# Patient Record
Sex: Female | Born: 1960
Health system: Southern US, Community
[De-identification: ages and names within clinical notes are randomized; demographics above are authoritative.]

## PROBLEM LIST (undated history)

## (undated) DIAGNOSIS — K219 Gastro-esophageal reflux disease without esophagitis: Secondary | ICD-10-CM

## (undated) DIAGNOSIS — D649 Anemia, unspecified: Secondary | ICD-10-CM

## (undated) DIAGNOSIS — Z8659 Personal history of other mental and behavioral disorders: Secondary | ICD-10-CM

## (undated) DIAGNOSIS — K5792 Diverticulitis of intestine, part unspecified, without perforation or abscess without bleeding: Secondary | ICD-10-CM

## (undated) DIAGNOSIS — M1990 Unspecified osteoarthritis, unspecified site: Secondary | ICD-10-CM

## (undated) DIAGNOSIS — M545 Low back pain, unspecified: Secondary | ICD-10-CM

## (undated) DIAGNOSIS — R002 Palpitations: Secondary | ICD-10-CM

## (undated) DIAGNOSIS — C801 Malignant (primary) neoplasm, unspecified: Secondary | ICD-10-CM

## (undated) DIAGNOSIS — I10 Essential (primary) hypertension: Secondary | ICD-10-CM

## (undated) DIAGNOSIS — D759 Disease of blood and blood-forming organs, unspecified: Secondary | ICD-10-CM

## (undated) DIAGNOSIS — I499 Cardiac arrhythmia, unspecified: Secondary | ICD-10-CM

## (undated) HISTORY — PX: ABDOMINAL HYSTERECTOMY: SHX81

## (undated) HISTORY — DX: Low back pain: M54.5

## (undated) HISTORY — PX: COLONOSCOPY: SHX174

## (undated) HISTORY — DX: Diverticulitis of intestine, part unspecified, without perforation or abscess without bleeding: K57.92

## (undated) HISTORY — DX: Personal history of other mental and behavioral disorders: Z86.59

## (undated) HISTORY — DX: Palpitations: R00.2

## (undated) HISTORY — PX: OTHER SURGICAL HISTORY: SHX169

## (undated) HISTORY — DX: Anemia, unspecified: D64.9

## (undated) HISTORY — DX: Low back pain, unspecified: M54.50

---

## 1998-04-06 ENCOUNTER — Ambulatory Visit (HOSPITAL_COMMUNITY): Admission: RE | Admit: 1998-04-06 | Discharge: 1998-04-06 | Payer: Self-pay | Admitting: Internal Medicine

## 1998-10-10 ENCOUNTER — Other Ambulatory Visit: Admission: RE | Admit: 1998-10-10 | Discharge: 1998-10-10 | Payer: Self-pay | Admitting: *Deleted

## 1999-10-12 ENCOUNTER — Observation Stay (HOSPITAL_COMMUNITY): Admission: EM | Admit: 1999-10-12 | Discharge: 1999-10-12 | Payer: Self-pay | Admitting: Emergency Medicine

## 1999-10-12 ENCOUNTER — Encounter: Payer: Self-pay | Admitting: Emergency Medicine

## 1999-10-16 ENCOUNTER — Encounter: Payer: Self-pay | Admitting: *Deleted

## 1999-10-16 ENCOUNTER — Encounter: Admission: RE | Admit: 1999-10-16 | Discharge: 1999-10-16 | Payer: Self-pay | Admitting: *Deleted

## 1999-10-20 ENCOUNTER — Ambulatory Visit (HOSPITAL_COMMUNITY): Admission: RE | Admit: 1999-10-20 | Discharge: 1999-10-20 | Payer: Self-pay | Admitting: Obstetrics and Gynecology

## 1999-10-20 ENCOUNTER — Encounter: Payer: Self-pay | Admitting: Obstetrics and Gynecology

## 1999-12-07 ENCOUNTER — Encounter (INDEPENDENT_AMBULATORY_CARE_PROVIDER_SITE_OTHER): Payer: Self-pay | Admitting: Specialist

## 1999-12-07 ENCOUNTER — Inpatient Hospital Stay (HOSPITAL_COMMUNITY): Admission: RE | Admit: 1999-12-07 | Discharge: 1999-12-09 | Payer: Self-pay | Admitting: Obstetrics and Gynecology

## 2001-08-15 ENCOUNTER — Encounter: Payer: Self-pay | Admitting: Obstetrics and Gynecology

## 2001-08-15 ENCOUNTER — Encounter: Admission: RE | Admit: 2001-08-15 | Discharge: 2001-08-15 | Payer: Self-pay | Admitting: Obstetrics and Gynecology

## 2001-09-18 ENCOUNTER — Encounter: Payer: Self-pay | Admitting: Obstetrics and Gynecology

## 2001-09-18 ENCOUNTER — Ambulatory Visit (HOSPITAL_COMMUNITY): Admission: RE | Admit: 2001-09-18 | Discharge: 2001-09-18 | Payer: Self-pay | Admitting: Obstetrics and Gynecology

## 2002-02-02 ENCOUNTER — Ambulatory Visit (HOSPITAL_COMMUNITY): Admission: RE | Admit: 2002-02-02 | Discharge: 2002-02-02 | Payer: Self-pay | Admitting: Obstetrics and Gynecology

## 2002-02-02 ENCOUNTER — Encounter: Payer: Self-pay | Admitting: Obstetrics and Gynecology

## 2004-05-03 ENCOUNTER — Encounter: Admission: RE | Admit: 2004-05-03 | Discharge: 2004-05-03 | Payer: Self-pay | Admitting: Obstetrics and Gynecology

## 2004-09-09 ENCOUNTER — Emergency Department (HOSPITAL_COMMUNITY): Admission: EM | Admit: 2004-09-09 | Discharge: 2004-09-09 | Payer: Self-pay | Admitting: Emergency Medicine

## 2005-06-11 ENCOUNTER — Encounter: Admission: RE | Admit: 2005-06-11 | Discharge: 2005-06-11 | Payer: Self-pay | Admitting: Obstetrics and Gynecology

## 2005-09-20 ENCOUNTER — Ambulatory Visit (HOSPITAL_COMMUNITY): Admission: RE | Admit: 2005-09-20 | Discharge: 2005-09-20 | Payer: Self-pay | Admitting: Obstetrics and Gynecology

## 2005-11-02 ENCOUNTER — Ambulatory Visit: Payer: Self-pay | Admitting: Internal Medicine

## 2006-02-12 ENCOUNTER — Ambulatory Visit: Payer: Self-pay | Admitting: Internal Medicine

## 2006-06-13 ENCOUNTER — Encounter: Admission: RE | Admit: 2006-06-13 | Discharge: 2006-06-13 | Payer: Self-pay | Admitting: Obstetrics and Gynecology

## 2006-12-02 ENCOUNTER — Ambulatory Visit: Payer: Self-pay | Admitting: Internal Medicine

## 2006-12-02 LAB — CONVERTED CEMR LAB
Albumin: 3.8 g/dL (ref 3.5–5.2)
Alkaline Phosphatase: 61 units/L (ref 39–117)
BUN: 12 mg/dL (ref 6–23)
Basophils Relative: 1.9 % — ABNORMAL HIGH (ref 0.0–1.0)
Cholesterol: 203 mg/dL (ref 0–200)
Crystals: NEGATIVE
GFR calc Af Amer: 138 mL/min
HDL: 55.5 mg/dL (ref >39.0)
Ketones, ur: NEGATIVE mg/dL
Lymphocytes Relative: 32.8 % (ref 12.0–46.0)
Monocytes Relative: 5.3 % (ref 3.0–11.0)
Neutro Abs: 4.2 10*3/uL (ref 1.4–7.7)
Platelets: 461 10*3/uL — ABNORMAL HIGH (ref 150–400)
Potassium: 4.2 meq/L (ref 3.5–5.1)
RBC: 4.28 M/uL (ref 3.87–5.11)
RDW: 12.3 % (ref 11.5–14.6)
Specific Gravity, Urine: 1.025 (ref 1.000–1.03)
TSH: 0.35 microintl units/mL (ref 0.35–5.50)
Total CHOL/HDL Ratio: 3.7
Triglycerides: 56 mg/dL (ref 0–149)
Urine Glucose: NEGATIVE mg/dL
Urobilinogen, UA: 0.2 (ref 0.0–1.0)
VLDL: 11 mg/dL (ref 0–40)
WBC: 7.2 10*3/uL (ref 4.5–10.5)

## 2006-12-05 ENCOUNTER — Ambulatory Visit: Payer: Self-pay | Admitting: Internal Medicine

## 2006-12-09 ENCOUNTER — Ambulatory Visit: Payer: Self-pay | Admitting: Internal Medicine

## 2006-12-17 ENCOUNTER — Ambulatory Visit: Payer: Self-pay | Admitting: Internal Medicine

## 2007-06-23 ENCOUNTER — Encounter: Admission: RE | Admit: 2007-06-23 | Discharge: 2007-06-23 | Payer: Self-pay | Admitting: Obstetrics and Gynecology

## 2007-07-03 ENCOUNTER — Encounter: Admission: RE | Admit: 2007-07-03 | Discharge: 2007-07-03 | Payer: Self-pay | Admitting: Obstetrics and Gynecology

## 2007-09-02 ENCOUNTER — Ambulatory Visit: Payer: Self-pay | Admitting: Internal Medicine

## 2007-09-05 LAB — CONVERTED CEMR LAB
Bilirubin Urine: NEGATIVE
Specific Gravity, Urine: >=1.03 (ref 1.000–1.03)
Squamous Epithelial / LPF: NEGATIVE /lpf
pH: 6 (ref 5.0–8.0)

## 2007-09-17 ENCOUNTER — Ambulatory Visit: Payer: Self-pay | Admitting: Internal Medicine

## 2008-06-24 ENCOUNTER — Encounter: Admission: RE | Admit: 2008-06-24 | Discharge: 2008-06-24 | Payer: Self-pay | Admitting: Obstetrics and Gynecology

## 2008-08-30 ENCOUNTER — Encounter: Admission: RE | Admit: 2008-08-30 | Discharge: 2008-08-30 | Payer: Self-pay | Admitting: *Deleted

## 2009-06-25 DIAGNOSIS — R002 Palpitations: Secondary | ICD-10-CM

## 2009-06-25 HISTORY — DX: Palpitations: R00.2

## 2009-06-27 ENCOUNTER — Encounter: Admission: RE | Admit: 2009-06-27 | Discharge: 2009-06-27 | Payer: Self-pay | Admitting: Obstetrics and Gynecology

## 2010-03-15 ENCOUNTER — Ambulatory Visit: Payer: Self-pay | Admitting: Internal Medicine

## 2010-03-15 LAB — CONVERTED CEMR LAB
ALT: 11 units/L (ref 0–35)
Basophils Relative: 1.2 % (ref 0.0–3.0)
Bilirubin, Direct: 0.1 mg/dL (ref 0.0–0.3)
Chloride: 102 meq/L (ref 96–112)
Cholesterol: 213 mg/dL — ABNORMAL HIGH (ref 0–200)
Creatinine, Ser: 0.5 mg/dL (ref 0.4–1.2)
Eosinophils Absolute: 0.2 10*3/uL (ref 0.0–0.7)
HCT: 36.2 % (ref 36.0–46.0)
Leukocytes, UA: NEGATIVE
Lymphs Abs: 2.5 10*3/uL (ref 0.7–4.0)
MCHC: 34.5 g/dL (ref 30.0–36.0)
MCV: 89.4 fL (ref 78.0–100.0)
Monocytes Absolute: 1.1 10*3/uL — ABNORMAL HIGH (ref 0.1–1.0)
Neutro Abs: 11.6 10*3/uL — ABNORMAL HIGH (ref 1.4–7.7)
Neutrophils Relative %: 74.8 % (ref 43.0–77.0)
Nitrite: NEGATIVE
Potassium: 4.7 meq/L (ref 3.5–5.1)
RBC: 4.05 M/uL (ref 3.87–5.11)
Specific Gravity, Urine: <=1.005 (ref 1.000–1.030)
Total Bilirubin: 0.7 mg/dL (ref 0.3–1.2)
Total CHOL/HDL Ratio: 4
Urobilinogen, UA: 0.2 (ref 0.0–1.0)
pH: 6 (ref 5.0–8.0)

## 2010-03-17 ENCOUNTER — Ambulatory Visit: Payer: Self-pay | Admitting: Internal Medicine

## 2010-03-17 DIAGNOSIS — D72829 Elevated white blood cell count, unspecified: Secondary | ICD-10-CM | POA: Insufficient documentation

## 2010-04-24 ENCOUNTER — Ambulatory Visit: Payer: Self-pay | Admitting: Internal Medicine

## 2010-04-24 LAB — CONVERTED CEMR LAB
Basophils Relative: 0.9 % (ref 0.0–3.0)
Eosinophils Absolute: 0.1 10*3/uL (ref 0.0–0.7)
Eosinophils Relative: 1.2 % (ref 0.0–5.0)
Lymphocytes Relative: 28.7 % (ref 12.0–46.0)
Neutrophils Relative %: 64.2 % (ref 43.0–77.0)
Platelets: 400 10*3/uL (ref 150.0–400.0)
RBC: 4.11 M/uL (ref 3.87–5.11)
WBC: 9.1 10*3/uL (ref 4.5–10.5)

## 2010-04-28 ENCOUNTER — Ambulatory Visit: Payer: Self-pay | Admitting: Internal Medicine

## 2010-05-25 DIAGNOSIS — C4490 Unspecified malignant neoplasm of skin, unspecified: Secondary | ICD-10-CM | POA: Insufficient documentation

## 2010-06-29 ENCOUNTER — Encounter
Admission: RE | Admit: 2010-06-29 | Discharge: 2010-06-29 | Payer: Self-pay | Source: Home / Self Care | Attending: Internal Medicine | Admitting: Internal Medicine

## 2010-07-13 ENCOUNTER — Ambulatory Visit
Admission: RE | Admit: 2010-07-13 | Discharge: 2010-07-13 | Payer: Self-pay | Source: Home / Self Care | Attending: Internal Medicine | Admitting: Internal Medicine

## 2010-07-13 DIAGNOSIS — M25519 Pain in unspecified shoulder: Secondary | ICD-10-CM | POA: Insufficient documentation

## 2010-07-13 DIAGNOSIS — S20219A Contusion of unspecified front wall of thorax, initial encounter: Secondary | ICD-10-CM | POA: Insufficient documentation

## 2010-07-16 ENCOUNTER — Encounter: Payer: Self-pay | Admitting: Obstetrics and Gynecology

## 2010-07-16 ENCOUNTER — Encounter: Payer: Self-pay | Admitting: Podiatry
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
## 2010-07-27 NOTE — Assessment & Plan Note (Signed)
Summary: 6 wk f/u /cd   Vital Signs:  Patient profile:   50 year old female Height:      68 inches Weight:      177 pounds BMI:     27.01 Temp:     98.7 degrees F oral Pulse rate:   72 / minute Pulse rhythm:   regular Resp:     16 per minute BP sitting:   120 / 70  (left arm) Cuff size:   regular  Vitals Entered By: Lanier Prude, CMA(AAMA) (April 28, 2010 3:21 PM) CC: 6 wk f/u Is Patient Diabetic? No   CC:  6 wk f/u.  History of Present Illness: F/u elev WBC  Allergies: 1)  ! Pcn 2)  * Fish Oil  Past History:  Past Medical History: Last updated: 03/17/2010 Low back pain h/o bulimia Palpitations 2011 [facility] Dr Mysinger  Social History: Last updated: 03/17/2010 Occupation: [employer] - works from home Married 2 children Regular exercise-: no Never Smoked  Review of Systems  The patient denies fever, chest pain, dyspnea on exertion, and abdominal pain.    Physical Exam  General:  Well-developed,well-nourished,in no acute distress; alert,appropriate and cooperative throughout examination Mouth:  Oral mucosa and oropharynx without lesions or exudates.  Teeth in good repair. Abdomen:  Bowel sounds positive,abdomen soft and non-tender without masses, organomegaly or hernias noted. Msk:  No deformity or scoliosis noted of thoracic or lumbar spine.   Neurologic:  No cranial nerve deficits noted. Station and gait are normal. Plantar reflexes are down-going bilaterally. DTRs are symmetrical throughout. Sensory, motor and coordinative functions appear intact.   Impression & Recommendations:  Problem # 1:  LEUKOCYTOSIS (ICD-288.60) resolved Assessment Improved The labs were reviewed with the patient.   Complete Medication List: 1)  Vitamin D3 1000 Unit Tabs (Cholecalciferol) .[name].. 1 by mouth daily 2)  Atenolol 50 Mg Tabs (Atenolol) .[name].. 1 once daily  Patient Instructions: 1)  Please schedule a follow-up appointment in 1 year well  w/labs.   Orders Added: 1)  Est. Patient Level III [11914]

## 2010-07-27 NOTE — Assessment & Plan Note (Signed)
Summary: PHYSICAL--STC   Vital Signs:  Patient profile:   50 year old female Height:      68 inches Weight:      178 pounds BMI:     27.16 Pulse rate:   76 / minute Pulse rhythm:   regular Resp:     16 per minute BP sitting:   110 / 80  (left arm) Cuff size:   regular  Vitals Entered By: Lanier Prude, Beverly Gust) (March 17, 2010 2:22 PM) CC: CPX Is Patient Diabetic? No Comments pt had EKG recently.     CC:  CPX.  History of Present Illness: The patient presents for a preventive health examination She went to UC once 6 months ago for palpitations and was put on Atenolol. Then she went to see an [facility] cardiologist and had an ECHO - it was nl per pt.   Current Medications (verified): 1)  Naprosyn 500 Mg Tabs (Naproxen) .[name].. 1 Two Times A Day Pc Prn 2)  Vitamin D3 1000 Unit  Tabs (Cholecalciferol) .[name].. 1 By Mouth Daily 3)  Vitamin E 1000 Unit Caps (Vitamin E) .[name].. 1 Once Daily 4)  Calcium 500+d 500-200 Mg-Unit Tabs (Calcium Carbonate-Vitamin D) .[name].. 1 Once Daily 5)  Fish Oil 1000 Mg Caps (Omega-3 Fatty Acids) .[name].. 1 Once Daily 6)  Atenolol 50 Mg Tabs (Atenolol) .[name].. 1 Once Daily  Allergies (verified): 1)  ! Pcn 2)  * Fish Oil  Past History:  Family History: Last updated: 09/02/2007 Family History Hypertension M - thyroid CA  Past Medical History: Low back pain h/o bulimia Palpitations 2011 [facility] Dr Mysinger  Past Surgical History: Hysterectomy partial Colonoscopy Dr Madilyn Fireman 2011 nl  Social History: Occupation: [employer] - works from home Married 2 children Regular exercise-: no Never Smoked  Review of Systems  The patient denies anorexia, fever, weight loss, weight gain, vision loss, decreased hearing, hoarseness, chest pain, syncope, dyspnea on exertion, peripheral edema, prolonged cough, headaches, hemoptysis, abdominal pain, melena, hematochezia, severe indigestion/heartburn, hematuria, incontinence, genital sores, muscle weakness,  suspicious skin lesions, transient blindness, difficulty walking, depression, unusual weight change, abnormal bleeding, enlarged lymph nodes, angioedema, and breast masses.    Physical Exam  General:  Well-developed,well-nourished,in no acute distress; alert,appropriate and cooperative throughout examination Head:  Normocephalic and atraumatic without obvious abnormalities. No apparent alopecia or balding. Eyes:  No corneal or conjunctival inflammation noted. EOMI. Perrla. Ears:  External ear exam shows no significant lesions or deformities.  Otoscopic examination reveals clear canals, tympanic membranes are intact bilaterally without bulging, retraction, inflammation or discharge. Hearing is grossly normal bilaterally. Nose:  External nasal examination shows no deformity or inflammation. Nasal mucosa are pink and moist without lesions or exudates. Mouth:  Oral mucosa and oropharynx without lesions or exudates.  Teeth in good repair. Neck:  No deformities, masses, or tenderness noted. Lungs:  Normal respiratory effort, chest expands symmetrically. Lungs are clear to auscultation, no crackles or wheezes. Heart:  Normal rate and regular rhythm. S1 and S2 normal without gallop, murmur, click, rub or other extra sounds. Abdomen:  Bowel sounds positive,abdomen soft and non-tender without masses, organomegaly or hernias noted. Msk:  No deformity or scoliosis noted of thoracic or lumbar spine.   Extremities:  No clubbing, cyanosis, edema, or deformity noted with normal full range of motion of all joints.   Neurologic:  No cranial nerve deficits noted. Station and gait are normal. Plantar reflexes are down-going bilaterally. DTRs are symmetrical throughout. Sensory, motor and coordinative functions appear  intact. Skin:  Intact without suspicious lesions or rashes Cervical Nodes:  No lymphadenopathy noted Inguinal Nodes:  No significant adenopathy Psych:  Cognition and judgment appear intact. Alert and  cooperative with normal attention span and concentration. No apparent delusions, illusions, hallucinations   Impression & Recommendations:  Problem # 1:  HEALTH MAINTENANCE EXAM (ICD-V70.0) Assessment New Health and age related issues were discussed. Available screening tests and vaccinations were discussed as well. Healthy life style including good diet and exercise was discussed. The labs were reviewed with the patient. Colonosc. 2011 and mammo  q1-2 years discussed  Problem # 2:  LEUKOCYTOSIS (ICD-288.60) unclear etiol Assessment: New Recheck soon Orders: T-2 View CXR, Same Day (71020.5TC)  Complete Medication List: 1)  Vitamin D3 1000 Unit Tabs (Cholecalciferol) .[name].. 1 by mouth daily 2)  Atenolol 50 Mg Tabs (Atenolol) .[name].. 1 once daily 3)  Zithromax Z-pak 250 Mg Tabs (Azithromycin) .... As dirrected  Other Orders: Admin 1st Vaccine (04540) Flu Vaccine 51yrs + (98119)  Patient Instructions: 1)  Please schedule a follow-up appointment in 6 weeks. 2)  CBC w/ Diff prior to visit, ICD-9:995.20 Prescriptions: ATENOLOL 50 MG TABS (ATENOLOL) 1 once daily  #30 x 12   Entered and Authorized by:   Tresa Garter MD   Signed by:   Tresa Garter MD on 03/17/2010   Method used:   Print then Give to Patient   RxID:   [id number] QIONGEXBM Z-PAK 250 MG TABS (AZITHROMYCIN) as dirrected  #1 x 0   Entered and Authorized by:   Tresa Garter MD   Signed by:   Tresa Garter MD on 03/17/2010   Method used:   Print then Give to Patient   RxID:   [id number]    Preventive Care Screening  Colonoscopy:    Date:  12/23/2009    Results:  normal   .lbflu   Flu Vaccine Consent Questions     Do you have a history of severe allergic reactions to this vaccine? no    Any prior history of allergic reactions to egg and/or gelatin? no    Do you have a sensitivity to the preservative Thimersol? no    Do you have a past history of Guillan-Barre Syndrome? no    Do you  currently have an acute febrile illness? no    Have you ever had a severe reaction to latex? no    Vaccine information given and explained to patient? yes    Are you currently pregnant? no    Lot Number:[id number]   Exp Date:12/23/2010   Site Given  Left Deltoid IM Lanier Prude, [facility])  March 17, 2010 2:33 PM

## 2010-11-07 NOTE — Assessment & Plan Note (Signed)
[facility]                           PRIMARY CARE OFFICE NOTE   NAME:Sheila Lin, Sheila Lin                   MRN:          [id number]  DATE:12/05/2006                            DOB:          [birth year]    PROCEDURE NOTE:   PROCEDURE:  Laceration repair.   INDICATIONS:  Blunt injury to the right forefoot, dorsal side, against  third toe, resulting in skin laceration.   The risks, including infection, scar formation, bleeding and others, as  well as benefits, were explained to the patient in detail.  She agreed  to proceed.  The skin was treated with Betadine and alcohol and the  wound was injected with 3 mL of 2% lidocaine with epinephrine.  After  adequate anesthesia was obtained, the wound was copiously irrigated with  anesthetic solution through the syringe and debrided manually with  sterile gauze.  After that, the wound was prepped with Betadine and  draped with a fenestrated sterile dressing.  Two 3-0 Ethilon sutures  were applied to hold the flap to the rest of the skin.  The base of the  wound did not show any tendons or other structures, except for  subcutaneous tissue visible.  The wound was closed with simple sutures.  Applied a large Band-Aid, gauze and Ace wrap.   Wound instructions were provided.  She will apply Silvadene cream when  she gets to the drug store.  Tetanus shot given.   Followup in twelve days to remove sutures.  Call if problems.     [name]. [name], MD  Electronically Signed    AVP/MedQ  DD: 12/06/2006  DT: 12/06/2006  Job #: [id number]

## 2010-11-07 NOTE — Assessment & Plan Note (Signed)
[facility]                           PRIMARY CARE OFFICE NOTE   NAME:Sheila Lin, Sheila Lin                   MRN:          [id number]  DATE:12/09/2006                            DOB:          [birth year]    The patient is a 50 year old female who presents for a wellness  examination.   PAST MEDICAL HISTORY:  As per February 03, 2002 note.   FAMILY HISTORY:  As per February 03, 2002 note.   SOCIAL HISTORY:  As per February 03, 2002 note.  She has been playing in a  baseball league for a long time, weekly practices and games.  She is an  empty nester now, going through some midlife crisis problems.   ALLERGIES:  PENICILLIN.   REVIEW OF SYSTEMS:  Right foot is doing better after injuring it.  Denies being depressed.  States that she is somewhat bored.  No chest  pain or shortness of breath.  No neurologic complaints.  Denies  insomnia.  No hot flashes.  The rest of the 18-point review of systems  is negative.   PHYSICAL EXAMINATION:  Blood pressure 123/73.  Pulse 74.  Temperature  99.7.  Weight is 186 pounds.  She is looks well.  She is no acute distress.  HEENT:  With moist mucosa.  NECK:  Supple.  No thyromegaly or bruit.  LUNGS:  Clear to auscultation and percussion.  No wheezing or rales.  HEART:  No murmur.  No gallop.  ABDOMEN:  Soft and non-tender.  No organomegaly or mass felt.  LOWER EXTREMITIES:  Without edema.  She is alert and appropriate.  Denies being depressed.  RIGHT FOOT:  Examination reveals a repaired laceration healing well.  Swelling is down.  Pulse is normal.  NEUROLOGIC EXAM:  Nonfocal.  Normal.  Labs on December 02, 2006:  CBC normal.  CMET normal.  Cholesterol 203, HDL  55.5, LDL 128.  Urinalysis normal.  X-ray of the foot without fracture.  TSH normal.  EKG today was normal sinus rhythm.   ASSESSMENT AND PLAN:  1. Normal wellness examination.  Age/health issues discussed.  Healthy      lifestyle discussed.  Continue with  baseball when the foot feels      better.  She has been seeing a dermatologist twice a year to check      on her moles.  GYN checkups yearly.  Mammogram yearly.  All      vaccinations are up to date.  She can take vitamin D 1000 mg daily      during the winter.      Counselling re: midlife issues adviced.  I will see her back in      12 months.  2. Contusion/laceration right foot.  She will be here next week for      suture removal.     Aleksei V. [name], MD  Electronically Signed    AVP/MedQ  DD: 12/10/2006  DT: 12/10/2006  Job #: [id number]

## 2010-11-10 NOTE — H&P (Signed)
[facility]  Patient:    Sheila Lin, Sheila Lin                   MRN: [id number] Adm. Date:  [id number] Disc. Date: [id number] Attending:  Heber [place]                         History and Physical  CHIEF COMPLAINT:  Symptomatic leiomyomata uterus.  HISTORY OF PRESENT ILLNESS:  This is a 50 year old white female gravida 3, para 2-0-1-2 with a last menstrual period of late May whom I saw in April as a new patient. At that time she complained of since August of 1999 having heavy crampy menses. She was told in October 1999 that she had 3 fibroids and her bleeding and cramping have continued since then. She had a D&C performed April of 2000 which corrected intermenstrual bleeding but she still had heavy crampy menses. She has been on oral contraceptives for approximately 12 years without improvement. She now has heavy unpredictable bleeding with significant cramping and has had no relief from over the counter Midol, ibuprofen or Aleve. The patient works at home and says that she would miss work if she did not work at home and she has to carry pads at all times due to the unpredictable bleeding. The bleeding and pain interferes with her daily activities including playing softball, camping and other daily activities. She also complains of a vaginal discharge with odor and dyspareunia for 6-7 months.  Preop evaluation included an ultrasound which revealed multiple uterine fibroids. The largest is posterior near the fundus on the right and measures 4 x 5 x 5 cm. She has another prominent fibroid on the left measuring 4 x 5 x 5 cm. A smaller fibroid in the anterolateral aspect of the uterus measures 2.7 cm and a fourth fibroid measuring 1.7 cm. The left ovary was normal. The right ovary was not identified. The patient is admitted for definitive surgical therapy.  PAST OB HISTORY:  In 1981, vaginal delivery 8 pounds 8 ounces without complications. In  1998, she had an ectopic pregnancy treated with a salpingectomy through a laparotomy. She has 1 other vaginal delivery without complications.  PAST MEDICAL HISTORY:  Negative.  PAST SURGICAL HISTORY:  Significant for salpingectomy in 1998 for her ectopic pregnancy. She is unsure which side. She had the [facility] in May of 2000.  CURRENT MEDICATIONS:  Loestrin 1.530 and iron.  ALLERGIES:  PENICILLIN  which causes hives and a rash.  GYN HISTORY:  No significant history. No abnormal Pap smears.  REVIEW OF SYSTEMS:  Otherwise negative.  FAMILY HISTORY:  Noncontributory.  PHYSICAL EXAMINATION:  VITAL SIGNS:  Weight is 178 pounds, blood pressure was 110/70.  GENERAL:  She is a well-developed, well-nourished, white female who is in no acute distress.  HEENT:  Pupils equal round and reactive to light and accommodation and her extraocular muscles are intact. Oropharynx is clear without erythema or exudates.  NECK:  Supple without lymphadenopathy or thyromegaly.  LUNGS:  Clear to auscultation.  HEART:  Regular rate and rhythm without murmurs.  ABDOMEN:  Soft, nontender and she has a well healed transverse scar. She does have myomas palpable just above the pubic symphysis.  EXTREMITIES:  No edema, are nontender and DTRs are 2/4 and symmetric.  PELVIC:  External genitalia revealed no lesions. On bimanual exam, she has an enlarged irregular uterus with palpable fibroids at the right fundus.  ASSESSMENT:  Symptomatic  leiomyomata uterus. Medical and surgical options have been discussed with the patient. She has not responded to oral contraceptives or nonsteroidal anti-inflammatories. The patient desires definitive surgical therapy. Risks of surgery including bleeding, infection, and damage to surrounding organs have been discussed. We have also elected to preserve her ovaries unless they appear abnormal.  PLAN:  Admit the patient on the day of surgery for a total abdominal hysterectomy  with possible bilateral salpingo-oophorectomy. DD:  12/06/99 TD:  12/06/99 Job: [id number]

## 2010-11-10 NOTE — Discharge Summary (Signed)
[facility]  Patient:    Sheila Lin, Sheila Lin                   MRN: [id number] Adm. Date:  [id number] Disc. Date: [id number] Attending:  Michaele Offer                           Discharge Summary  ADMISSION DIAGNOSIS:  Symptomatic leiomyomatous uterus.  DISCHARGE DIAGNOSIS:  Symptomatic leiomyomatous uterus.  PROCEDURE PERFORMED:  Total abdominal hysterectomy.  COMPLICATIONS:  None.  CONSULTATIONS:  None.  HISTORY OF PRESENT ILLNESS:  Briefly, this is a 50 year old white female gravida 3, para 2-0-1-2 with irregular heavy periods and significant menstrual cramping which has not responded to medical therapy.  She has an enlarged lieomyomatous uterus and is admitted for definitive surgical therapy.  PAST MEDICAL HISTORY:  Past history is significant for two vaginal deliveries and one ectopic pregnancy.  The ectopic pregnancy was treated with a left salpingectomy via a laparotomy.  She has also had a D&C for her abnormal bleeding.  PHYSICAL EXAMINATION:  Physical examination was significant for benign abdomen with a well-healed transverse scar.  She had a 10-weeks size enlarged, irregular uterus with palpable fibroids.  HOSPITAL COURSE:  The patient was admitted on the day of surgery and underwent a total abdominal hysterectomy under general anesthesia.  Estimated blood loss was 200 cc.  She did have an enlarged, irregular, myomatous uterus with normal ovaries and evidence of a previous left salpingectomy.  Postoperatively she did very well, remained afebrile and was rapidly ambulated.  She tolerated liquids, followed by a regular diet.  She did have some problems with tolerating Percocet, but was able to tolerate this on the morning of discharge.  On postoperative day #2, her incision was healing well and at this time she was felt to be stable enough for discharge home.  Preoperative hemoglobin was 11.0, postoperative hemoglobin was  9.8.  CONDITION ON DISCHARGE:  Stable.  DISPOSITION:  Discharged to home.  DISCHARGE INSTRUCTIONS:  Her diet is a regular diet.  Her activity is pelvic rest, no strenuous activity, no driving.  Followup is in approximately 4 days for staple removal.  DISCHARGE MEDICATIONS:  Percocet p.r.n. pain. DD:  12/09/99 TD:  12/12/99 Job: [id number]

## 2010-11-10 NOTE — H&P (Signed)
[place]  Patient:    Sheila Lin, Sheila Lin                   MRN: [id number] Adm. Date:  [id number] Attending:  Heber Cobden                         History and Physical  CHIEF COMPLAINT:  Chest pain.  HISTORY OF PRESENT ILLNESS:  This 50 year old white female states she was awakened from sleep with left arm numbness and pain as well as chest pain associated with shortness of breath.  She states the symptoms lasted approximately 10 to 15 minutes before she was brought to the hospital for evaluation.  Once in the hospital, she was seen and evaluated by the emergency room physician, who felt admission was warranted to rule out cardiac event.  Patient denies any past history of any cardiac disease, hypertension, diabetes or GI problems.  PAST FAMILY MEDICAL HISTORY:  The patient was born and raised in [place]. Mother and father are alive and well and have no history of cardiac disease. She has one sister who is healthy.  Patient is married and has two children.  SOCIAL HISTORY:  The patient does not drink or smoke.  PAST SURGICAL HISTORY:  D&C and ectopic pregnancy.  MEDICATIONS AT PRESENT:  Birth control pills.  ALLERGIES:  PENICILLIN.  LOCAL PHYSICIAN:  Dr. Rosalyn Gess. Norins.  REVIEW OF SYSTEMS:  HEENT, GI, GU, cardiac and pulmonary reviews all negative.  GYN: It is known that the patient has fibroids and is followed by Dr. Roberto Scales.  PHYSICAL EXAMINATION:  VITAL SIGNS:  Blood pressure is 136/70.  Pulse is 80.  Respirations 16.  GENERAL APPEARANCE:  Middle-aged white female in no acute distress, sitting on he emergency room stretcher.  HEENT:  Head was normocephalic and atraumatic.  Eyes:  Conjunctivae were clear.  Pupils equal, round and reactive to light.  Nose:  Moist patent passageways. Pharynx is clear.  Ears:  Canals clear.  TMs gray.  NECK:  Supple.  No adenopathy or thyromegaly.  No nodules.  CHEST:   Totally clear to auscultation.  HEART:  Regular rhythm.  No murmurs.  Normal S1 and S2.  No pain to palpation.   ABDOMEN:  Negative.  EXTREMITIES:  No edema.  NEUROLOGIC:  Patients reflexes are flat.  She moves all extremities well.  BREASTS:  Deferred.  PELVIC:  Deferred.  RECTAL:  Deferred.  LABORATORY FINDINGS:  Labs are pending.  EKG is totally within normal limits.  ASSESSMENT:  Chest pain, rule out cardiac versus gastrointestinal or musculoskeletal.  PLAN:  Patient will be admitted.  Cardiac isoenzymes x 3 at eight-hour intervals. DD:  10/12/99 TD:  10/12/99 Job: [id number]

## 2010-11-10 NOTE — Discharge Summary (Signed)
[place]  Patient:    Sheila Lin, Sheila Lin                   MRN: [id number] Adm. Date:  [id number] Disc. Date: [id number] Attending:  Heber Chiloquin Dictator:   Cornell Barman, P.A. CC:         Rosalyn Gess. Norins, M.D. [facility]                           Discharge Summary  BRIEF ADMISSION HISTORY:  The patient is a 50 year old white female that presented after awakening from sleep with pain in her left arm, shortness of breath and chest pressure.  The symptoms lasted 10-15 minutes.  She was brought to the emergency room for evaluation.  She denied any prior cardiac problems.  She denies any history of hypertension or GI pathology.  Her only medication was birth control pills.  The patient was admitted to rule out myocardial infarction.  HOSPITAL COURSE:  Myocardial infarction was ruled out with negative cardiac enzymes.  The patient was discharged to home.  She was instructed to follow up Monday, May 23 at 1:00 p.m. for a stress Cardiolite.  She was instructed to resume her prior home medications. DD:  10/23/99 TD:  10/24/99 Job: [id number]

## 2010-11-10 NOTE — Op Note (Signed)
[facility]  Patient:    Sheila Lin, Sheila Lin                   MRN: [id number] Proc. Date: 12/07/99 Adm. Date:  [id number] Attending:  Michaele Offer                           Operative Report  PREOPERATIVE DIAGNOSIS:  Symptomatic leiomyomata uterus.  POSTOPERATIVE DIAGNOSIS:  Symptomatic leiomyomata uterus.  PROCEDURE:  Total abdominal hysterectomy.  SURGEON:  Zenaida Niece, M.D.  ASSISTANT:  Huel Cote, M.D. and Michael Litter, P.A. (student).  ANESTHESIA:  General endotracheal tube.  ESTIMATED BLOOD LOSS:  200 cc.  CHEMOPROPHYLAXIS:  Ancef 1 gm preop.  FINDINGS:  Enlarged irregular myomatous uterus with several myomas. She had normal appearing ovaries and evidence of a previous left salpingectomy from an ectopic pregnancy.  COUNTS:  Correct.  CONDITION:  Stable.  DESCRIPTION OF PROCEDURE:  After appropriate informed consent was obtained, the patient was taken in the operating room and placed in the dorsal supine position. General anesthesia was induced and she was prepped and draped in the usual sterile fashion and a Foley catheter inserted. Her abdomen was then entered via her previous Pfannenstiel scar. A self retaining retractor was placed and the bowels were packed out of the pelvis. The uterus again was noted to be enlarged and irregular with several palpable fibroids. The uterine cornu were grasped with Kelly clamps. The round ligaments were divided with electrocautery and the utero-ovarian pedicles were clamped with Zeppelin clamps. These were then transected and doubly ligated with #1 chromic to free up the ovaries. The broad ligaments were transected with electrocautery and taken across the anterior portion of the uterus and the bladder was pushed inferiorly. The uterine arteries were skeletonized, clamped, transected and ligated with #1 chromic. Likewise the cardinal ligaments and uterosacral ligaments were  clamped, transected and ligated with #1 chromic. The vaginal angles were then clamped with Zeppelin clamps and the vagina was entered sharply. The vagina was cut with scissors to remove the uterus and cervix. The vaginal angles were sutured with #1 chromic and the remainder of the vagina was closed in a vertical fashion from anterior to posterior with running locking #1 chromic with adequate hemostasis. The pelvis was then irrigated and found to be hemostatic. The uterosacral ligaments were plicated in the midline with a suture of 2-0 silk. All sites were inspected and found to be hemostatic. The lap pad was removed from packing the bowel out of the abdomen and all instruments removed. The subfascial space was then irrigated and made hemostatic with electrocautery. The fascia was closed in a running fashion starting at both ends and meeting in the middle with #0 Vicryl. The subcutaneous tissue was irrigated and made hemostatic with electrocautery and the skin was closed with staples followed by a sterile dressing. The patient was extubated in the operating room, tolerated the procedure well and taken to the recovery room in stable condition. DD:  12/07/99 TD:  12/09/99 Job: [id number]

## 2011-02-20 ENCOUNTER — Other Ambulatory Visit: Payer: Self-pay | Admitting: *Deleted

## 2011-02-20 DIAGNOSIS — Z0000 Encounter for general adult medical examination without abnormal findings: Secondary | ICD-10-CM

## 2011-03-23 ENCOUNTER — Other Ambulatory Visit (INDEPENDENT_AMBULATORY_CARE_PROVIDER_SITE_OTHER): Payer: Self-pay

## 2011-03-23 DIAGNOSIS — Z0000 Encounter for general adult medical examination without abnormal findings: Secondary | ICD-10-CM

## 2011-03-23 LAB — BASIC METABOLIC PANEL
BUN: 9 mg/dL (ref 6–23)
Calcium: 9.2 mg/dL (ref 8.4–10.5)
Creatinine, Ser: 0.5 mg/dL (ref 0.4–1.2)
GFR: 131.76 mL/min (ref >60.00)
Glucose, Bld: 94 mg/dL (ref 70–99)
Potassium: 4.3 mEq/L (ref 3.5–5.1)

## 2011-03-23 LAB — CBC WITH DIFFERENTIAL/PLATELET
Basophils Absolute: 0 10*3/uL (ref 0.0–0.1)
Eosinophils Relative: 1.3 % (ref 0.0–5.0)
HCT: 34 % — ABNORMAL LOW (ref 36.0–46.0)
Lymphocytes Relative: 30.9 % (ref 12.0–46.0)
Lymphs Abs: 2.9 10*3/uL (ref 0.7–4.0)
Monocytes Relative: 5.4 % (ref 3.0–12.0)
Platelets: 314 10*3/uL (ref 150.0–400.0)
RDW: 13.1 % (ref 11.5–14.6)
WBC: 9.3 10*3/uL (ref 4.5–10.5)

## 2011-03-23 LAB — TSH: TSH: 0.43 u[IU]/mL (ref 0.35–5.50)

## 2011-03-23 LAB — LIPID PANEL
Cholesterol: 182 mg/dL (ref 0–200)
HDL: 64.1 mg/dL (ref >39.00)
LDL Cholesterol: 110 mg/dL — ABNORMAL HIGH (ref 0–99)
Triglycerides: 42 mg/dL (ref 0.0–149.0)
VLDL: 8.4 mg/dL (ref 0.0–40.0)

## 2011-03-23 LAB — URINALYSIS, ROUTINE W REFLEX MICROSCOPIC
Nitrite: NEGATIVE
Specific Gravity, Urine: <=1.005 (ref 1.000–1.030)
Total Protein, Urine: NEGATIVE
Urine Glucose: NEGATIVE
Urobilinogen, UA: 0.2 (ref 0.0–1.0)

## 2011-03-23 LAB — HEPATIC FUNCTION PANEL
AST: 15 U/L (ref 0–37)
Albumin: 4.1 g/dL (ref 3.5–5.2)
Alkaline Phosphatase: 48 U/L (ref 39–117)

## 2011-03-28 ENCOUNTER — Encounter: Payer: Self-pay | Admitting: Internal Medicine

## 2011-03-30 ENCOUNTER — Encounter: Payer: Self-pay | Admitting: Internal Medicine

## 2011-03-30 ENCOUNTER — Ambulatory Visit (INDEPENDENT_AMBULATORY_CARE_PROVIDER_SITE_OTHER): Payer: Commercial Managed Care - PPO | Admitting: Internal Medicine

## 2011-03-30 VITALS — BP 120/70 | HR 80 | Temp 98.6°F | Resp 16 | Ht 69.0 in | Wt 160.0 lb

## 2011-03-30 DIAGNOSIS — D649 Anemia, unspecified: Secondary | ICD-10-CM | POA: Insufficient documentation

## 2011-03-30 DIAGNOSIS — Z0000 Encounter for general adult medical examination without abnormal findings: Secondary | ICD-10-CM | POA: Insufficient documentation

## 2011-03-30 MED ORDER — ATENOLOL 50 MG PO TABS: 50.0000 mg | ORAL_TABLET | Freq: Every day | ORAL | Status: DC

## 2011-03-30 NOTE — Assessment & Plan Note (Signed)
We discussed age appropriate health related issues, including available/recomended screening tests and vaccinations. We discussed a need for adhering to healthy diet and exercise. Labs/EKG were reviewed/ordered. All questions were answered.   

## 2011-03-30 NOTE — Progress Notes (Signed)
  Subjective:    Patient ID: Sheila Lin, female    DOB: Dec 28, 1960, 50 y.o.   MRN: [id number]  HPI  The patient is here for a wellness exam. The patient has been doing well overall without major physical or psychological issues going on lately.Review of Systems  Constitutional: Negative for fever, chills, diaphoresis, activity change, appetite change, fatigue and unexpected weight change.  HENT: Negative for hearing loss, ear pain, congestion, sore throat, sneezing, mouth sores, neck pain, dental problem, voice change, postnasal drip and sinus pressure.   Eyes: Negative for pain and visual disturbance.  Respiratory: Negative for cough, chest tightness, wheezing and stridor.   Cardiovascular: Negative for chest pain, palpitations and leg swelling.  Gastrointestinal: Negative for nausea, vomiting, abdominal pain, blood in stool, abdominal distention and rectal pain.  Genitourinary: Negative for dysuria, hematuria, decreased urine volume, vaginal bleeding, vaginal discharge, difficulty urinating, vaginal pain and menstrual problem.  Musculoskeletal: Negative for back pain, joint swelling and gait problem.  Skin: Negative for color change, rash and wound.  Neurological: Negative for dizziness, tremors, syncope, speech difficulty and light-headedness.  Hematological: Negative for adenopathy.  Psychiatric/Behavioral: Negative for suicidal ideas, hallucinations, behavioral problems, confusion, sleep disturbance, dysphoric mood and decreased concentration. The patient is not hyperactive.        Objective:   Physical Exam  Constitutional: She appears well-developed and well-nourished. No distress.  HENT:  Head: Normocephalic.  Right Ear: External ear normal.  Left Ear: External ear normal.  Nose: Nose normal.  Mouth/Throat: Oropharynx is clear and moist.  Eyes: Conjunctivae are normal. Pupils are equal, round, and reactive to light. Right eye exhibits no discharge. Left eye exhibits no  discharge.  Neck: Normal range of motion. Neck supple. No JVD present. No tracheal deviation present. No thyromegaly present.  Cardiovascular: Normal rate, regular rhythm and normal heart sounds.   Pulmonary/Chest: No stridor. No respiratory distress. She has no wheezes.  Abdominal: Soft. Bowel sounds are normal. She exhibits no distension and no mass. There is no tenderness. There is no rebound and no guarding.  Musculoskeletal: She exhibits no edema and no tenderness.  Lymphadenopathy:    She has no cervical adenopathy.  Neurological: She displays normal reflexes. No cranial nerve deficit. She exhibits normal muscle tone. Coordination normal.  Skin: No rash noted. No erythema.  Psychiatric: She has a normal mood and affect. Her behavior is normal. Judgment and thought content normal.     Lab Results  Component Value Date   WBC 9.3 03/23/2011   HGB 11.2* 03/23/2011   HCT 34.0* 03/23/2011   PLT 314.0 03/23/2011   GLUCOSE 94 03/23/2011   CHOL 182 03/23/2011   TRIG 42.0 03/23/2011   HDL 64.10 03/23/2011   LDLDIRECT 156.2 03/15/2010   LDLCALC 110* 03/23/2011   ALT 14 03/23/2011   AST 15 03/23/2011   NA 140 03/23/2011   K 4.3 03/23/2011   CL 105 03/23/2011   CREATININE 0.5 03/23/2011   BUN 9 03/23/2011   CO2 29 03/23/2011   TSH 0.43 03/23/2011        Assessment & Plan:

## 2011-03-30 NOTE — Assessment & Plan Note (Addendum)
Continue with current prescription therapy as reflected on the Med list. Labs sooner if she starts to tired etc Hem cards given - now

## 2011-07-18 ENCOUNTER — Other Ambulatory Visit: Payer: Self-pay | Admitting: Obstetrics and Gynecology

## 2011-07-18 DIAGNOSIS — Z1231 Encounter for screening mammogram for malignant neoplasm of breast: Secondary | ICD-10-CM

## 2011-07-27 ENCOUNTER — Ambulatory Visit: Payer: Commercial Managed Care - PPO

## 2011-08-22 ENCOUNTER — Ambulatory Visit
Admission: RE | Admit: 2011-08-22 | Discharge: 2011-08-22 | Disposition: A | Discharge status: Home / Self Care | Payer: Commercial Managed Care - PPO | Source: Ambulatory Visit | Attending: Obstetrics and Gynecology | Admitting: Obstetrics and Gynecology

## 2011-08-22 DIAGNOSIS — Z1231 Encounter for screening mammogram for malignant neoplasm of breast: Secondary | ICD-10-CM

## 2012-04-04 ENCOUNTER — Other Ambulatory Visit: Payer: Self-pay | Admitting: Internal Medicine

## 2012-04-04 DIAGNOSIS — Z0000 Encounter for general adult medical examination without abnormal findings: Secondary | ICD-10-CM

## 2012-04-10 ENCOUNTER — Other Ambulatory Visit: Payer: Self-pay | Admitting: Internal Medicine

## 2012-05-27 ENCOUNTER — Other Ambulatory Visit (INDEPENDENT_AMBULATORY_CARE_PROVIDER_SITE_OTHER): Payer: Commercial Managed Care - PPO

## 2012-05-27 DIAGNOSIS — Z0000 Encounter for general adult medical examination without abnormal findings: Secondary | ICD-10-CM

## 2012-05-27 LAB — BASIC METABOLIC PANEL
BUN: 18 mg/dL (ref 6–23)
GFR: 147.39 mL/min (ref >60.00)
Glucose, Bld: 105 mg/dL — ABNORMAL HIGH (ref 70–99)
Potassium: 3.5 mEq/L (ref 3.5–5.1)

## 2012-05-27 LAB — URINALYSIS, ROUTINE W REFLEX MICROSCOPIC
Leukocytes, UA: NEGATIVE
Nitrite: NEGATIVE
Specific Gravity, Urine: 1.015 (ref 1.000–1.030)
Urobilinogen, UA: 0.2 (ref 0.0–1.0)
pH: 6 (ref 5.0–8.0)

## 2012-05-27 LAB — LIPID PANEL
HDL: 58 mg/dL (ref >39.00)
LDL Cholesterol: 121 mg/dL — ABNORMAL HIGH (ref 0–99)
Total CHOL/HDL Ratio: 3
VLDL: 12.8 mg/dL (ref 0.0–40.0)

## 2012-05-27 LAB — CBC WITH DIFFERENTIAL/PLATELET
Eosinophils Relative: 0.7 % (ref 0.0–5.0)
Lymphocytes Relative: 25.8 % (ref 12.0–46.0)
MCV: 92.1 fl (ref 78.0–100.0)
Monocytes Absolute: 0.5 10*3/uL (ref 0.1–1.0)
Monocytes Relative: 5.9 % (ref 3.0–12.0)
Neutrophils Relative %: 66.8 % (ref 43.0–77.0)
Platelets: 344 10*3/uL (ref 150.0–400.0)
WBC: 8.7 10*3/uL (ref 4.5–10.5)

## 2012-05-27 LAB — HEPATIC FUNCTION PANEL
ALT: 13 U/L (ref 0–35)
Alkaline Phosphatase: 47 U/L (ref 39–117)
Bilirubin, Direct: 0.1 mg/dL (ref 0.0–0.3)
Total Bilirubin: 0.6 mg/dL (ref 0.3–1.2)

## 2012-06-02 ENCOUNTER — Encounter: Payer: Self-pay | Admitting: Internal Medicine

## 2012-06-02 ENCOUNTER — Ambulatory Visit (INDEPENDENT_AMBULATORY_CARE_PROVIDER_SITE_OTHER): Payer: Commercial Managed Care - PPO | Admitting: Internal Medicine

## 2012-06-02 VITALS — BP 120/82 | HR 80 | Temp 98.5°F | Resp 16 | Ht 68.0 in | Wt 169.0 lb

## 2012-06-02 DIAGNOSIS — D649 Anemia, unspecified: Secondary | ICD-10-CM

## 2012-06-02 DIAGNOSIS — Z0000 Encounter for general adult medical examination without abnormal findings: Secondary | ICD-10-CM

## 2012-06-02 DIAGNOSIS — L570 Actinic keratosis: Secondary | ICD-10-CM

## 2012-06-02 DIAGNOSIS — J069 Acute upper respiratory infection, unspecified: Secondary | ICD-10-CM | POA: Insufficient documentation

## 2012-06-02 MED ORDER — AZITHROMYCIN 250 MG PO TABS: ORAL_TABLET | ORAL | Status: DC

## 2012-06-02 MED ORDER — ATENOLOL 50 MG PO TABS: 50.0000 mg | ORAL_TABLET | Freq: Every day | ORAL | Status: DC

## 2012-06-02 MED ORDER — PROMETHAZINE-CODEINE 6.25-10 MG/5ML PO SYRP: 5.0000 mL | ORAL_SOLUTION | ORAL | Status: DC | PRN

## 2012-06-02 NOTE — Progress Notes (Signed)
Subjective:    HPI  The patient is here for a wellness exam. The patient has been doing well overall without major physical or psychological issues going on lately  Wt Readings from Last 3 Encounters:  06/02/12 169 lb (76.658 kg)  03/30/11 160 lb (72.576 kg)  07/13/10 177 lb (80.287 kg)   BP Readings from Last 3 Encounters:  06/02/12 120/82  03/30/11 120/70  07/13/10 126/76       .Review of Systems  Constitutional: Negative for fever, chills, diaphoresis, activity change, appetite change, fatigue and unexpected weight change.  HENT: Negative for hearing loss, ear pain, congestion, sore throat, sneezing, mouth sores, neck pain, dental problem, voice change, postnasal drip and sinus pressure.   Eyes: Negative for pain and visual disturbance.  Respiratory: Negative for cough, chest tightness, wheezing and stridor.   Cardiovascular: Negative for chest pain, palpitations and leg swelling.  Gastrointestinal: Negative for nausea, vomiting, abdominal pain, blood in stool, abdominal distention and rectal pain.  Genitourinary: Negative for dysuria, hematuria, decreased urine volume, vaginal bleeding, vaginal discharge, difficulty urinating, vaginal pain and menstrual problem.  Musculoskeletal: Negative for back pain, joint swelling and gait problem.  Skin: Negative for color change, rash and wound.  Neurological: Negative for dizziness, tremors, syncope, speech difficulty and light-headedness.  Hematological: Negative for adenopathy.  Psychiatric/Behavioral: Negative for suicidal ideas, hallucinations, behavioral problems, confusion, sleep disturbance, dysphoric mood and decreased concentration. The patient is not hyperactive.        Objective:   Physical Exam  Constitutional: She appears well-developed and well-nourished. No distress.  HENT:  Head: Normocephalic.  Right Ear: External ear normal.  Left Ear: External ear normal.  Nose: Nose normal.  Mouth/Throat: Oropharynx is  clear and moist.  Eyes: Conjunctivae normal are normal. Pupils are equal, round, and reactive to light. Right eye exhibits no discharge. Left eye exhibits no discharge.  Neck: Normal range of motion. Neck supple. No JVD present. No tracheal deviation present. No thyromegaly present.  Cardiovascular: Normal rate, regular rhythm and normal heart sounds.   Pulmonary/Chest: No stridor. No respiratory distress. She has no wheezes.  Abdominal: Soft. Bowel sounds are normal. She exhibits no distension and no mass. There is no tenderness. There is no rebound and no guarding.  Musculoskeletal: She exhibits no edema and no tenderness.  Lymphadenopathy:    She has no cervical adenopathy.  Neurological: She displays normal reflexes. No cranial nerve deficit. She exhibits normal muscle tone. Coordination normal.  Skin: No rash noted. No erythema.  Psychiatric: She has a normal mood and affect. Her behavior is normal. Judgment and thought content normal.  AK R hand   Lab Results  Component Value Date   WBC 8.7 05/27/2012   HGB 12.1 05/27/2012   HCT 36.5 05/27/2012   PLT 344.0 05/27/2012   GLUCOSE 105* 05/27/2012   CHOL 192 05/27/2012   TRIG 64.0 05/27/2012   HDL 58.00 05/27/2012   LDLDIRECT 156.2 03/15/2010   LDLCALC 121* 05/27/2012   ALT 13 05/27/2012   AST 12 05/27/2012   NA 139 05/27/2012   K 3.5 05/27/2012   CL 102 05/27/2012   CREATININE 0.5 05/27/2012   BUN 18 05/27/2012   CO2 29 05/27/2012   TSH 0.39 05/27/2012     Procedure Note :     Procedure : Cryosurgery   Indication:  Actinic keratosis(es)   Risks including unsuccessful procedure , bleeding, infection, bruising, scar, a need for a repeat  procedure and others were explained to the patient in  detail as well as the benefits. Informed consent was obtained verbally.   1  lesion(s)  on R hand   was/were treated with liquid nitrogen on a Q-tip in a usual fasion . Band-Aid was applied and antibiotic ointment was given for a later use.    Tolerated well. Complications none.   Postprocedure instructions :     Keep the wounds clean. You can wash them with liquid soap and water. Pat dry with gauze or a Kleenex tissue  Before applying antibiotic ointment and a Band-Aid.   You need to report immediately  if  any signs of infection develop.       Assessment & Plan:

## 2012-06-02 NOTE — Patient Instructions (Signed)
   Postprocedure instructions :     Keep the wounds clean. You can wash them with liquid soap and water. Pat dry with gauze or a Kleenex tissue  Before applying antibiotic ointment and a Band-Aid.   You need to report immediately  if  any signs of infection develop.    

## 2012-06-02 NOTE — Assessment & Plan Note (Signed)
Resolved

## 2012-06-02 NOTE — Assessment & Plan Note (Signed)
Prom cod Z pac 

## 2012-06-02 NOTE — Assessment & Plan Note (Addendum)
We discussed age appropriate health related issues, including available/recomended screening tests and vaccinations. We discussed a need for adhering to healthy diet and exercise. Labs/EKG were reviewed/ordered. All questions were answered. GYN/Opth to sch Zostavax info

## 2012-06-02 NOTE — Assessment & Plan Note (Signed)
See procedure 

## 2012-06-03 ENCOUNTER — Encounter: Payer: Self-pay | Admitting: Internal Medicine

## 2012-06-13 ENCOUNTER — Other Ambulatory Visit: Payer: Self-pay | Admitting: Internal Medicine

## 2012-07-07 ENCOUNTER — Other Ambulatory Visit: Payer: Self-pay | Admitting: Obstetrics and Gynecology

## 2012-07-07 DIAGNOSIS — Z1231 Encounter for screening mammogram for malignant neoplasm of breast: Secondary | ICD-10-CM

## 2012-08-22 ENCOUNTER — Ambulatory Visit
Admission: RE | Admit: 2012-08-22 | Discharge: 2012-08-22 | Disposition: A | Discharge status: Home / Self Care | Payer: Commercial Managed Care - PPO | Source: Ambulatory Visit | Attending: Obstetrics and Gynecology | Admitting: Obstetrics and Gynecology

## 2012-08-22 DIAGNOSIS — Z1231 Encounter for screening mammogram for malignant neoplasm of breast: Secondary | ICD-10-CM

## 2013-06-04 ENCOUNTER — Other Ambulatory Visit: Payer: Self-pay | Admitting: Internal Medicine

## 2013-06-05 ENCOUNTER — Encounter: Payer: Self-pay | Admitting: Internal Medicine

## 2013-06-05 ENCOUNTER — Ambulatory Visit (INDEPENDENT_AMBULATORY_CARE_PROVIDER_SITE_OTHER): Payer: BC Managed Care – PPO | Admitting: Internal Medicine

## 2013-06-05 ENCOUNTER — Other Ambulatory Visit (INDEPENDENT_AMBULATORY_CARE_PROVIDER_SITE_OTHER): Payer: BC Managed Care – PPO

## 2013-06-05 VITALS — BP 130/72 | HR 68 | Temp 98.5°F | Resp 16 | Ht 68.0 in | Wt 168.0 lb

## 2013-06-05 DIAGNOSIS — Z23 Encounter for immunization: Secondary | ICD-10-CM

## 2013-06-05 DIAGNOSIS — Z0000 Encounter for general adult medical examination without abnormal findings: Secondary | ICD-10-CM

## 2013-06-05 LAB — LIPID PANEL
Cholesterol: 193 mg/dL (ref 0–200)
Total CHOL/HDL Ratio: 3
Triglycerides: 48 mg/dL (ref 0.0–149.0)
VLDL: 9.6 mg/dL (ref 0.0–40.0)

## 2013-06-05 LAB — CBC WITH DIFFERENTIAL/PLATELET
Basophils Absolute: 0.1 10*3/uL (ref 0.0–0.1)
HCT: 35 % — ABNORMAL LOW (ref 36.0–46.0)
Hemoglobin: 11.8 g/dL — ABNORMAL LOW (ref 12.0–15.0)
Lymphocytes Relative: 28.3 % (ref 12.0–46.0)
Lymphs Abs: 2.2 10*3/uL (ref 0.7–4.0)
MCHC: 33.6 g/dL (ref 30.0–36.0)
MCV: 89.8 fl (ref 78.0–100.0)
Monocytes Absolute: 0.5 10*3/uL (ref 0.1–1.0)
Neutro Abs: 4.9 10*3/uL (ref 1.4–7.7)
Platelets: 324 10*3/uL (ref 150.0–400.0)
RDW: 13.5 % (ref 11.5–14.6)

## 2013-06-05 LAB — HEPATIC FUNCTION PANEL
ALT: 13 U/L (ref 0–35)
Albumin: 4.3 g/dL (ref 3.5–5.2)
Alkaline Phosphatase: 48 U/L (ref 39–117)
Bilirubin, Direct: 0 mg/dL (ref 0.0–0.3)
Total Bilirubin: 0.8 mg/dL (ref 0.3–1.2)
Total Protein: 7 g/dL (ref 6.0–8.3)

## 2013-06-05 LAB — URINALYSIS
Ketones, ur: NEGATIVE
Leukocytes, UA: NEGATIVE
Nitrite: NEGATIVE
Specific Gravity, Urine: >=1.03 (ref 1.000–1.030)
Total Protein, Urine: NEGATIVE
pH: 6 (ref 5.0–8.0)

## 2013-06-05 LAB — BASIC METABOLIC PANEL
BUN: 18 mg/dL (ref 6–23)
CO2: 30 mEq/L (ref 19–32)
Chloride: 106 mEq/L (ref 96–112)
Glucose, Bld: 79 mg/dL (ref 70–99)
Potassium: 3.5 mEq/L (ref 3.5–5.1)

## 2013-06-05 LAB — TSH: TSH: 0.39 u[IU]/mL (ref 0.35–5.50)

## 2013-06-05 MED ORDER — ATENOLOL 50 MG PO TABS: 50.0000 mg | ORAL_TABLET | Freq: Every day | ORAL | Status: DC

## 2013-06-05 NOTE — Progress Notes (Signed)
   Subjective:    HPI  The patient is here for a wellness exam. The patient has been doing well overall without major physical or psychological issues going on lately  Wt Readings from Last 3 Encounters:  06/05/13 168 lb (76.204 kg)  06/02/12 169 lb (76.658 kg)  03/30/11 160 lb (72.576 kg)   BP Readings from Last 3 Encounters:  06/05/13 130/72  06/02/12 120/82  03/30/11 120/70       .Review of Systems  Constitutional: Negative for fever, chills, diaphoresis, activity change, appetite change, fatigue and unexpected weight change.  HENT: Negative for congestion, dental problem, ear pain, hearing loss, mouth sores, postnasal drip, sinus pressure, sneezing, sore throat and voice change.   Eyes: Negative for pain and visual disturbance.  Respiratory: Negative for cough, chest tightness, wheezing and stridor.   Cardiovascular: Negative for chest pain, palpitations and leg swelling.  Gastrointestinal: Negative for nausea, vomiting, abdominal pain, blood in stool, abdominal distention and rectal pain.  Genitourinary: Negative for dysuria, hematuria, decreased urine volume, vaginal bleeding, vaginal discharge, difficulty urinating, vaginal pain and menstrual problem.  Musculoskeletal: Negative for back pain, gait problem, joint swelling and neck pain.  Skin: Negative for color change, rash and wound.  Neurological: Negative for dizziness, tremors, syncope, speech difficulty and light-headedness.  Hematological: Negative for adenopathy.  Psychiatric/Behavioral: Negative for suicidal ideas, hallucinations, behavioral problems, confusion, sleep disturbance, dysphoric mood and decreased concentration. The patient is not hyperactive.        Objective:   Physical Exam  Constitutional: She appears well-developed and well-nourished. No distress.  HENT:  Head: Normocephalic.  Right Ear: External ear normal.  Left Ear: External ear normal.  Nose: Nose normal.  Mouth/Throat: Oropharynx is  clear and moist.  Eyes: Conjunctivae are normal. Pupils are equal, round, and reactive to light. Right eye exhibits no discharge. Left eye exhibits no discharge.  Neck: Normal range of motion. Neck supple. No JVD present. No tracheal deviation present. No thyromegaly present.  Cardiovascular: Normal rate, regular rhythm and normal heart sounds.   Pulmonary/Chest: No stridor. No respiratory distress. She has no wheezes.  Abdominal: Soft. Bowel sounds are normal. She exhibits no distension and no mass. There is no tenderness. There is no rebound and no guarding.  Musculoskeletal: She exhibits no edema and no tenderness.  Lymphadenopathy:    She has no cervical adenopathy.  Neurological: She displays normal reflexes. No cranial nerve deficit. She exhibits normal muscle tone. Coordination normal.  Skin: No rash noted. No erythema.  Psychiatric: She has a normal mood and affect. Her behavior is normal. Judgment and thought content normal.     Lab Results  Component Value Date   WBC 8.7 05/27/2012   HGB 12.1 05/27/2012   HCT 36.5 05/27/2012   PLT 344.0 05/27/2012   GLUCOSE 105* 05/27/2012   CHOL 192 05/27/2012   TRIG 64.0 05/27/2012   HDL 58.00 05/27/2012   LDLDIRECT 156.2 03/15/2010   LDLCALC 121* 05/27/2012   ALT 13 05/27/2012   AST 12 05/27/2012   NA 139 05/27/2012   K 3.5 05/27/2012   CL 102 05/27/2012   CREATININE 0.5 05/27/2012   BUN 18 05/27/2012   CO2 29 05/27/2012   TSH 0.39 05/27/2012      Assessment & Plan:

## 2013-06-05 NOTE — Assessment & Plan Note (Signed)
We discussed age appropriate health related issues, including available/recomended screening tests and vaccinations. We discussed a need for adhering to healthy diet and exercise. Labs/EKG were reviewed/ordered. All questions were answered.   

## 2013-06-05 NOTE — Progress Notes (Signed)
Pre visit review using our clinic review tool, if applicable. No additional management support is needed unless otherwise documented below in the visit note. 

## 2014-03-10 ENCOUNTER — Other Ambulatory Visit: Payer: Self-pay

## 2014-03-10 MED ORDER — ATENOLOL 50 MG PO TABS: 50.0000 mg | ORAL_TABLET | Freq: Every day | ORAL | Status: DC

## 2014-03-22 ENCOUNTER — Other Ambulatory Visit: Payer: Self-pay

## 2014-03-22 DIAGNOSIS — Z1231 Encounter for screening mammogram for malignant neoplasm of breast: Secondary | ICD-10-CM

## 2014-04-09 ENCOUNTER — Encounter (INDEPENDENT_AMBULATORY_CARE_PROVIDER_SITE_OTHER): Payer: Self-pay

## 2014-04-09 ENCOUNTER — Ambulatory Visit
Admission: RE | Admit: 2014-04-09 | Discharge: 2014-04-09 | Disposition: A | Discharge status: Home / Self Care | Payer: Managed Care, Other (non HMO) | Source: Ambulatory Visit

## 2014-04-09 DIAGNOSIS — Z1231 Encounter for screening mammogram for malignant neoplasm of breast: Secondary | ICD-10-CM

## 2014-05-12 ENCOUNTER — Ambulatory Visit (INDEPENDENT_AMBULATORY_CARE_PROVIDER_SITE_OTHER): Payer: Managed Care, Other (non HMO) | Admitting: Internal Medicine

## 2014-05-12 ENCOUNTER — Encounter: Payer: Self-pay | Admitting: Internal Medicine

## 2014-05-12 VITALS — BP 110/76 | HR 63 | Temp 98.5°F | Ht 68.0 in | Wt 161.0 lb

## 2014-05-12 DIAGNOSIS — Z0000 Encounter for general adult medical examination without abnormal findings: Secondary | ICD-10-CM

## 2014-05-12 DIAGNOSIS — E559 Vitamin D deficiency, unspecified: Secondary | ICD-10-CM

## 2014-05-12 DIAGNOSIS — R739 Hyperglycemia, unspecified: Secondary | ICD-10-CM

## 2014-05-12 MED ORDER — ATENOLOL 50 MG PO TABS: 50.0000 mg | ORAL_TABLET | Freq: Every day | ORAL | Status: DC

## 2014-05-12 MED ORDER — ERGOCALCIFEROL 1.25 MG (50000 UT) PO CAPS: 50000.0000 [IU] | ORAL_CAPSULE | ORAL | Status: DC

## 2014-05-12 NOTE — Assessment & Plan Note (Signed)
We discussed age appropriate health related issues, including available/recomended screening tests and vaccinations. We discussed a need for adhering to healthy diet and exercise. Labs/EKG were reviewed/ordered. All questions were answered.   

## 2014-05-12 NOTE — Assessment & Plan Note (Signed)
Labs in 3 mo

## 2014-05-12 NOTE — Assessment & Plan Note (Signed)
Start Vit D

## 2014-05-12 NOTE — Progress Notes (Signed)
Pre visit review using our clinic review tool, if applicable. No additional management support is needed unless otherwise documented below in the visit note. 

## 2014-05-12 NOTE — Progress Notes (Signed)
   Subjective:    HPI  The patient is here for a wellness exam. The patient has been doing well overall without major physical or psychological issues going on lately. She was Vit D deficient per recent labs  Wt Readings from Last 3 Encounters:  05/12/14 161 lb (73.029 kg)  06/05/13 168 lb (76.204 kg)  06/02/12 169 lb (76.658 kg)   BP Readings from Last 3 Encounters:  05/12/14 110/76  06/05/13 130/72  06/02/12 120/82       .Review of Systems  Constitutional: Negative for fever, chills, diaphoresis, activity change, appetite change, fatigue and unexpected weight change.  HENT: Negative for congestion, dental problem, ear pain, hearing loss, mouth sores, postnasal drip, sinus pressure, sneezing, sore throat and voice change.   Eyes: Negative for pain and visual disturbance.  Respiratory: Negative for cough, chest tightness, wheezing and stridor.   Cardiovascular: Negative for chest pain, palpitations and leg swelling.  Gastrointestinal: Negative for nausea, vomiting, abdominal pain, blood in stool, abdominal distention and rectal pain.  Genitourinary: Negative for dysuria, hematuria, decreased urine volume, vaginal bleeding, vaginal discharge, difficulty urinating, vaginal pain and menstrual problem.  Musculoskeletal: Negative for back pain, joint swelling, gait problem and neck pain.  Skin: Negative for color change, rash and wound.  Neurological: Negative for dizziness, tremors, syncope, speech difficulty and light-headedness.  Hematological: Negative for adenopathy.  Psychiatric/Behavioral: Negative for suicidal ideas, hallucinations, behavioral problems, confusion, sleep disturbance, dysphoric mood and decreased concentration. The patient is not hyperactive.        Objective:   Physical Exam  Constitutional: She appears well-developed. No distress.  HENT:  Head: Normocephalic.  Right Ear: External ear normal.  Left Ear: External ear normal.  Nose: Nose normal.   Mouth/Throat: Oropharynx is clear and moist.  Eyes: Conjunctivae are normal. Pupils are equal, round, and reactive to light. Right eye exhibits no discharge. Left eye exhibits no discharge.  Neck: Normal range of motion. Neck supple. No JVD present. No tracheal deviation present. No thyromegaly present.  Cardiovascular: Normal rate, regular rhythm and normal heart sounds.   Pulmonary/Chest: No stridor. No respiratory distress. She has no wheezes.  Abdominal: Soft. Bowel sounds are normal. She exhibits no distension and no mass. There is no tenderness. There is no rebound and no guarding.  Musculoskeletal: She exhibits no edema or tenderness.  Lymphadenopathy:    She has no cervical adenopathy.  Neurological: She displays normal reflexes. No cranial nerve deficit. She exhibits normal muscle tone. Coordination normal.  Skin: No rash noted. No erythema.  Psychiatric: She has a normal mood and affect. Her behavior is normal. Judgment and thought content normal.     Lab Results  Component Value Date   WBC 7.8 06/05/2013   HGB 11.8* 06/05/2013   HCT 35.0* 06/05/2013   PLT 324.0 06/05/2013   GLUCOSE 79 06/05/2013   CHOL 193 06/05/2013   TRIG 48.0 06/05/2013   HDL 65.30 06/05/2013   LDLDIRECT 156.2 03/15/2010   LDLCALC 118* 06/05/2013   ALT 13 06/05/2013   AST 13 06/05/2013   NA 140 06/05/2013   K 3.5 06/05/2013   CL 106 06/05/2013   CREATININE 0.5 06/05/2013   BUN 18 06/05/2013   CO2 30 06/05/2013   TSH 0.39 06/05/2013   Vit D 19 A1c 5.9 Other labs 04/29/14   Assessment & Plan:

## 2015-06-16 ENCOUNTER — Encounter: Payer: Self-pay | Admitting: Cardiovascular Disease

## 2015-06-22 ENCOUNTER — Other Ambulatory Visit (INDEPENDENT_AMBULATORY_CARE_PROVIDER_SITE_OTHER): Payer: Managed Care, Other (non HMO)

## 2015-06-22 ENCOUNTER — Ambulatory Visit (INDEPENDENT_AMBULATORY_CARE_PROVIDER_SITE_OTHER): Payer: Managed Care, Other (non HMO) | Admitting: Internal Medicine

## 2015-06-22 ENCOUNTER — Encounter: Payer: Self-pay | Admitting: Internal Medicine

## 2015-06-22 VITALS — BP 120/82 | HR 94 | Ht 68.0 in | Wt 166.0 lb

## 2015-06-22 DIAGNOSIS — M546 Pain in thoracic spine: Secondary | ICD-10-CM | POA: Insufficient documentation

## 2015-06-22 DIAGNOSIS — Z0000 Encounter for general adult medical examination without abnormal findings: Secondary | ICD-10-CM

## 2015-06-22 DIAGNOSIS — M25511 Pain in right shoulder: Secondary | ICD-10-CM | POA: Diagnosis not present

## 2015-06-22 DIAGNOSIS — M25512 Pain in left shoulder: Secondary | ICD-10-CM

## 2015-06-22 DIAGNOSIS — M545 Low back pain, unspecified: Secondary | ICD-10-CM

## 2015-06-22 LAB — URINALYSIS, ROUTINE W REFLEX MICROSCOPIC
BILIRUBIN URINE: NEGATIVE
Ketones, ur: NEGATIVE
NITRITE: NEGATIVE
PH: 6 (ref 5.0–8.0)
SPECIFIC GRAVITY, URINE: 1.025 (ref 1.000–1.030)
TOTAL PROTEIN, URINE-UPE24: NEGATIVE
UROBILINOGEN UA: 0.2 (ref 0.0–1.0)
Urine Glucose: NEGATIVE

## 2015-06-22 LAB — BASIC METABOLIC PANEL
BUN: 19 mg/dL (ref 6–23)
CALCIUM: 9.8 mg/dL (ref 8.4–10.5)
CHLORIDE: 104 meq/L (ref 96–112)
CO2: 28 meq/L (ref 19–32)
CREATININE: 0.52 mg/dL (ref 0.40–1.20)
GFR: 129.67 mL/min (ref >60.00)
GLUCOSE: 109 mg/dL — AB (ref 70–99)
Potassium: 3.8 mEq/L (ref 3.5–5.1)
Sodium: 139 mEq/L (ref 135–145)

## 2015-06-22 LAB — CBC WITH DIFFERENTIAL/PLATELET
BASOS ABS: 0.1 10*3/uL (ref 0.0–0.1)
Basophils Relative: 0.8 % (ref 0.0–3.0)
EOS PCT: 0.5 % (ref 0.0–5.0)
Eosinophils Absolute: 0 10*3/uL (ref 0.0–0.7)
HEMATOCRIT: 40 % (ref 36.0–46.0)
HEMOGLOBIN: 13.1 g/dL (ref 12.0–15.0)
LYMPHS ABS: 2 10*3/uL (ref 0.7–4.0)
LYMPHS PCT: 24 % (ref 12.0–46.0)
MCHC: 32.7 g/dL (ref 30.0–36.0)
MCV: 91.1 fl (ref 78.0–100.0)
MONOS PCT: 5.8 % (ref 3.0–12.0)
Monocytes Absolute: 0.5 10*3/uL (ref 0.1–1.0)
Neutro Abs: 5.9 10*3/uL (ref 1.4–7.7)
Neutrophils Relative %: 68.9 % (ref 43.0–77.0)
Platelets: 409 10*3/uL — ABNORMAL HIGH (ref 150.0–400.0)
RBC: 4.4 Mil/uL (ref 3.87–5.11)
RDW: 13.7 % (ref 11.5–15.5)
WBC: 8.4 10*3/uL (ref 4.0–10.5)

## 2015-06-22 LAB — HEPATITIS C ANTIBODY: HCV Ab: NEGATIVE

## 2015-06-22 LAB — TSH: TSH: 0.26 u[IU]/mL — ABNORMAL LOW (ref 0.35–4.50)

## 2015-06-22 LAB — LIPID PANEL
CHOL/HDL RATIO: 3
CHOLESTEROL: 203 mg/dL — AB (ref 0–200)
HDL: 64.8 mg/dL (ref >39.00)
LDL CALC: 116 mg/dL — AB (ref 0–99)
NonHDL: 137.81
TRIGLYCERIDES: 111 mg/dL (ref 0.0–149.0)
VLDL: 22.2 mg/dL (ref 0.0–40.0)

## 2015-06-22 LAB — HEPATIC FUNCTION PANEL
ALT: 12 U/L (ref 0–35)
AST: 13 U/L (ref 0–37)
Albumin: 4.5 g/dL (ref 3.5–5.2)
Alkaline Phosphatase: 61 U/L (ref 39–117)
BILIRUBIN DIRECT: 0.1 mg/dL (ref 0.0–0.3)
BILIRUBIN TOTAL: 0.5 mg/dL (ref 0.2–1.2)
TOTAL PROTEIN: 7.5 g/dL (ref 6.0–8.3)

## 2015-06-22 LAB — IBC PANEL
Iron: 100 ug/dL (ref 42–145)
Saturation Ratios: 28.7 % (ref 20.0–50.0)
TRANSFERRIN: 249 mg/dL (ref 212.0–360.0)

## 2015-06-22 MED ORDER — MELOXICAM 15 MG PO TABS: 15.0000 mg | ORAL_TABLET | Freq: Every day | ORAL | Status: DC | PRN

## 2015-06-22 MED ORDER — ATENOLOL 50 MG PO TABS: 50.0000 mg | ORAL_TABLET | Freq: Every day | ORAL | Status: DC

## 2015-06-22 MED ORDER — CYCLOBENZAPRINE HCL 5 MG PO TABS: 5.0000 mg | ORAL_TABLET | Freq: Two times a day (BID) | ORAL | Status: DC | PRN

## 2015-06-22 MED ORDER — HYDROCODONE-ACETAMINOPHEN 5-325 MG PO TABS: 1.0000 | ORAL_TABLET | Freq: Four times a day (QID) | ORAL | Status: DC | PRN

## 2015-06-22 NOTE — Progress Notes (Signed)
Pre visit review using our clinic review tool, if applicable. No additional management support is needed unless otherwise documented below in the visit note. 

## 2015-06-22 NOTE — Progress Notes (Signed)
Subjective:  Patient ID: Sheila Lin, female    DOB: 18-Jan-1961  Age: 54 y.o. MRN: [id number]  CC: Annual Exam   HPI Sheila Lin presents for a well exam  C/o LBP, thor pain, shoulders - taking care of her dtr w/Fridricksen's muscle dystrophy  Outpatient Prescriptions Prior to Visit  Medication Sig Dispense Refill  . atenolol (TENORMIN) 50 MG tablet Take 1 tablet (50 mg total) by mouth daily. 90 tablet 3  . Cholecalciferol 1000 UNITS tablet Take 1,000 Units by mouth daily.      . ergocalciferol (VITAMIN D2) 50000 UNITS capsule Take 1 capsule (50,000 Units total) by mouth once a week. 8 capsule 0  . vitamin C (ASCORBIC ACID) 500 MG tablet Take 500 mg by mouth daily.       No facility-administered medications prior to visit.    ROS Review of Systems  Constitutional: Negative for chills, activity change, appetite change, fatigue and unexpected weight change.  HENT: Negative for congestion, mouth sores and sinus pressure.   Eyes: Negative for visual disturbance.  Respiratory: Negative for cough and chest tightness.   Gastrointestinal: Negative for nausea and abdominal pain.  Genitourinary: Negative for frequency, difficulty urinating and vaginal pain.  Musculoskeletal: Positive for back pain, arthralgias, neck pain and neck stiffness. Negative for gait problem.  Skin: Negative for pallor and rash.  Neurological: Negative for dizziness, tremors, weakness, numbness and headaches.  Psychiatric/Behavioral: Negative for confusion and sleep disturbance.    Objective:  BP 120/82 mmHg  Pulse 94  Ht 5\' 8"  (1.727 m)  Wt 166 lb (75.297 kg)  BMI 25.25 kg/m2  SpO2 97%  BP Readings from Last 3 Encounters:  06/22/15 120/82  05/12/14 110/76  06/05/13 130/72    Wt Readings from Last 3 Encounters:  06/22/15 166 lb (75.297 kg)  05/12/14 161 lb (73.029 kg)  06/05/13 168 lb (76.204 kg)    Physical Exam  Constitutional: She appears well-developed. No distress.  HENT:    Head: Normocephalic.  Right Ear: External ear normal.  Left Ear: External ear normal.  Nose: Nose normal.  Mouth/Throat: Oropharynx is clear and moist.  Eyes: Conjunctivae are normal. Pupils are equal, round, and reactive to light. Right eye exhibits no discharge. Left eye exhibits no discharge.  Neck: Normal range of motion. Neck supple. No JVD present. No tracheal deviation present. No thyromegaly present.  Cardiovascular: Normal rate, regular rhythm and normal heart sounds.   Pulmonary/Chest: No stridor. No respiratory distress. She has no wheezes.  Abdominal: Soft. Bowel sounds are normal. She exhibits no distension and no mass. There is no tenderness. There is no rebound and no guarding.  Musculoskeletal: She exhibits tenderness. She exhibits no edema.  Lymphadenopathy:    She has no cervical adenopathy.  Neurological: She displays normal reflexes. No cranial nerve deficit. She exhibits normal muscle tone. Coordination normal.  Skin: No rash noted. No erythema.  Psychiatric: She has a normal mood and affect. Her behavior is normal. Judgment and thought content normal.  LS, thor back is tender  Lab Results  Component Value Date   WBC 7.8 06/05/2013   HGB 11.8* 06/05/2013   HCT 35.0* 06/05/2013   PLT 324.0 06/05/2013   GLUCOSE 79 06/05/2013   CHOL 193 06/05/2013   TRIG 48.0 06/05/2013   HDL 65.30 06/05/2013   LDLDIRECT 156.2 03/15/2010   LDLCALC 118* 06/05/2013   ALT 13 06/05/2013   AST 13 06/05/2013   NA 140 06/05/2013   K 3.5 06/05/2013  CL 106 06/05/2013   CREATININE 0.5 06/05/2013   BUN 18 06/05/2013   CO2 30 06/05/2013   TSH 0.39 06/05/2013    Mm Digital Screening Bilateral  04/09/2014  CLINICAL DATA:  Screening. EXAM: DIGITAL SCREENING BILATERAL MAMMOGRAM WITH CAD COMPARISON:  Previous exam(s). ACR Breast Density Category b: There are scattered areas of fibroglandular density. FINDINGS: There are no findings suspicious for malignancy. Images were processed with  CAD. IMPRESSION: No mammographic evidence of malignancy. A result letter of this screening mammogram will be mailed directly to the patient. RECOMMENDATION: Screening mammogram in one year. (Code:SM-B-01Y) BI-RADS CATEGORY  1: Negative. Electronically Signed   By: Lillia Mountain M.D.   On: 04/09/2014 13:12    Assessment & Plan:   There are no diagnoses linked to this encounter. I am having Ms. Jun maintain her vitamin C, Cholecalciferol, atenolol, and ergocalciferol.  No orders of the defined types were placed in this encounter.     Follow-up: No Follow-up on file.  Walker Kehr, MD

## 2015-06-22 NOTE — Assessment & Plan Note (Addendum)
MSK strain Stretching exercises Meloxicam prn, Flexeril prn Norco prn  Potential benefits of a short term opioids use as well as potential risks (i.e. addiction risk, apnea etc) and complications (i.e. Somnolence, constipation and others) were explained to the patient and were aknowledged.

## 2015-06-22 NOTE — Assessment & Plan Note (Signed)
We discussed age appropriate health related issues, including available/recomended screening tests and vaccinations. We discussed a need for adhering to healthy diet and exercise. Labs/EKG were reviewed/ordered. All questions were answered. Colon 2011 GYN q 2 years Derm - q 12 mo Sch Ophth OV

## 2015-06-28 ENCOUNTER — Telehealth: Payer: Self-pay

## 2015-06-28 NOTE — Telephone Encounter (Signed)
Called patient to advise them of the results from lab work. If patient calls back please advise them that all lab work came back normal.

## 2015-07-29 ENCOUNTER — Other Ambulatory Visit: Payer: Self-pay

## 2015-07-29 DIAGNOSIS — Z1231 Encounter for screening mammogram for malignant neoplasm of breast: Secondary | ICD-10-CM

## 2015-08-22 ENCOUNTER — Ambulatory Visit
Admission: RE | Admit: 2015-08-22 | Discharge: 2015-08-22 | Disposition: A | Discharge status: Home / Self Care | Payer: 59 | Source: Ambulatory Visit

## 2015-08-22 DIAGNOSIS — Z1231 Encounter for screening mammogram for malignant neoplasm of breast: Secondary | ICD-10-CM

## 2016-02-20 ENCOUNTER — Ambulatory Visit: Payer: 59 | Admitting: Internal Medicine

## 2016-07-09 ENCOUNTER — Encounter: Payer: Self-pay | Admitting: Internal Medicine

## 2016-07-09 ENCOUNTER — Other Ambulatory Visit (INDEPENDENT_AMBULATORY_CARE_PROVIDER_SITE_OTHER): Payer: 59

## 2016-07-09 ENCOUNTER — Ambulatory Visit (INDEPENDENT_AMBULATORY_CARE_PROVIDER_SITE_OTHER): Payer: 59 | Admitting: Internal Medicine

## 2016-07-09 VITALS — BP 110/84 | HR 78 | Ht 68.0 in | Wt 169.0 lb

## 2016-07-09 DIAGNOSIS — Z0000 Encounter for general adult medical examination without abnormal findings: Secondary | ICD-10-CM

## 2016-07-09 DIAGNOSIS — B001 Herpesviral vesicular dermatitis: Secondary | ICD-10-CM

## 2016-07-09 LAB — LIPID PANEL
Cholesterol: 212 mg/dL — ABNORMAL HIGH (ref 0–200)
HDL: 67 mg/dL (ref >39.00)
LDL Cholesterol: 134 mg/dL — ABNORMAL HIGH (ref 0–99)
NONHDL: 144.88
Total CHOL/HDL Ratio: 3
Triglycerides: 53 mg/dL (ref 0.0–149.0)
VLDL: 10.6 mg/dL (ref 0.0–40.0)

## 2016-07-09 LAB — URINALYSIS, ROUTINE W REFLEX MICROSCOPIC
KETONES UR: NEGATIVE
LEUKOCYTES UA: NEGATIVE
NITRITE: NEGATIVE
RBC / HPF: NONE SEEN (ref 0–?)
Specific Gravity, Urine: >=1.03 — AB (ref 1.000–1.030)
Total Protein, Urine: NEGATIVE
Urine Glucose: NEGATIVE
Urobilinogen, UA: 0.2 (ref 0.0–1.0)
WBC, UA: NONE SEEN (ref 0–?)
pH: 5.5 (ref 5.0–8.0)

## 2016-07-09 LAB — HEPATIC FUNCTION PANEL
ALK PHOS: 65 U/L (ref 39–117)
ALT: 18 U/L (ref 0–35)
AST: 17 U/L (ref 0–37)
Albumin: 4.5 g/dL (ref 3.5–5.2)
Bilirubin, Direct: 0.1 mg/dL (ref 0.0–0.3)
TOTAL PROTEIN: 7.3 g/dL (ref 6.0–8.3)
Total Bilirubin: 0.5 mg/dL (ref 0.2–1.2)

## 2016-07-09 LAB — CBC WITH DIFFERENTIAL/PLATELET
BASOS ABS: 0.1 10*3/uL (ref 0.0–0.1)
Basophils Relative: 0.8 % (ref 0.0–3.0)
Eosinophils Absolute: 0 10*3/uL (ref 0.0–0.7)
Eosinophils Relative: 0.6 % (ref 0.0–5.0)
HCT: 36.5 % (ref 36.0–46.0)
Hemoglobin: 12.5 g/dL (ref 12.0–15.0)
LYMPHS ABS: 1.8 10*3/uL (ref 0.7–4.0)
Lymphocytes Relative: 24.3 % (ref 12.0–46.0)
MCHC: 34.4 g/dL (ref 30.0–36.0)
MCV: 87.5 fl (ref 78.0–100.0)
MONO ABS: 0.4 10*3/uL (ref 0.1–1.0)
MONOS PCT: 4.9 % (ref 3.0–12.0)
NEUTROS ABS: 5.3 10*3/uL (ref 1.4–7.7)
Neutrophils Relative %: 69.4 % (ref 43.0–77.0)
PLATELETS: 390 10*3/uL (ref 150.0–400.0)
RBC: 4.17 Mil/uL (ref 3.87–5.11)
RDW: 13.2 % (ref 11.5–15.5)
WBC: 7.6 10*3/uL (ref 4.0–10.5)

## 2016-07-09 LAB — BASIC METABOLIC PANEL
BUN: 16 mg/dL (ref 6–23)
CALCIUM: 9.8 mg/dL (ref 8.4–10.5)
CO2: 26 meq/L (ref 19–32)
CREATININE: 0.58 mg/dL (ref 0.40–1.20)
Chloride: 107 mEq/L (ref 96–112)
GFR: 113.88 mL/min (ref >60.00)
GLUCOSE: 107 mg/dL — AB (ref 70–99)
Potassium: 3.7 mEq/L (ref 3.5–5.1)
SODIUM: 140 meq/L (ref 135–145)

## 2016-07-09 LAB — TSH: TSH: 0.3 u[IU]/mL — AB (ref 0.35–4.50)

## 2016-07-09 MED ORDER — ATENOLOL 50 MG PO TABS: 50.0000 mg | ORAL_TABLET | Freq: Every day | ORAL | 3 refills | Status: DC

## 2016-07-09 MED ORDER — VALACYCLOVIR HCL 500 MG PO TABS: 500.0000 mg | ORAL_TABLET | Freq: Two times a day (BID) | ORAL | 1 refills | Status: DC

## 2016-07-09 NOTE — Progress Notes (Signed)
Subjective:  Patient ID: [name], female    DOB: 08/06/1960  Age: 56 y.o. MRN: [id number]  CC: Annual Exam   HPI Mackenze B Bilicki presents for a well exam C/o fever blister  Outpatient Medications Prior to Visit  Medication Sig Dispense Refill  . atenolol (TENORMIN) 50 MG tablet Take 1 tablet (50 mg total) by mouth daily. 90 tablet 3  . Cholecalciferol 1000 UNITS tablet Take 1,000 Units by mouth daily.      . vitamin C (ASCORBIC ACID) 500 MG tablet Take 500 mg by mouth daily.      . cyclobenzaprine (FLEXERIL) 5 MG tablet Take 1 tablet (5 mg total) by mouth 2 (two) times daily as needed for muscle spasms. (Patient not taking: Reported on 07/09/2016) 60 tablet 0  . HYDROcodone-acetaminophen (NORCO/VICODIN) 5-325 MG tablet Take 1 tablet by mouth every 6 (six) hours as needed for severe pain. (Patient not taking: Reported on 07/09/2016) 60 tablet 0  . meloxicam (MOBIC) 15 MG tablet Take 1 tablet (15 mg total) by mouth daily as needed for pain. (Patient not taking: Reported on 07/09/2016) 30 tablet 3   No facility-administered medications prior to visit.     ROS Review of Systems  Constitutional: Negative for activity change, appetite change, chills, fatigue and unexpected weight change.  HENT: Negative for congestion, mouth sores and sinus pressure.   Eyes: Negative for visual disturbance.  Respiratory: Negative for cough and chest tightness.   Gastrointestinal: Negative for abdominal pain and nausea.  Genitourinary: Negative for difficulty urinating, frequency and vaginal pain.  Musculoskeletal: Negative for back pain and gait problem.  Skin: Negative for pallor and rash.  Neurological: Negative for dizziness, tremors, weakness, numbness and headaches.  Psychiatric/Behavioral: Negative for confusion, sleep disturbance and suicidal ideas.    Objective:  BP 110/84   Pulse 78   Ht 5\' 8"  (1.727 m)   Wt 169 lb (76.7 kg)   SpO2 97%   BMI 25.70 kg/m   BP Readings from  Last 3 Encounters:  07/09/16 110/84  06/22/15 120/82  05/12/14 110/76    Wt Readings from Last 3 Encounters:  07/09/16 169 lb (76.7 kg)  06/22/15 166 lb (75.3 kg)  05/12/14 161 lb (73 kg)    Physical Exam  Constitutional: She appears well-developed. No distress.  HENT:  Head: Normocephalic.  Right Ear: External ear normal.  Left Ear: External ear normal.  Nose: Nose normal.  Mouth/Throat: Oropharynx is clear and moist.  Eyes: Conjunctivae are normal. Pupils are equal, round, and reactive to light. Right eye exhibits no discharge. Left eye exhibits no discharge.  Neck: Normal range of motion. Neck supple. No JVD present. No tracheal deviation present. No thyromegaly present.  Cardiovascular: Normal rate, regular rhythm and normal heart sounds.   Pulmonary/Chest: No stridor. No respiratory distress. She has no wheezes.  Abdominal: Soft. Bowel sounds are normal. She exhibits no distension and no mass. There is no tenderness. There is no rebound and no guarding.  Musculoskeletal: She exhibits no edema or tenderness.  Lymphadenopathy:    She has no cervical adenopathy.  Neurological: She displays normal reflexes. No cranial nerve deficit. She exhibits normal muscle tone. Coordination normal.  Skin: No rash noted. No erythema.  Psychiatric: She has a normal mood and affect. Her behavior is normal. Judgment and thought content normal.  a cold sore - dry on the lowe lip  Lab Results  Component Value Date   WBC 8.4 06/22/2015   HGB 13.1 06/22/2015  HCT 40.0 06/22/2015   PLT 409.0 (H) 06/22/2015   GLUCOSE 109 (H) 06/22/2015   CHOL 203 (H) 06/22/2015   TRIG 111.0 06/22/2015   HDL 64.80 06/22/2015   LDLDIRECT 156.2 03/15/2010   LDLCALC 116 (H) 06/22/2015   ALT 12 06/22/2015   AST 13 06/22/2015   NA 139 06/22/2015   K 3.8 06/22/2015   CL 104 06/22/2015   CREATININE 0.52 06/22/2015   BUN 19 06/22/2015   CO2 28 06/22/2015   TSH 0.26 (L) 06/22/2015    Mm Digital Screening  Bilateral  Result Date: 08/22/2015 CLINICAL DATA:  Screening. EXAM: DIGITAL SCREENING BILATERAL MAMMOGRAM WITH CAD COMPARISON:  Previous exam(s). ACR Breast Density Category c: The breast tissue is heterogeneously dense, which may obscure small masses. FINDINGS: There are no findings suspicious for malignancy. Images were processed with CAD. IMPRESSION: No mammographic evidence of malignancy. A result letter of this screening mammogram will be mailed directly to the patient. RECOMMENDATION: Screening mammogram in one year. (Code:SM-B-01Y) BI-RADS CATEGORY  1: Negative. Electronically Signed   By: Franki Cabot M.D.   On: 08/22/2015 16:36    Assessment & Plan:   There are no diagnoses linked to this encounter. I am having Ms. Burgus maintain her vitamin C, Cholecalciferol, atenolol, meloxicam, cyclobenzaprine, and HYDROcodone-acetaminophen.  No orders of the defined types were placed in this encounter.    Follow-up: No Follow-up on file.  Walker Kehr, MD

## 2016-07-09 NOTE — Patient Instructions (Signed)
Cold Sore A cold sore (fever blister) is a skin infection caused by the herpes simplex virus (HSV-1). HSV-1 is closely related to the virus that causes genital herpes (HSV-2), but they are not the same even though both viruses can cause oral and genital infections. Cold sores are small, fluid-filled sores inside of the mouth or on the lips, gums, nose, chin, cheeks, or fingers.  The herpes simplex virus can be easily passed (contagious) to other people through close personal contact, such as kissing or sharing personal items. The virus can also spread to other parts of the body, such as the eyes or genitals. Cold sores are contagious until the sores crust over completely. They often heal within 2 weeks.  Once a person is infected, the herpes simplex virus remains permanently in the body. Therefore, there is no cure for cold sores, and they often recur when a person is tired, stressed, sick, or gets too much sun. Additional factors that can cause a recurrence include hormone changes in menstruation or pregnancy, certain drugs, and cold weather.  CAUSES  Cold sores are caused by the herpes simplex virus. The virus is spread from person to person through close contact, such as through kissing, touching the affected area, or sharing personal items such as lip balm, razors, or eating utensils.  SYMPTOMS  The first infection may not cause symptoms. If symptoms develop, the symptoms often go through different stages. Here is how a cold sore develops:   Tingling, itching, or burning is felt 1-2 days before the outbreak.   Fluid-filled blisters appear on the lips, inside the mouth, nose, or on the cheeks.   The blisters start to ooze clear fluid.   The blisters dry up and a yellow crust appears in its place.   The crust falls off.  Symptoms depend on whether it is the initial outbreak or a recurrence. Some other symptoms with the first outbreak may include:   Fever.   Sore throat.   Headache.    Muscle aches.   Swollen neck glands.  DIAGNOSIS  A diagnosis is often made based on your symptoms and looking at the sores. Sometimes, a sore may be swabbed and then examined in the lab to make a final diagnosis. If the sores are not present, blood tests can find the herpes simplex virus.  TREATMENT  There is no cure for cold sores and no vaccine for the herpes simplex virus. Within 2 weeks, most cold sores go away on their own without treatment. Medicines cannot make the infection go away, but medicine can help relieve some of the pain associated with the sores, can work to stop the virus from multiplying, and can also shorten healing time. Medicine may be in the form of creams, gels, pills, or a shot.  HOME CARE INSTRUCTIONS   Only take over-the-counter or prescription medicines for pain, discomfort, or fever as directed by your caregiver. Do not use aspirin.   Use a cotton-tip swab to apply creams or gels to your sores.   Do not touch the sores or pick the scabs. Wash your hands often. Do not touch your eyes without washing your hands first.   Avoid kissing, oral sex, and sharing personal items until sores heal.   Apply an ice pack on your sores for 10-15 minutes to ease any discomfort.   Avoid hot, cold, or salty foods because they may hurt your mouth. Eat a soft, bland diet to avoid irritating the sores. Use a straw to drink   Avoid hot, cold, or salty foods because they may hurt your mouth. Eat a soft, bland diet to avoid irritating the sores. Use a straw to drink if you have pain when drinking out of a glass.    Keep sores clean and dry to prevent an infection of other tissues.    Avoid the sun and limit stress if these things trigger outbreaks. If sun causes cold sores, apply sunscreen on the lips before being out in the sun.   SEEK MEDICAL CARE IF:    You have a fever or persistent symptoms for more than 2-3 days.    You have a fever and your symptoms suddenly get worse.    You have pus, not clear fluid, coming from the sores.    You have redness that is spreading.    You have pain or irritation in your  eye.    You get sores on your genitals.    Your sores do not heal within 2 weeks.    You have a weakened immune system.    You have frequent recurrences of cold sores.   MAKE SURE YOU:    Understand these instructions.   Will watch your condition.   Will get help right away if you are not doing well or get worse.     This information is not intended to replace advice given to you by your health care provider. Make sure you discuss any questions you have with your health care provider.     Document Released: 06/08/2000 Document Revised: 07/02/2014 Document Reviewed: 04/01/2015  [employer]vier Interactive Patient Education 2017 [employer]

## 2016-07-09 NOTE — Progress Notes (Signed)
Pre visit review using our clinic review tool, if applicable. No additional management support is needed unless otherwise documented below in the visit note. 

## 2016-07-09 NOTE — Assessment & Plan Note (Signed)
Valtrex prn  

## 2016-07-09 NOTE — Assessment & Plan Note (Signed)
We discussed age appropriate health related issues, including available/recomended screening tests and vaccinations. We discussed a need for adhering to healthy diet and exercise. Labs/EKG were reviewed/ordered. All questions were answered.   

## 2016-07-13 ENCOUNTER — Telehealth: Payer: Self-pay | Admitting: Internal Medicine

## 2016-07-13 NOTE — Telephone Encounter (Signed)
Labs will be orders after the appt has taken place.

## 2016-07-13 NOTE — Telephone Encounter (Signed)
Notified patient.

## 2016-07-13 NOTE — Telephone Encounter (Signed)
Patient called back in.  Gave MD response on labs.  Scheduled patient 6 month fu.  Does patient need to come in sooner for labs or do labs that day?  Did not see labs entered.

## 2016-10-16 DIAGNOSIS — K5730 Diverticulosis of large intestine without perforation or abscess without bleeding: Secondary | ICD-10-CM | POA: Insufficient documentation

## 2016-10-16 DIAGNOSIS — K5790 Diverticulosis of intestine, part unspecified, without perforation or abscess without bleeding: Secondary | ICD-10-CM | POA: Insufficient documentation

## 2016-10-16 DIAGNOSIS — M1990 Unspecified osteoarthritis, unspecified site: Secondary | ICD-10-CM | POA: Insufficient documentation

## 2016-10-16 DIAGNOSIS — F32A Depression, unspecified: Secondary | ICD-10-CM | POA: Insufficient documentation

## 2016-10-16 DIAGNOSIS — O009 Unspecified ectopic pregnancy without intrauterine pregnancy: Secondary | ICD-10-CM | POA: Insufficient documentation

## 2016-10-22 ENCOUNTER — Other Ambulatory Visit: Payer: Self-pay | Admitting: Obstetrics and Gynecology

## 2016-10-22 DIAGNOSIS — R928 Other abnormal and inconclusive findings on diagnostic imaging of breast: Secondary | ICD-10-CM

## 2016-10-30 ENCOUNTER — Ambulatory Visit
Admission: RE | Admit: 2016-10-30 | Discharge: 2016-10-30 | Disposition: A | Discharge status: Home / Self Care | Payer: 59 | Source: Ambulatory Visit | Attending: Obstetrics and Gynecology | Admitting: Obstetrics and Gynecology

## 2016-10-30 DIAGNOSIS — R928 Other abnormal and inconclusive findings on diagnostic imaging of breast: Secondary | ICD-10-CM

## 2017-01-10 ENCOUNTER — Ambulatory Visit: Payer: 59 | Admitting: Internal Medicine

## 2017-07-12 ENCOUNTER — Ambulatory Visit (INDEPENDENT_AMBULATORY_CARE_PROVIDER_SITE_OTHER): Payer: 59 | Admitting: Internal Medicine

## 2017-07-12 ENCOUNTER — Encounter: Payer: Self-pay | Admitting: Internal Medicine

## 2017-07-12 VITALS — BP 124/82 | HR 71 | Temp 98.5°F | Ht 68.0 in | Wt 178.0 lb

## 2017-07-12 DIAGNOSIS — R21 Rash and other nonspecific skin eruption: Secondary | ICD-10-CM | POA: Insufficient documentation

## 2017-07-12 DIAGNOSIS — R0789 Other chest pain: Secondary | ICD-10-CM | POA: Diagnosis not present

## 2017-07-12 DIAGNOSIS — B028 Zoster with other complications: Secondary | ICD-10-CM

## 2017-07-12 DIAGNOSIS — B029 Zoster without complications: Secondary | ICD-10-CM | POA: Insufficient documentation

## 2017-07-12 MED ORDER — CLOTRIMAZOLE-BETAMETHASONE 1-0.05 % EX CREA: 1.0000 "application " | TOPICAL_CREAM | Freq: Two times a day (BID) | CUTANEOUS | 3 refills | Status: DC

## 2017-07-12 MED ORDER — GABAPENTIN 100 MG PO CAPS: 100.0000 mg | ORAL_CAPSULE | Freq: Three times a day (TID) | ORAL | 3 refills | Status: DC

## 2017-07-12 MED ORDER — VALACYCLOVIR HCL 1 G PO TABS: 1000.0000 mg | ORAL_TABLET | Freq: Three times a day (TID) | ORAL | 0 refills | Status: DC

## 2017-07-12 MED ORDER — OXYCODONE-ACETAMINOPHEN 7.5-325 MG PO TABS: 1.0000 | ORAL_TABLET | Freq: Four times a day (QID) | ORAL | 0 refills | Status: DC | PRN

## 2017-07-12 MED ORDER — KETOROLAC TROMETHAMINE 60 MG/2ML IM SOLN
60.0000 mg | Freq: Once | INTRAMUSCULAR | Status: AC
  Administered 2017-07-12: 60 mg via INTRAMUSCULAR

## 2017-07-12 NOTE — Assessment & Plan Note (Signed)
Valtrex po

## 2017-07-12 NOTE — Progress Notes (Signed)
Subjective:  Patient ID: Sheila Lin, female    DOB: January 27, 1961  Age: 57 y.o. MRN: [id number]  CC: No chief complaint on file.   HPI Sheila Lin presents for sever pain in the L chest pain and rash since last night. Pt had L back pain prior. Pain is severe 10/10.  Outpatient Medications Prior to Visit  Medication Sig Dispense Refill  . atenolol (TENORMIN) 50 MG tablet Take 1 tablet (50 mg total) by mouth daily. 90 tablet 3  . CALCIUM PO Take by mouth.    . Cholecalciferol 1000 UNITS tablet Take 1,000 Units by mouth daily.      . valACYclovir (VALTREX) 500 MG tablet Take 1 tablet (500 mg total) by mouth 2 (two) times daily. (Patient not taking: Reported on 07/12/2017) 14 tablet 1  . vitamin C (ASCORBIC ACID) 500 MG tablet Take 500 mg by mouth daily.       No facility-administered medications prior to visit.     ROS Review of Systems  Constitutional: Positive for chills and fatigue. Negative for activity change, appetite change and unexpected weight change.  HENT: Negative for congestion, mouth sores and sinus pressure.   Eyes: Negative for visual disturbance.  Respiratory: Negative for cough and chest tightness.   Cardiovascular: Positive for chest pain.  Gastrointestinal: Negative for abdominal pain and nausea.  Genitourinary: Negative for difficulty urinating, frequency and vaginal pain.  Musculoskeletal: Positive for back pain. Negative for gait problem.  Skin: Positive for rash. Negative for pallor.  Neurological: Negative for dizziness, tremors, weakness, numbness and headaches.  Psychiatric/Behavioral: Negative for confusion and sleep disturbance.    Objective:  BP 124/82 (BP Location: Left Arm, Patient Position: Sitting, Cuff Size: Large)   Pulse 71   Temp 98.5 F (36.9 C) (Oral)   Ht 5\' 8"  (1.727 m)   Wt 178 lb (80.7 kg)   SpO2 98%   BMI 27.06 kg/m   BP Readings from Last 3 Encounters:  07/12/17 124/82  07/09/16 110/84  06/22/15 120/82    Wt  Readings from Last 3 Encounters:  07/12/17 178 lb (80.7 kg)  07/09/16 169 lb (76.7 kg)  06/22/15 166 lb (75.3 kg)    Physical Exam  Constitutional: She appears well-developed. No distress.  HENT:  Head: Normocephalic.  Right Ear: External ear normal.  Left Ear: External ear normal.  Nose: Nose normal.  Mouth/Throat: Oropharynx is clear and moist.  Eyes: Conjunctivae are normal. Pupils are equal, round, and reactive to light. Right eye exhibits no discharge. Left eye exhibits no discharge.  Neck: Normal range of motion. Neck supple. No JVD present. No tracheal deviation present. No thyromegaly present.  Cardiovascular: Normal rate, regular rhythm and normal heart sounds.  Pulmonary/Chest: No stridor. No respiratory distress. She has no wheezes.  Abdominal: Soft. Bowel sounds are normal. She exhibits no distension and no mass. There is no tenderness. There is no rebound and no guarding.  Musculoskeletal: She exhibits tenderness. She exhibits no edema.  Lymphadenopathy:    She has no cervical adenopathy.  Neurological: She displays normal reflexes. No cranial nerve deficit. She exhibits normal muscle tone. Coordination normal.  Skin: Rash noted. There is erythema.  Psychiatric: She has a normal mood and affect. Her behavior is normal. Judgment and thought content normal.  a large eryth patch under L breast w/several 2-3 mm erosions  Lab Results  Component Value Date   WBC 7.6 07/09/2016   HGB 12.5 07/09/2016   HCT 36.5 07/09/2016   PLT  390.0 07/09/2016   GLUCOSE 107 (H) 07/09/2016   CHOL 212 (H) 07/09/2016   TRIG 53.0 07/09/2016   HDL 67.00 07/09/2016   LDLDIRECT 156.2 03/15/2010   LDLCALC 134 (H) 07/09/2016   ALT 18 07/09/2016   AST 17 07/09/2016   NA 140 07/09/2016   K 3.7 07/09/2016   CL 107 07/09/2016   CREATININE 0.58 07/09/2016   BUN 16 07/09/2016   CO2 26 07/09/2016   TSH 0.30 (L) 07/09/2016    US Breast Ltd Uni Right Inc Axilla  Result Date: 10/30/2016 CLINICAL  DATA:  Patient was called back from screening mammogram for a mass in the right breast. EXAM: 2D DIGITAL DIAGNOSTIC RIGHT MAMMOGRAM WITH CAD AND ADJUNCT TOMO ULTRASOUND RIGHT BREAST COMPARISON:  Previous exam(s). ACR Breast Density Category b: There are scattered areas of fibroglandular density. FINDINGS: Additional images of the right breast were obtained. There is an 6 mm mass in the upper-inner quadrant of the anterior third of the breast. There are no malignant type microcalcifications. Mammographic images were processed with CAD. On physical exam, no mass is palpated in the upper-inner quadrant of the right breast. Targeted ultrasound is performed, showing a cluster of near anechoic cysts in the right breast at 2-3 o'clock 4 cm from the nipple measuring 9 x 6 x 7 mm. IMPRESSION: Probable benign cluster of cysts in the right breast. RECOMMENDATION: Short-term interval follow-up right breast ultrasound in 6 months is recommended. I have discussed the findings and recommendations with the patient. Results were also provided in writing at the conclusion of the visit. If applicable, a reminder letter will be sent to the patient regarding the next appointment. BI-RADS CATEGORY  3: Probably benign. Electronically Signed   By: Lillia Mountain M.D.   On: 10/30/2016 08:22   Mm Diag Breast Tomo Uni Right  Result Date: 10/30/2016 CLINICAL DATA:  Patient was called back from screening mammogram for a mass in the right breast. EXAM: 2D DIGITAL DIAGNOSTIC RIGHT MAMMOGRAM WITH CAD AND ADJUNCT TOMO ULTRASOUND RIGHT BREAST COMPARISON:  Previous exam(s). ACR Breast Density Category b: There are scattered areas of fibroglandular density. FINDINGS: Additional images of the right breast were obtained. There is an 6 mm mass in the upper-inner quadrant of the anterior third of the breast. There are no malignant type microcalcifications. Mammographic images were processed with CAD. On physical exam, no mass is palpated in the  upper-inner quadrant of the right breast. Targeted ultrasound is performed, showing a cluster of near anechoic cysts in the right breast at 2-3 o'clock 4 cm from the nipple measuring 9 x 6 x 7 mm. IMPRESSION: Probable benign cluster of cysts in the right breast. RECOMMENDATION: Short-term interval follow-up right breast ultrasound in 6 months is recommended. I have discussed the findings and recommendations with the patient. Results were also provided in writing at the conclusion of the visit. If applicable, a reminder letter will be sent to the patient regarding the next appointment. BI-RADS CATEGORY  3: Probably benign. Electronically Signed   By: Lillia Mountain M.D.   On: 10/30/2016 08:22    Assessment & Plan:   There are no diagnoses linked to this encounter. I have discontinued Sheila Lin's vitamin C, Cholecalciferol, and valACYclovir. I am also having her maintain her atenolol and CALCIUM PO.  No orders of the defined types were placed in this encounter.    Follow-up: No Follow-up on file.  Walker Kehr, MD

## 2017-07-12 NOTE — Assessment & Plan Note (Signed)
Lotrisone Rx

## 2017-07-12 NOTE — Patient Instructions (Signed)
Shingles Shingles, which is also known as herpes zoster, is an infection that causes a painful skin rash and fluid-filled blisters. Shingles is not related to genital herpes, which is a sexually transmitted infection. Shingles only develops in people who:  Have had chickenpox.  Have received the chickenpox vaccine. (This is rare.)  What are the causes? Shingles is caused by varicella-zoster virus (VZV). This is the same virus that causes chickenpox. After exposure to VZV, the virus stays in the body in an inactive (dormant) state. Shingles develops if the virus reactivates. This can happen many years after the initial exposure to VZV. It is not known what causes this virus to reactivate. What increases the risk? People who have had chickenpox or received the chickenpox vaccine are at risk for shingles. Infection is more common in people who:  Are older than age 50.  Have a weakened defense (immune) system, such as those with HIV, AIDS, or cancer.  Are taking medicines that weaken the immune system, such as transplant medicines.  Are under great stress.  What are the signs or symptoms? Early symptoms of this condition include itching, tingling, and pain in an area on your skin. Pain may be described as burning, stabbing, or throbbing. A few days or weeks after symptoms start, a painful red rash appears, usually on one side of the body in a bandlike or beltlike pattern. The rash eventually turns into fluid-filled blisters that break open, scab over, and dry up in about 2-3 weeks. At any time during the infection, you may also develop:  A fever.  Chills.  A headache.  An upset stomach.  How is this diagnosed? This condition is diagnosed with a skin exam. Sometimes, skin or fluid samples are taken from the blisters before a diagnosis is made. These samples are examined under a microscope or sent to a lab for testing. How is this treated? There is no specific cure for this condition.  Your health care provider will probably prescribe medicines to help you manage pain, recover more quickly, and avoid long-term problems. Medicines may include:  Antiviral drugs.  Anti-inflammatory drugs.  Pain medicines.  If the area involved is on your face, you may be referred to a specialist, such as an eye doctor (ophthalmologist) or an ear, nose, and throat (ENT) doctor to help you avoid eye problems, chronic pain, or disability. Follow these instructions at home: Medicines  Take medicines only as directed by your health care provider.  Apply an anti-itch or numbing cream to the affected area as directed by your health care provider. Blister and Rash Care  Take a cool bath or apply cool compresses to the area of the rash or blisters as directed by your health care provider. This may help with pain and itching.  Keep your rash covered with a loose bandage (dressing). Wear loose-fitting clothing to help ease the pain of material rubbing against the rash.  Keep your rash and blisters clean with mild soap and cool water or as directed by your health care provider.  Check your rash every day for signs of infection. These include redness, swelling, and pain that lasts or increases.  Do not pick your blisters.  Do not scratch your rash. General instructions  Rest as directed by your health care provider.  Keep all follow-up visits as directed by your health care provider. This is important.  Until your blisters scab over, your infection can cause chickenpox in people who have never had it or been vaccinated   against it. To prevent this from happening, avoid contact with other people, especially: ? Babies. ? Pregnant women. ? Children who have eczema. ? Elderly people who have transplants. ? People who have chronic illnesses, such as leukemia or AIDS. Contact a health care provider if:  Your pain is not relieved with prescribed medicines.  Your pain does not get better after  the rash heals.  Your rash looks infected. Signs of infection include redness, swelling, and pain that lasts or increases. Get help right away if:  The rash is on your face or nose.  You have facial pain, pain around your eye area, or loss of feeling on one side of your face.  You have ear pain or you have ringing in your ear.  You have loss of taste.  Your condition gets worse. This information is not intended to replace advice given to you by your health care provider. Make sure you discuss any questions you have with your health care provider. Document Released: 06/11/2005 Document Revised: 02/05/2016 Document Reviewed: 04/22/2014 Elsevier Interactive Patient Education  2018 [employer]  

## 2017-07-12 NOTE — Assessment & Plan Note (Signed)
Due to Zoster Percocet prn  Potential benefits of opioids use as well as potential risks (i.e. addiction risk, apnea etc) and complications (i.e. Somnolence, constipation and others) were explained to the patient and were aknowledged. Toradol IM Gabapentin po

## 2017-07-15 ENCOUNTER — Encounter: Payer: Self-pay | Admitting: Internal Medicine

## 2017-07-15 ENCOUNTER — Ambulatory Visit (INDEPENDENT_AMBULATORY_CARE_PROVIDER_SITE_OTHER): Payer: 59 | Admitting: Internal Medicine

## 2017-07-15 VITALS — BP 128/82 | HR 67 | Temp 98.9°F | Ht 68.0 in | Wt 178.0 lb

## 2017-07-15 DIAGNOSIS — R21 Rash and other nonspecific skin eruption: Secondary | ICD-10-CM

## 2017-07-15 DIAGNOSIS — Z0000 Encounter for general adult medical examination without abnormal findings: Secondary | ICD-10-CM

## 2017-07-15 DIAGNOSIS — R0789 Other chest pain: Secondary | ICD-10-CM

## 2017-07-15 DIAGNOSIS — E559 Vitamin D deficiency, unspecified: Secondary | ICD-10-CM

## 2017-07-15 MED ORDER — ATENOLOL 50 MG PO TABS: 50.0000 mg | ORAL_TABLET | Freq: Every day | ORAL | 3 refills | Status: DC

## 2017-07-15 NOTE — Assessment & Plan Note (Signed)
On Vit D 

## 2017-07-15 NOTE — Patient Instructions (Signed)
shingrix vaccine

## 2017-07-15 NOTE — Assessment & Plan Note (Signed)
We discussed age appropriate health related issues, including available/recomended screening tests and vaccinations. We discussed a need for adhering to healthy diet and exercise. Labs/EKG were reviewed/ordered. All questions were answered. Colon 2011 GYN q 2 years Derm - q 12 mo 

## 2017-07-15 NOTE — Progress Notes (Signed)
Subjective:  Patient ID: Sheila Lin, female    DOB: [birth year]  Age: 57 y.o. MRN: [id number]  CC: No chief complaint on file.   HPI Sheila Lin presents for a well exam. Rash/pain - much better  Outpatient Medications Prior to Visit  Medication Sig Dispense Refill  . CALCIUM PO Take by mouth.    . clotrimazole-betamethasone (LOTRISONE) cream Apply 1 application topically 2 (two) times daily. 45 g 3  . gabapentin (NEURONTIN) 100 MG capsule Take 1-2 capsules (100-200 mg total) by mouth 3 (three) times daily. 90 capsule 3  . oxyCODONE-acetaminophen (PERCOCET) 7.5-325 MG tablet Take 1 tablet by mouth every 6 (six) hours as needed for severe pain. 28 tablet 0  . valACYclovir (VALTREX) 1000 MG tablet Take 1 tablet (1,000 mg total) by mouth 3 (three) times daily. 28 tablet 0  . atenolol (TENORMIN) 50 MG tablet Take 1 tablet (50 mg total) by mouth daily. 90 tablet 3   No facility-administered medications prior to visit.     ROS Review of Systems  Constitutional: Negative for activity change, appetite change, chills, fatigue and unexpected weight change.  HENT: Negative for congestion, mouth sores and sinus pressure.   Eyes: Negative for visual disturbance.  Respiratory: Negative for cough, chest tightness and shortness of breath.   Cardiovascular: Positive for chest pain.  Gastrointestinal: Negative for abdominal pain and nausea.  Genitourinary: Negative for difficulty urinating, frequency and vaginal pain.  Musculoskeletal: Negative for back pain and gait problem.  Skin: Positive for rash. Negative for pallor.  Neurological: Negative for dizziness, tremors, weakness, numbness and headaches.  Psychiatric/Behavioral: Negative for confusion and sleep disturbance.    Objective:  BP 128/82 (BP Location: Left Arm, Patient Position: Sitting, Cuff Size: Large)   Pulse 67   Temp 98.9 F (37.2 C) (Oral)   Ht 5\' 8"  (1.727 m)   Wt 178 lb (80.7 kg)   SpO2 100%   BMI 27.06  kg/m   BP Readings from Last 3 Encounters:  07/15/17 128/82  07/12/17 124/82  07/09/16 110/84    Wt Readings from Last 3 Encounters:  07/15/17 178 lb (80.7 kg)  07/12/17 178 lb (80.7 kg)  07/09/16 169 lb (76.7 kg)    Physical Exam  Constitutional: She appears well-developed. No distress.  HENT:  Head: Normocephalic.  Right Ear: External ear normal.  Left Ear: External ear normal.  Nose: Nose normal.  Mouth/Throat: Oropharynx is clear and moist.  Eyes: Conjunctivae are normal. Pupils are equal, round, and reactive to light. Right eye exhibits no discharge. Left eye exhibits no discharge.  Neck: Normal range of motion. Neck supple. No JVD present. No tracheal deviation present. No thyromegaly present.  Cardiovascular: Normal rate, regular rhythm and normal heart sounds.  Pulmonary/Chest: No stridor. No respiratory distress. She has no wheezes.  Abdominal: Soft. Bowel sounds are normal. She exhibits no distension and no mass. There is no tenderness. There is no rebound and no guarding.  Musculoskeletal: She exhibits no edema or tenderness.  Lymphadenopathy:    She has no cervical adenopathy.  Neurological: She displays normal reflexes. No cranial nerve deficit. She exhibits normal muscle tone. Coordination normal.  Skin: Rash noted. No erythema.  Psychiatric: She has a normal mood and affect. Her behavior is normal. Judgment and thought content normal.  rash under L breast is much better  Lab Results  Component Value Date   WBC 7.6 07/09/2016   HGB 12.5 07/09/2016   HCT 36.5 07/09/2016   PLT 390.0  07/09/2016   GLUCOSE 107 (H) 07/09/2016   CHOL 212 (H) 07/09/2016   TRIG 53.0 07/09/2016   HDL 67.00 07/09/2016   LDLDIRECT 156.2 03/15/2010   LDLCALC 134 (H) 07/09/2016   ALT 18 07/09/2016   AST 17 07/09/2016   NA 140 07/09/2016   K 3.7 07/09/2016   CL 107 07/09/2016   CREATININE 0.58 07/09/2016   BUN 16 07/09/2016   CO2 26 07/09/2016   TSH 0.30 (L) 07/09/2016    US  Breast Ltd Uni Right Inc Axilla  Result Date: 10/30/2016 CLINICAL DATA:  Patient was called back from screening mammogram for a mass in the right breast. EXAM: 2D DIGITAL DIAGNOSTIC RIGHT MAMMOGRAM WITH CAD AND ADJUNCT TOMO ULTRASOUND RIGHT BREAST COMPARISON:  Previous exam(s). ACR Breast Density Category b: There are scattered areas of fibroglandular density. FINDINGS: Additional images of the right breast were obtained. There is an 6 mm mass in the upper-inner quadrant of the anterior third of the breast. There are no malignant type microcalcifications. Mammographic images were processed with CAD. On physical exam, no mass is palpated in the upper-inner quadrant of the right breast. Targeted ultrasound is performed, showing a cluster of near anechoic cysts in the right breast at 2-3 o'clock 4 cm from the nipple measuring 9 x 6 x 7 mm. IMPRESSION: Probable benign cluster of cysts in the right breast. RECOMMENDATION: Short-term interval follow-up right breast ultrasound in 6 months is recommended. I have discussed the findings and recommendations with the patient. Results were also provided in writing at the conclusion of the visit. If applicable, a reminder letter will be sent to the patient regarding the next appointment. BI-RADS CATEGORY  3: Probably benign. Electronically Signed   By: Lillia Mountain M.D.   On: 10/30/2016 08:22   Mm Diag Breast Tomo Uni Right  Result Date: 10/30/2016 CLINICAL DATA:  Patient was called back from screening mammogram for a mass in the right breast. EXAM: 2D DIGITAL DIAGNOSTIC RIGHT MAMMOGRAM WITH CAD AND ADJUNCT TOMO ULTRASOUND RIGHT BREAST COMPARISON:  Previous exam(s). ACR Breast Density Category b: There are scattered areas of fibroglandular density. FINDINGS: Additional images of the right breast were obtained. There is an 6 mm mass in the upper-inner quadrant of the anterior third of the breast. There are no malignant type microcalcifications. Mammographic images were  processed with CAD. On physical exam, no mass is palpated in the upper-inner quadrant of the right breast. Targeted ultrasound is performed, showing a cluster of near anechoic cysts in the right breast at 2-3 o'clock 4 cm from the nipple measuring 9 x 6 x 7 mm. IMPRESSION: Probable benign cluster of cysts in the right breast. RECOMMENDATION: Short-term interval follow-up right breast ultrasound in 6 months is recommended. I have discussed the findings and recommendations with the patient. Results were also provided in writing at the conclusion of the visit. If applicable, a reminder letter will be sent to the patient regarding the next appointment. BI-RADS CATEGORY  3: Probably benign. Electronically Signed   By: Lillia Mountain M.D.   On: 10/30/2016 08:22    Assessment & Plan:   Diagnoses and all orders for this visit:  Well adult exam -     TSH; Future -     Urinalysis; Future -     CBC with Differential/Platelet; Future -     Lipid panel; Future -     Basic metabolic panel; Future -     Hepatic function panel; Future  Vitamin D deficiency -  TSH; Future -     Urinalysis; Future -     CBC with Differential/Platelet; Future -     Lipid panel; Future -     Basic metabolic panel; Future -     Hepatic function panel; Future  Rash -     TSH; Future -     Urinalysis; Future -     CBC with Differential/Platelet; Future -     Lipid panel; Future -     Basic metabolic panel; Future -     Hepatic function panel; Future  Chest wall pain -     TSH; Future -     Urinalysis; Future -     CBC with Differential/Platelet; Future -     Lipid panel; Future -     Basic metabolic panel; Future -     Hepatic function panel; Future  Other orders -     atenolol (TENORMIN) 50 MG tablet; Take 1 tablet (50 mg total) by mouth daily.   I am having Sheila Lin maintain her CALCIUM PO, valACYclovir, gabapentin, oxyCODONE-acetaminophen, clotrimazole-betamethasone, and atenolol.  Meds ordered  this encounter  Medications  . atenolol (TENORMIN) 50 MG tablet    Sig: Take 1 tablet (50 mg total) by mouth daily.    Dispense:  90 tablet    Refill:  3     Follow-up: Return in about 1 year (around 07/15/2018) for Wellness Exam.  Walker Kehr, MD

## 2017-07-15 NOTE — Assessment & Plan Note (Signed)
Better  Cont w/Valtrex, Gabapentin, Percocet prn

## 2017-07-15 NOTE — Assessment & Plan Note (Signed)
better 

## 2017-11-19 ENCOUNTER — Telehealth: Payer: Self-pay | Admitting: Internal Medicine

## 2017-11-19 NOTE — Telephone Encounter (Signed)
Please disregard this. The forms the patient are form needs to be in her chart and her pcp needs to fill this out.

## 2017-11-19 NOTE — Telephone Encounter (Signed)
>>   Nov 15, 2017 10:32 AM Oneta Rack wrote: Relation to pt: self  Call back number: [phone]    Reason for call:  Patient scheduled FMLA appointment with PCP for 11/27/17. Patient would like to be placed on FMLA due to taking care of her daughter [name] who has been dx with Muscular dystroph. Patient daughter [name] ([id number]), is a patient of Dr Cathlean Cower.   Patient states when care giver is unable to take care of her daughter patient has to call off work, please advise if appointment is needed or can paperwork be dropped off, patient informed turn around time 5 to 7 business day and PCP will be on vacation for a week and a few days. FMLA deadline 12/05/17.    FYI: ** Patient has set up an appointment for this on 11/27/17.

## 2017-11-20 DIAGNOSIS — Z0279 Encounter for issue of other medical certificate: Secondary | ICD-10-CM

## 2017-11-20 NOTE — Telephone Encounter (Signed)
Forms have been completed & charged for. The forms are in the daughters chart, due to her information being on the forms.

## 2017-11-27 ENCOUNTER — Ambulatory Visit: Payer: 59 | Admitting: Internal Medicine

## 2018-02-27 DIAGNOSIS — Z0279 Encounter for issue of other medical certificate: Secondary | ICD-10-CM

## 2018-03-03 ENCOUNTER — Encounter: Payer: Self-pay | Admitting: Internal Medicine

## 2018-03-03 ENCOUNTER — Ambulatory Visit (INDEPENDENT_AMBULATORY_CARE_PROVIDER_SITE_OTHER): Payer: 59 | Admitting: Internal Medicine

## 2018-03-03 DIAGNOSIS — M542 Cervicalgia: Secondary | ICD-10-CM | POA: Diagnosis not present

## 2018-03-03 DIAGNOSIS — E559 Vitamin D deficiency, unspecified: Secondary | ICD-10-CM

## 2018-03-03 MED ORDER — OXYCODONE-ACETAMINOPHEN 7.5-325 MG PO TABS: 1.0000 | ORAL_TABLET | Freq: Four times a day (QID) | ORAL | 0 refills | Status: DC | PRN

## 2018-03-03 MED ORDER — KETOROLAC TROMETHAMINE 60 MG/2ML IM SOLN
60.0000 mg | Freq: Once | INTRAMUSCULAR | Status: AC
  Administered 2018-03-03: 60 mg via INTRAMUSCULAR

## 2018-03-03 MED ORDER — CYCLOBENZAPRINE HCL 5 MG PO TABS: 5.0000 mg | ORAL_TABLET | Freq: Three times a day (TID) | ORAL | 1 refills | Status: DC | PRN

## 2018-03-03 MED ORDER — VITAMIN D3 50 MCG (2000 UT) PO CAPS: 2000.0000 [IU] | ORAL_CAPSULE | Freq: Every day | ORAL | 3 refills | Status: DC

## 2018-03-03 NOTE — Addendum Note (Signed)
Addended by: Karren Cobble on: 03/03/2018 03:17 PM   Modules accepted: Orders

## 2018-03-03 NOTE — Assessment & Plan Note (Addendum)
Severe MSK recurrent Percocet prn Flexeril prn Heat/ice Massage Toradol IM

## 2018-03-03 NOTE — Assessment & Plan Note (Signed)
Increase Vit D
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
## 2018-04-17 ENCOUNTER — Other Ambulatory Visit: Payer: Self-pay | Admitting: Internal Medicine

## 2018-06-06 ENCOUNTER — Emergency Department (HOSPITAL_COMMUNITY)
Admission: EM | Admit: 2018-06-06 | Discharge: 2018-06-06 | Disposition: A | Discharge status: Home / Self Care | Payer: 59 | Attending: Emergency Medicine | Admitting: Emergency Medicine

## 2018-06-06 ENCOUNTER — Other Ambulatory Visit: Payer: Self-pay

## 2018-06-06 ENCOUNTER — Emergency Department (HOSPITAL_COMMUNITY): Payer: 59

## 2018-06-06 ENCOUNTER — Encounter (HOSPITAL_COMMUNITY): Payer: Self-pay

## 2018-06-06 DIAGNOSIS — Y939 Activity, unspecified: Secondary | ICD-10-CM | POA: Insufficient documentation

## 2018-06-06 DIAGNOSIS — Z79899 Other long term (current) drug therapy: Secondary | ICD-10-CM | POA: Diagnosis not present

## 2018-06-06 DIAGNOSIS — Y929 Unspecified place or not applicable: Secondary | ICD-10-CM | POA: Diagnosis not present

## 2018-06-06 DIAGNOSIS — Y999 Unspecified external cause status: Secondary | ICD-10-CM | POA: Insufficient documentation

## 2018-06-06 DIAGNOSIS — R0789 Other chest pain: Secondary | ICD-10-CM | POA: Diagnosis present

## 2018-06-06 DIAGNOSIS — S2220XA Unspecified fracture of sternum, initial encounter for closed fracture: Secondary | ICD-10-CM | POA: Diagnosis not present

## 2018-06-06 LAB — I-STAT CHEM 8, ED
BUN: 18 mg/dL (ref 6–20)
CHLORIDE: 108 mmol/L (ref 98–111)
CREATININE: 0.5 mg/dL (ref 0.44–1.00)
Calcium, Ion: 1.13 mmol/L — ABNORMAL LOW (ref 1.15–1.40)
Glucose, Bld: 146 mg/dL — ABNORMAL HIGH (ref 70–99)
HEMATOCRIT: 34 % — AB (ref 36.0–46.0)
HEMOGLOBIN: 11.6 g/dL — AB (ref 12.0–15.0)
POTASSIUM: 3.9 mmol/L (ref 3.5–5.1)
Sodium: 139 mmol/L (ref 135–145)
TCO2: 23 mmol/L (ref 22–32)

## 2018-06-06 MED ORDER — IOHEXOL 300 MG/ML  SOLN
75.0000 mL | Freq: Once | INTRAMUSCULAR | Status: AC | PRN
  Administered 2018-06-06: 75 mL via INTRAVENOUS

## 2018-06-06 MED ORDER — OXYCODONE-ACETAMINOPHEN 5-325 MG PO TABS
1.0000 | ORAL_TABLET | Freq: Once | ORAL | Status: AC
  Administered 2018-06-06: 1 via ORAL
  Filled 2018-06-06: qty 1

## 2018-06-06 MED ORDER — METHOCARBAMOL 500 MG PO TABS: 500.0000 mg | ORAL_TABLET | Freq: Two times a day (BID) | ORAL | 0 refills | Status: DC

## 2018-06-06 MED ORDER — FENTANYL CITRATE (PF) 100 MCG/2ML IJ SOLN
50.0000 ug | Freq: Once | INTRAMUSCULAR | Status: AC
  Administered 2018-06-06: 50 ug via INTRAVENOUS
  Filled 2018-06-06: qty 2

## 2018-06-06 MED ORDER — OXYCODONE-ACETAMINOPHEN 5-325 MG PO TABS: 2.0000 | ORAL_TABLET | ORAL | 0 refills | Status: DC | PRN
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
## 2018-06-06 NOTE — ED Notes (Signed)
Gave patient incentive spirometer   

## 2018-06-06 NOTE — Discharge Instructions (Signed)
Take Percocet as directed Use Robaxin for muscle pain and spasms Use incentive spirometer 4 times daily to help prevent pneumonia Follow up with your doctor Return if worsening

## 2018-06-16 ENCOUNTER — Ambulatory Visit (INDEPENDENT_AMBULATORY_CARE_PROVIDER_SITE_OTHER): Payer: 59 | Admitting: Internal Medicine

## 2018-06-16 ENCOUNTER — Encounter: Payer: Self-pay | Admitting: Internal Medicine

## 2018-06-16 DIAGNOSIS — F410 Panic disorder [episodic paroxysmal anxiety] without agoraphobia: Secondary | ICD-10-CM | POA: Diagnosis not present

## 2018-06-16 DIAGNOSIS — R0789 Other chest pain: Secondary | ICD-10-CM | POA: Diagnosis not present

## 2018-06-16 DIAGNOSIS — R1011 Right upper quadrant pain: Secondary | ICD-10-CM | POA: Diagnosis not present

## 2018-06-16 MED ORDER — OXYCODONE-ACETAMINOPHEN 5-325 MG PO TABS: 1.0000 | ORAL_TABLET | Freq: Three times a day (TID) | ORAL | 0 refills | Status: DC | PRN

## 2018-06-16 MED ORDER — CYCLOBENZAPRINE HCL 5 MG PO TABS: 5.0000 mg | ORAL_TABLET | Freq: Three times a day (TID) | ORAL | 1 refills | Status: DC | PRN

## 2018-06-16 MED ORDER — LORAZEPAM 0.5 MG PO TABS: 0.5000 mg | ORAL_TABLET | Freq: Two times a day (BID) | ORAL | 1 refills | Status: DC | PRN
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
## 2018-06-27 ENCOUNTER — Telehealth: Payer: Self-pay
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
## 2018-06-30 ENCOUNTER — Ambulatory Visit: Payer: 59 | Admitting: Internal Medicine

## 2018-06-30 ENCOUNTER — Ambulatory Visit (INDEPENDENT_AMBULATORY_CARE_PROVIDER_SITE_OTHER): Payer: 59 | Admitting: Family Medicine

## 2018-06-30 ENCOUNTER — Encounter: Payer: Self-pay | Admitting: Family Medicine

## 2018-06-30 ENCOUNTER — Other Ambulatory Visit (INDEPENDENT_AMBULATORY_CARE_PROVIDER_SITE_OTHER): Payer: 59

## 2018-06-30 VITALS — BP 118/82 | HR 73 | Ht 68.0 in | Wt 175.0 lb

## 2018-06-30 DIAGNOSIS — M2550 Pain in unspecified joint: Secondary | ICD-10-CM

## 2018-06-30 DIAGNOSIS — R6889 Other general symptoms and signs: Secondary | ICD-10-CM | POA: Diagnosis not present

## 2018-06-30 LAB — COMPREHENSIVE METABOLIC PANEL
ALBUMIN: 4.2 g/dL (ref 3.5–5.2)
ALT: 19 U/L (ref 0–35)
AST: 16 U/L (ref 0–37)
Alkaline Phosphatase: 77 U/L (ref 39–117)
BUN: 19 mg/dL (ref 6–23)
CO2: 27 mEq/L (ref 19–32)
Calcium: 9.3 mg/dL (ref 8.4–10.5)
Chloride: 108 mEq/L (ref 96–112)
Creatinine, Ser: 0.51 mg/dL (ref 0.40–1.20)
GFR: 131.2 mL/min (ref >60.00)
Glucose, Bld: 108 mg/dL — ABNORMAL HIGH (ref 70–99)
POTASSIUM: 3.2 meq/L — AB (ref 3.5–5.1)
Sodium: 144 mEq/L (ref 135–145)
Total Bilirubin: 0.5 mg/dL (ref 0.2–1.2)
Total Protein: 6.9 g/dL (ref 6.0–8.3)

## 2018-06-30 LAB — C-REACTIVE PROTEIN: CRP: 0.1 mg/dL — ABNORMAL LOW (ref 0.5–20.0)

## 2018-06-30 LAB — IBC PANEL
Iron: 121 ug/dL (ref 42–145)
Saturation Ratios: 47 % (ref 20.0–50.0)
Transferrin: 184 mg/dL — ABNORMAL LOW (ref 212.0–360.0)

## 2018-06-30 LAB — URIC ACID: Uric Acid, Serum: 3.8 mg/dL (ref 2.4–7.0)

## 2018-06-30 LAB — FERRITIN: Ferritin: 615.2 ng/mL — ABNORMAL HIGH (ref 10.0–291.0)

## 2018-06-30 LAB — SEDIMENTATION RATE: Sed Rate: 21 mm/hr (ref 0–30)

## 2018-06-30 LAB — VITAMIN D 25 HYDROXY (VIT D DEFICIENCY, FRACTURES): VITD: 39.63 ng/mL (ref 30.00–100.00)

## 2018-06-30 MED ORDER — HYDROXYZINE HCL 10 MG PO TABS: 10.0000 mg | ORAL_TABLET | Freq: Three times a day (TID) | ORAL | 0 refills | Status: DC | PRN

## 2018-06-30 NOTE — Assessment & Plan Note (Signed)
2 episodes of forgetfulness.  Past medical history significant for anemia, anxiety as well.  Patient has had recent loss of family member as well.  I believe that stress likely contributed to some of these different fugax episodes.  Patient is in agreement that that could have been possible.  Do not feel that imaging is warranted but possible laboratory work-up to rule out other pathology that could be contributing to it including B12, thyroid, anemia with labs.  Depending on findings we will discuss if any other supplementations may be necessary.  Patient was given the option to decrease amount of work which patient declined.  Feels like going back to work would be beneficial.  Following up with me again in 2 to 3 weeks for further evaluation.

## 2018-06-30 NOTE — Patient Instructions (Addendum)
Good to see you  We will get labs  Hydroxyzine up to 3 times a day for anxiety  I am sorry for the loss of your mother in law Lets start with  Vitamin D 2000 IU daily  B12 1063mcg daily  B6 100mg  daily  Choline 500mg  daily  Stay active See me again in 2-3 weeks to make sure all better  Happy New Year!
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
## 2018-07-02 ENCOUNTER — Ambulatory Visit: Payer: 59 | Admitting: Internal Medicine

## 2018-07-02 LAB — RHEUMATOID FACTOR: Rheumatoid fact SerPl-aCnc: <14 IU/mL (ref <14)

## 2018-07-02 LAB — ANTI-NUCLEAR AB-TITER (ANA TITER): ANA Titer 1: 1:80 {titer} — ABNORMAL HIGH

## 2018-07-02 LAB — CYCLIC CITRUL PEPTIDE ANTIBODY, IGG: Cyclic Citrullin Peptide Ab: <16 UNITS

## 2018-07-02 LAB — ANGIOTENSIN CONVERTING ENZYME: Angiotensin-Converting Enzyme: 14 U/L (ref 9–67)

## 2018-07-02 LAB — ANA: ANA: POSITIVE — AB

## 2018-07-02 LAB — PTH, INTACT AND CALCIUM
Calcium: 9.4 mg/dL (ref 8.6–10.4)
PTH: 59 pg/mL (ref 14–64)

## 2018-07-02 LAB — CALCIUM, IONIZED: Calcium, Ion: 5.2 mg/dL (ref 4.8–5.6)

## 2018-07-17 ENCOUNTER — Telehealth: Payer: Self-pay | Admitting: Internal Medicine

## 2018-07-17 ENCOUNTER — Ambulatory Visit (INDEPENDENT_AMBULATORY_CARE_PROVIDER_SITE_OTHER): Payer: 59 | Admitting: Internal Medicine

## 2018-07-17 ENCOUNTER — Ambulatory Visit
Admission: RE | Admit: 2018-07-17 | Discharge: 2018-07-17 | Disposition: A | Discharge status: Home / Self Care | Payer: 59 | Source: Ambulatory Visit | Attending: Internal Medicine | Admitting: Internal Medicine

## 2018-07-17 ENCOUNTER — Other Ambulatory Visit (INDEPENDENT_AMBULATORY_CARE_PROVIDER_SITE_OTHER): Payer: 59

## 2018-07-17 ENCOUNTER — Encounter: Payer: Self-pay | Admitting: Internal Medicine

## 2018-07-17 VITALS — BP 138/84 | HR 78 | Temp 98.5°F | Ht 68.0 in | Wt 177.0 lb

## 2018-07-17 DIAGNOSIS — F419 Anxiety disorder, unspecified: Secondary | ICD-10-CM

## 2018-07-17 DIAGNOSIS — R0789 Other chest pain: Secondary | ICD-10-CM | POA: Diagnosis not present

## 2018-07-17 DIAGNOSIS — Z0000 Encounter for general adult medical examination without abnormal findings: Secondary | ICD-10-CM

## 2018-07-17 DIAGNOSIS — R1011 Right upper quadrant pain: Secondary | ICD-10-CM

## 2018-07-17 DIAGNOSIS — F410 Panic disorder [episodic paroxysmal anxiety] without agoraphobia: Secondary | ICD-10-CM

## 2018-07-17 LAB — CBC WITH DIFFERENTIAL/PLATELET
Basophils Absolute: 0.1 10*3/uL (ref 0.0–0.1)
Basophils Relative: 1.5 % (ref 0.0–3.0)
Eosinophils Absolute: 0.1 10*3/uL (ref 0.0–0.7)
Eosinophils Relative: 1.4 % (ref 0.0–5.0)
HCT: 34.9 % — ABNORMAL LOW (ref 36.0–46.0)
HEMOGLOBIN: 11.8 g/dL — AB (ref 12.0–15.0)
Lymphocytes Relative: 30.4 % (ref 12.0–46.0)
Lymphs Abs: 2.2 10*3/uL (ref 0.7–4.0)
MCHC: 33.7 g/dL (ref 30.0–36.0)
MCV: 89.3 fl (ref 78.0–100.0)
Monocytes Absolute: 0.5 10*3/uL (ref 0.1–1.0)
Monocytes Relative: 6.8 % (ref 3.0–12.0)
Neutro Abs: 4.4 10*3/uL (ref 1.4–7.7)
Neutrophils Relative %: 59.9 % (ref 43.0–77.0)
Platelets: 378 10*3/uL (ref 150.0–400.0)
RBC: 3.91 Mil/uL (ref 3.87–5.11)
RDW: 13.8 % (ref 11.5–15.5)
WBC: 7.3 10*3/uL (ref 4.0–10.5)

## 2018-07-17 LAB — URINALYSIS, ROUTINE W REFLEX MICROSCOPIC
BILIRUBIN URINE: NEGATIVE
Nitrite: NEGATIVE
Specific Gravity, Urine: 1.025 (ref 1.000–1.030)
TOTAL PROTEIN, URINE-UPE24: NEGATIVE
Urine Glucose: NEGATIVE
Urobilinogen, UA: 0.2 (ref 0.0–1.0)
pH: 5.5 (ref 5.0–8.0)

## 2018-07-17 LAB — HEPATIC FUNCTION PANEL
ALT: 18 U/L (ref 0–35)
AST: 13 U/L (ref 0–37)
Albumin: 4.3 g/dL (ref 3.5–5.2)
Alkaline Phosphatase: 71 U/L (ref 39–117)
Bilirubin, Direct: 0.1 mg/dL (ref 0.0–0.3)
Total Bilirubin: 0.7 mg/dL (ref 0.2–1.2)
Total Protein: 7 g/dL (ref 6.0–8.3)

## 2018-07-17 LAB — BASIC METABOLIC PANEL
BUN: 11 mg/dL (ref 6–23)
CALCIUM: 10 mg/dL (ref 8.4–10.5)
CO2: 28 mEq/L (ref 19–32)
Chloride: 107 mEq/L (ref 96–112)
Creatinine, Ser: 0.49 mg/dL (ref 0.40–1.20)
GFR: 129.25 mL/min (ref >60.00)
Glucose, Bld: 98 mg/dL (ref 70–99)
Potassium: 3.6 mEq/L (ref 3.5–5.1)
Sodium: 142 mEq/L (ref 135–145)

## 2018-07-17 LAB — LIPID PANEL
Cholesterol: 183 mg/dL (ref 0–200)
HDL: 53.1 mg/dL (ref >39.00)
LDL Cholesterol: 114 mg/dL — ABNORMAL HIGH (ref 0–99)
NonHDL: 130.23
Total CHOL/HDL Ratio: 3
Triglycerides: 81 mg/dL (ref 0.0–149.0)
VLDL: 16.2 mg/dL (ref 0.0–40.0)

## 2018-07-17 LAB — TSH: TSH: 0.42 u[IU]/mL (ref 0.35–4.50)

## 2018-07-17 MED ORDER — CYCLOBENZAPRINE HCL 5 MG PO TABS: 5.0000 mg | ORAL_TABLET | Freq: Three times a day (TID) | ORAL | 1 refills | Status: DC | PRN

## 2018-07-17 MED ORDER — ATENOLOL 50 MG PO TABS: 50.0000 mg | ORAL_TABLET | Freq: Every day | ORAL | 3 refills | Status: DC

## 2018-07-17 MED ORDER — IOPAMIDOL (ISOVUE-300) INJECTION 61%
100.0000 mL | Freq: Once | INTRAVENOUS | Status: AC | PRN
  Administered 2018-07-17: 100 mL via INTRAVENOUS

## 2018-07-17 MED ORDER — OXYCODONE-ACETAMINOPHEN 5-325 MG PO TABS: 1.0000 | ORAL_TABLET | Freq: Three times a day (TID) | ORAL | 0 refills | Status: DC | PRN

## 2018-07-17 NOTE — Assessment & Plan Note (Signed)
Weighted blanket Valerian root Declined SSRI

## 2018-07-17 NOTE — Telephone Encounter (Signed)
Wells Guiles from [facility] called with CT report: IMPRESSION: 1. No acute abdominal findings, mass lesions or lymphadenopathy. 2. Stable hepatic and renal cysts. 3. Status post hysterectomy. There is a 4 cm septated cystic lesion associated with the right ovary. Recommend ultrasound evaluation and surveillance. The left ovary is normal. 4. Sigmoid colon diverticulosis without findings for acute Diverticulitis. Routing to office high priority
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
## 2018-07-17 NOTE — Patient Instructions (Signed)
Weighted blanket Valerian root

## 2018-07-17 NOTE — Assessment & Plan Note (Signed)
She will re-sch CT

## 2018-07-17 NOTE — Assessment & Plan Note (Signed)
We discussed age appropriate health related issues, including available/recomended screening tests and vaccinations. We discussed a need for adhering to healthy diet and exercise. Labs/EKG were reviewed/ordered. All questions were answered. Colon 2011 GYN q 2 years Derm - q 12 mo

## 2018-07-18 ENCOUNTER — Other Ambulatory Visit: Payer: Self-pay | Admitting: Internal Medicine

## 2018-07-18 DIAGNOSIS — N83201 Unspecified ovarian cyst, right side: Secondary | ICD-10-CM

## 2018-07-18 NOTE — Telephone Encounter (Signed)
I emailed my comments to the patient this morning with results message. Thank you

## 2018-07-22 ENCOUNTER — Ambulatory Visit: Payer: 59 | Admitting: Family Medicine

## 2018-07-28 ENCOUNTER — Ambulatory Visit
Admission: RE | Admit: 2018-07-28 | Discharge: 2018-07-28 | Disposition: A | Discharge status: Home / Self Care | Payer: 59 | Source: Ambulatory Visit | Attending: Internal Medicine | Admitting: Internal Medicine

## 2018-07-28 DIAGNOSIS — N83201 Unspecified ovarian cyst, right side: Secondary | ICD-10-CM

## 2018-07-30 ENCOUNTER — Other Ambulatory Visit: Payer: Self-pay | Admitting: Internal Medicine

## 2018-07-30 DIAGNOSIS — N838 Other noninflammatory disorders of ovary, fallopian tube and broad ligament: Secondary | ICD-10-CM

## 2018-08-05 NOTE — Addendum Note (Signed)
Addended by: Karren Cobble on: 08/05/2018 03:10 PM   Modules accepted: Orders

## 2018-08-07 ENCOUNTER — Telehealth: Payer: Self-pay | Admitting: Internal Medicine

## 2018-08-07 DIAGNOSIS — N838 Other noninflammatory disorders of ovary, fallopian tube and broad ligament: Secondary | ICD-10-CM

## 2018-08-07 DIAGNOSIS — N83201 Unspecified ovarian cyst, right side: Secondary | ICD-10-CM

## 2018-08-07 NOTE — Telephone Encounter (Signed)
Copied from [place] [id number]. Topic: General - Other >> Aug 07, 2018 11:12 AM Yvette Rack wrote: Reason for CRM: Brianna with [facility] stated in addition to the referral patient will also need a pelvic MRI. Cb# [phone]

## 2018-08-08 NOTE — Telephone Encounter (Signed)
Okay. Thank you.

## 2018-08-12 NOTE — Telephone Encounter (Signed)
ordered

## 2018-08-12 NOTE — Addendum Note (Signed)
Addended by: Karren Cobble on: 08/12/2018 10:59 AM   Modules accepted: Orders

## 2018-08-21 ENCOUNTER — Other Ambulatory Visit: Payer: 59

## 2018-08-27 ENCOUNTER — Ambulatory Visit
Admission: RE | Admit: 2018-08-27 | Discharge: 2018-08-27 | Disposition: A | Discharge status: Home / Self Care | Payer: 59 | Source: Ambulatory Visit | Attending: Internal Medicine | Admitting: Internal Medicine

## 2018-08-27 ENCOUNTER — Other Ambulatory Visit: Payer: Self-pay | Admitting: Internal Medicine

## 2018-08-27 DIAGNOSIS — N83201 Unspecified ovarian cyst, right side: Secondary | ICD-10-CM

## 2018-08-27 DIAGNOSIS — N838 Other noninflammatory disorders of ovary, fallopian tube and broad ligament: Secondary | ICD-10-CM

## 2018-08-27 MED ORDER — GADOBENATE DIMEGLUMINE 529 MG/ML IV SOLN
18.0000 mL | Freq: Once | INTRAVENOUS | Status: AC | PRN
  Administered 2018-08-27: 18 mL via INTRAVENOUS

## 2018-09-03 ENCOUNTER — Encounter: Payer: Self-pay | Admitting: Internal Medicine

## 2018-09-03 ENCOUNTER — Other Ambulatory Visit: Payer: Self-pay

## 2018-09-03 ENCOUNTER — Ambulatory Visit (INDEPENDENT_AMBULATORY_CARE_PROVIDER_SITE_OTHER): Payer: 59 | Admitting: Internal Medicine

## 2018-09-03 DIAGNOSIS — E559 Vitamin D deficiency, unspecified: Secondary | ICD-10-CM | POA: Diagnosis not present

## 2018-09-03 DIAGNOSIS — F410 Panic disorder [episodic paroxysmal anxiety] without agoraphobia: Secondary | ICD-10-CM

## 2018-09-03 DIAGNOSIS — R1011 Right upper quadrant pain: Secondary | ICD-10-CM | POA: Diagnosis not present

## 2018-09-03 MED ORDER — OXYCODONE-ACETAMINOPHEN 5-325 MG PO TABS: 1.0000 | ORAL_TABLET | Freq: Three times a day (TID) | ORAL | 0 refills | Status: DC | PRN

## 2018-09-03 MED ORDER — LORAZEPAM 0.5 MG PO TABS: 0.5000 mg | ORAL_TABLET | Freq: Two times a day (BID) | ORAL | 1 refills | Status: DC | PRN

## 2018-09-03 NOTE — Assessment & Plan Note (Signed)
R ovarian cyst Radiology tests reviewed GYN appt pending

## 2018-09-03 NOTE — Assessment & Plan Note (Signed)
Slow improventent

## 2018-09-03 NOTE — Assessment & Plan Note (Signed)
Vit D 

## 2018-09-03 NOTE — Assessment & Plan Note (Signed)
MSK strain
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
## 2018-09-03 NOTE — Assessment & Plan Note (Signed)
Monitoring sx's - discussed

## 2018-09-29 ENCOUNTER — Other Ambulatory Visit: Payer: Self-pay

## 2018-09-29 ENCOUNTER — Encounter (HOSPITAL_BASED_OUTPATIENT_CLINIC_OR_DEPARTMENT_OTHER): Payer: Self-pay | Admitting: *Deleted

## 2018-10-02 NOTE — Progress Notes (Signed)
Called and spoke with pt. She denies any sob, fever, chills, sore throat, head or body ache's, runny nose or sneezing. Denies any travel out of the state in the past 14 days and that anyone else in the home has any of the above symptoms.   She has not yet come for her PAT appt. Pt is on the way to Dr Meisinger's office now so she will not have time to come today. I asked her to be sure to let him know that the labs were not drawn and if we do draw them on the dos they may still be pending since she is a first start.  Reviewed  instructions and arrival time for the day of surgery.
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
## 2018-10-06 ENCOUNTER — Encounter (HOSPITAL_BASED_OUTPATIENT_CLINIC_OR_DEPARTMENT_OTHER)
Admission: RE | Disposition: A | Discharge status: Home / Self Care | Payer: Self-pay | Source: Home / Self Care | Attending: Obstetrics and Gynecology

## 2018-10-06 ENCOUNTER — Ambulatory Visit (HOSPITAL_BASED_OUTPATIENT_CLINIC_OR_DEPARTMENT_OTHER): Payer: 59 | Admitting: Anesthesiology

## 2018-10-06 ENCOUNTER — Ambulatory Visit (HOSPITAL_BASED_OUTPATIENT_CLINIC_OR_DEPARTMENT_OTHER)
Admission: RE | Admit: 2018-10-06 | Discharge: 2018-10-06 | Disposition: A | Discharge status: Home / Self Care | Payer: 59 | Attending: Obstetrics and Gynecology | Admitting: Obstetrics and Gynecology

## 2018-10-06 ENCOUNTER — Encounter (HOSPITAL_BASED_OUTPATIENT_CLINIC_OR_DEPARTMENT_OTHER): Payer: Self-pay

## 2018-10-06 DIAGNOSIS — R1031 Right lower quadrant pain: Secondary | ICD-10-CM | POA: Diagnosis present

## 2018-10-06 DIAGNOSIS — Z888 Allergy status to other drugs, medicaments and biological substances status: Secondary | ICD-10-CM | POA: Diagnosis not present

## 2018-10-06 DIAGNOSIS — Z885 Allergy status to narcotic agent status: Secondary | ICD-10-CM | POA: Insufficient documentation

## 2018-10-06 DIAGNOSIS — Z880 Allergy status to penicillin: Secondary | ICD-10-CM | POA: Diagnosis not present

## 2018-10-06 DIAGNOSIS — D270 Benign neoplasm of right ovary: Secondary | ICD-10-CM | POA: Diagnosis not present

## 2018-10-06 DIAGNOSIS — Z79899 Other long term (current) drug therapy: Secondary | ICD-10-CM | POA: Insufficient documentation

## 2018-10-06 HISTORY — PX: LAPAROSCOPIC BILATERAL SALPINGO OOPHERECTOMY: SHX5890

## 2018-10-06 LAB — CBC
HCT: 38.8 % (ref 36.0–46.0)
Hemoglobin: 12.6 g/dL (ref 12.0–15.0)
MCH: 29 pg (ref 26.0–34.0)
MCHC: 32.5 g/dL (ref 30.0–36.0)
MCV: 89.2 fL (ref 80.0–100.0)
Platelets: 328 10*3/uL (ref 150–400)
RBC: 4.35 MIL/uL (ref 3.87–5.11)
RDW: 12.6 % (ref 11.5–15.5)
WBC: 7.6 10*3/uL (ref 4.0–10.5)
nRBC: 0 % (ref 0.0–0.2)

## 2018-10-06 LAB — BASIC METABOLIC PANEL
Anion gap: 12 (ref 5–15)
BUN: 17 mg/dL (ref 6–20)
CO2: 24 mmol/L (ref 22–32)
Calcium: 9.8 mg/dL (ref 8.9–10.3)
Chloride: 104 mmol/L (ref 98–111)
Creatinine, Ser: 0.48 mg/dL (ref 0.44–1.00)
GFR calc Af Amer: >60 mL/min (ref >60)
GFR calc non Af Amer: >60 mL/min (ref >60)
Glucose, Bld: 104 mg/dL — ABNORMAL HIGH (ref 70–99)
Potassium: 3.7 mmol/L (ref 3.5–5.1)
Sodium: 140 mmol/L (ref 135–145)

## 2018-10-06 SURGERY — SALPINGO-OOPHORECTOMY, BILATERAL, LAPAROSCOPIC
Anesthesia: General | Site: Abdomen | Laterality: Right

## 2018-10-06 MED ORDER — BUPIVACAINE HCL (PF) 0.25 % IJ SOLN
INTRAMUSCULAR | Status: AC
  Filled 2018-10-06: qty 30

## 2018-10-06 MED ORDER — SODIUM CHLORIDE 0.9 % IR SOLN
Status: DC | PRN
  Administered 2018-10-06: 3000 mL

## 2018-10-06 MED ORDER — LACTATED RINGERS IV SOLN
INTRAVENOUS | Status: DC
  Administered 2018-10-06 (×2): via INTRAVENOUS

## 2018-10-06 MED ORDER — SCOPOLAMINE 1 MG/3DAYS TD PT72
MEDICATED_PATCH | TRANSDERMAL | Status: DC | PRN
  Administered 2018-10-06: 1 via TRANSDERMAL

## 2018-10-06 MED ORDER — MIDAZOLAM HCL 2 MG/2ML IJ SOLN
INTRAMUSCULAR | Status: AC
  Filled 2018-10-06: qty 2

## 2018-10-06 MED ORDER — SCOPOLAMINE 1 MG/3DAYS TD PT72: 1.0000 | MEDICATED_PATCH | Freq: Once | TRANSDERMAL | Status: DC | PRN

## 2018-10-06 MED ORDER — DEXAMETHASONE SODIUM PHOSPHATE 10 MG/ML IJ SOLN
INTRAMUSCULAR | Status: AC
  Filled 2018-10-06: qty 1

## 2018-10-06 MED ORDER — KETOROLAC TROMETHAMINE 30 MG/ML IJ SOLN
INTRAMUSCULAR | Status: DC | PRN
  Administered 2018-10-06: 30 mg via INTRAVENOUS

## 2018-10-06 MED ORDER — MIDAZOLAM HCL 2 MG/2ML IJ SOLN: 1.0000 mg | INTRAMUSCULAR | Status: DC | PRN

## 2018-10-06 MED ORDER — MIDAZOLAM HCL 5 MG/5ML IJ SOLN
INTRAMUSCULAR | Status: DC | PRN
  Administered 2018-10-06: 2 mg via INTRAVENOUS

## 2018-10-06 MED ORDER — ROCURONIUM BROMIDE 100 MG/10ML IV SOLN
INTRAVENOUS | Status: DC | PRN
  Administered 2018-10-06: 50 mg via INTRAVENOUS

## 2018-10-06 MED ORDER — LIDOCAINE 2% (20 MG/ML) 5 ML SYRINGE
INTRAMUSCULAR | Status: AC
  Filled 2018-10-06: qty 5

## 2018-10-06 MED ORDER — ONDANSETRON HCL 4 MG/2ML IJ SOLN: 4.0000 mg | Freq: Once | INTRAMUSCULAR | Status: DC | PRN

## 2018-10-06 MED ORDER — SUGAMMADEX SODIUM 200 MG/2ML IV SOLN
INTRAVENOUS | Status: AC
  Filled 2018-10-06: qty 2

## 2018-10-06 MED ORDER — LIDOCAINE HCL (CARDIAC) PF 100 MG/5ML IV SOSY
PREFILLED_SYRINGE | INTRAVENOUS | Status: DC | PRN
  Administered 2018-10-06: 80 mg via INTRAVENOUS

## 2018-10-06 MED ORDER — PROPOFOL 10 MG/ML IV BOLUS
INTRAVENOUS | Status: AC
  Filled 2018-10-06: qty 40

## 2018-10-06 MED ORDER — BUPIVACAINE HCL (PF) 0.25 % IJ SOLN
INTRAMUSCULAR | Status: DC | PRN
  Administered 2018-10-06: 20 mL

## 2018-10-06 MED ORDER — FENTANYL CITRATE (PF) 100 MCG/2ML IJ SOLN
INTRAMUSCULAR | Status: AC
  Filled 2018-10-06: qty 2

## 2018-10-06 MED ORDER — ONDANSETRON HCL 4 MG/2ML IJ SOLN
INTRAMUSCULAR | Status: DC | PRN
  Administered 2018-10-06: 4 mg via INTRAVENOUS

## 2018-10-06 MED ORDER — SCOPOLAMINE 1 MG/3DAYS TD PT72
MEDICATED_PATCH | TRANSDERMAL | Status: AC
  Filled 2018-10-06: qty 1

## 2018-10-06 MED ORDER — FENTANYL CITRATE (PF) 100 MCG/2ML IJ SOLN
INTRAMUSCULAR | Status: DC | PRN
  Administered 2018-10-06: 25 ug via INTRAVENOUS
  Administered 2018-10-06: 50 ug via INTRAVENOUS
  Administered 2018-10-06: 25 ug via INTRAVENOUS
  Administered 2018-10-06 (×2): 50 ug via INTRAVENOUS

## 2018-10-06 MED ORDER — FENTANYL CITRATE (PF) 100 MCG/2ML IJ SOLN: 50.0000 ug | INTRAMUSCULAR | Status: DC | PRN

## 2018-10-06 MED ORDER — LACTATED RINGERS IV SOLN: INTRAVENOUS | Status: DC

## 2018-10-06 MED ORDER — DEXAMETHASONE SODIUM PHOSPHATE 4 MG/ML IJ SOLN
INTRAMUSCULAR | Status: DC | PRN
  Administered 2018-10-06: 10 mg via INTRAVENOUS

## 2018-10-06 MED ORDER — FENTANYL CITRATE (PF) 100 MCG/2ML IJ SOLN
25.0000 ug | INTRAMUSCULAR | Status: DC | PRN
  Administered 2018-10-06 (×2): 50 ug via INTRAVENOUS

## 2018-10-06 MED ORDER — ONDANSETRON HCL 4 MG/2ML IJ SOLN
INTRAMUSCULAR | Status: AC
  Filled 2018-10-06: qty 2

## 2018-10-06 MED ORDER — SUGAMMADEX SODIUM 200 MG/2ML IV SOLN
INTRAVENOUS | Status: DC | PRN
  Administered 2018-10-06: 200 mg via INTRAVENOUS

## 2018-10-06 MED ORDER — PROPOFOL 10 MG/ML IV BOLUS
INTRAVENOUS | Status: DC | PRN
  Administered 2018-10-06: 40 mg via INTRAVENOUS
  Administered 2018-10-06: 160 mg via INTRAVENOUS

## 2018-10-06 MED ORDER — ROCURONIUM BROMIDE 50 MG/5ML IV SOSY
PREFILLED_SYRINGE | INTRAVENOUS | Status: AC
  Filled 2018-10-06: qty 5

## 2018-10-06 SURGICAL SUPPLY — 43 items
ADH SKN CLS APL DERMABOND .7 (GAUZE/BANDAGES/DRESSINGS) ×1
APL PRP STRL LF DISP 70% ISPRP (MISCELLANEOUS) ×1
BAG SPEC RTRVL LRG 6X4 10 (ENDOMECHANICALS) ×1
CABLE HIGH FREQUENCY MONO STRZ (ELECTRODE) ×1 IMPLANT
CATH ROBINSON RED A/P 16FR (CATHETERS) ×2 IMPLANT
CHLORAPREP W/TINT 26 (MISCELLANEOUS) ×2 IMPLANT
CONT SPEC 4OZ CLIKSEAL STRL BL (MISCELLANEOUS) ×1 IMPLANT
COVER WAND RF STERILE (DRAPES) ×1 IMPLANT
DERMABOND ADVANCED (GAUZE/BANDAGES/DRESSINGS) ×1
DERMABOND ADVANCED .7 DNX12 (GAUZE/BANDAGES/DRESSINGS) ×1 IMPLANT
DILATOR CANAL MILEX (MISCELLANEOUS) IMPLANT
DRSG COVADERM PLUS 2X2 (GAUZE/BANDAGES/DRESSINGS) IMPLANT
DRSG OPSITE POSTOP 3X4 (GAUZE/BANDAGES/DRESSINGS) IMPLANT
DURAPREP 26ML APPLICATOR (WOUND CARE) IMPLANT
GAUZE 4X4 16PLY RFD (DISPOSABLE) ×2 IMPLANT
GLOVE BIO SURGEON STRL SZ7 (GLOVE) ×1 IMPLANT
GLOVE BIOGEL PI IND STRL 7.5 (GLOVE) IMPLANT
GLOVE BIOGEL PI IND STRL 8 (GLOVE) ×1 IMPLANT
GLOVE BIOGEL PI INDICATOR 7.5 (GLOVE) ×2
GLOVE BIOGEL PI INDICATOR 8 (GLOVE) ×1
GLOVE ORTHO TXT STRL SZ7.5 (GLOVE) ×2 IMPLANT
GOWN STRL REUS W/ TWL LRG LVL3 (GOWN DISPOSABLE) ×1 IMPLANT
GOWN STRL REUS W/TWL LRG LVL3 (GOWN DISPOSABLE) ×4 IMPLANT
GOWN STRL REUS W/TWL XL LVL3 (GOWN DISPOSABLE) ×2 IMPLANT
HIBICLENS CHG 4% 4OZ BTL (MISCELLANEOUS) IMPLANT
NEEDLE INSUFFLATION 120MM (ENDOMECHANICALS) ×2 IMPLANT
NS IRRIG 1000ML POUR BTL (IV SOLUTION) ×2 IMPLANT
PACK LAPAROSCOPY BASIN (CUSTOM PROCEDURE TRAY) ×2 IMPLANT
PACK TRENDGUARD 450 HYBRID PRO (MISCELLANEOUS) IMPLANT
POUCH SPECIMEN RETRIEVAL 10MM (ENDOMECHANICALS) ×1 IMPLANT
SET BERKELEY SUCTION TUBING (SUCTIONS) ×1 IMPLANT
SET IRRIG TUBING LAPAROSCOPIC (IRRIGATION / IRRIGATOR) ×1 IMPLANT
SHEARS HARMONIC ACE PLUS 36CM (ENDOMECHANICALS) ×1 IMPLANT
SOLUTION ELECTROLUBE (MISCELLANEOUS) IMPLANT
SUT VICRYL 0 UR6 27IN ABS (SUTURE) IMPLANT
SUT VICRYL 4-0 PS2 18IN ABS (SUTURE) ×2 IMPLANT
TOWEL GREEN STERILE FF (TOWEL DISPOSABLE) ×4 IMPLANT
TRAY FOLEY W/BAG SLVR 14FR LF (SET/KITS/TRAYS/PACK) ×1 IMPLANT
TRENDGUARD 450 HYBRID PRO PACK (MISCELLANEOUS) ×2
TROCAR XCEL BLUNT TIP 100MML (ENDOMECHANICALS) IMPLANT
TROCAR XCEL NON-BLD 11X100MML (ENDOMECHANICALS) ×2 IMPLANT
TROCAR XCEL NON-BLD 5MMX100MML (ENDOMECHANICALS) ×1 IMPLANT
WARMER LAPAROSCOPE (MISCELLANEOUS) ×2 IMPLANT

## 2018-10-06 NOTE — Interval H&P Note (Signed)
History and Physical Interval Note:  10/06/2018 7:02 AM  Sheila Lin  has presented today for surgery, with the diagnosis of right adnexal mass non elective.  The various methods of treatment have been discussed with the patient and family. After consideration of risks, benefits and other options for treatment, the patient has consented to  Procedure(s): LAPAROSCOPIC BILATERAL SALPINGO OOPHORECTOMY (Bilateral) as a surgical intervention.  The patient's history has been reviewed, patient examined, no change in status, stable for surgery.  I have reviewed the patient's chart and labs.  Questions were answered to the patient's satisfaction.     Blane Ohara Konrad Hoak

## 2018-10-06 NOTE — Discharge Instructions (Signed)
DISCHARGE INSTRUCTIONS: Laparoscopy  The following instructions have been prepared to help you care for yourself upon your return home today.  Wound care:  Do not get the incision wet for the first 24 hours. The incision should be kept clean and dry.  The Band-Aids or dressings may be removed the day after surgery.  Should the incision become sore, red, and swollen after the first week, check with your doctor.  Personal hygiene:  Shower the day after your procedure.  Activity and limitations:  Do NOT drive or operate any equipment today.  Do NOT lift anything more than 15 pounds for 2-3 weeks after surgery.  Do NOT rest in bed all day.  Walking is encouraged. Walk each day, starting slowly with 5-minute walks 3 or 4 times a day. Slowly increase the length of your walks.  Walk up and down stairs slowly.  Do NOT do strenuous activities, such as golfing, playing tennis, bowling, running, biking, weight lifting, gardening, mowing, or vacuuming for 2-4 weeks. Ask your doctor when it is okay to start.  Diet: Eat a light meal as desired this evening. You may resume your usual diet tomorrow.  Return to work: This is dependent on the type of work you do. For the most part you can return to a desk job within a week of surgery. If you are more active at work, please discuss this with your doctor.  What to expect after your surgery: You may have a slight burning sensation when you urinate on the first day. You may have a very small amount of blood in the urine. Expect to have a small amount of vaginal discharge/light bleeding for 1-2 weeks. It is not unusual to have abdominal soreness and bruising for up to 2 weeks. You may be tired and need more rest for about 1 week. You may experience shoulder pain for 24-72 hours. Lying flat in bed may relieve it.  Call your doctor for any of the following:  Develop a fever of 100.4 or greater  Inability to urinate 6 hours after discharge from  hospital  Severe pain not relieved by pain medications  Persistent of heavy bleeding at incision site  Redness or swelling around incision site after a week  Increasing nausea or vomiting  Patient Signature________________________________________ Nurse Signature_________________________________________ Post Anesthesia Home Care Instructions  Activity: Get plenty of rest for the remainder of the day. A responsible adult should stay with you for 24 hours following the procedure.  For the next 24 hours, DO NOT: -Drive a car -Paediatric nurse -Drink alcoholic beverages -Take any medication unless instructed by your physician -Make any legal decisions or sign important papers.  Meals: Start with liquid foods such as gelatin or soup. Progress to regular foods as tolerated. Avoid greasy, spicy, heavy foods. If nausea and/or vomiting occur, drink only clear liquids until the nausea and/or vomiting subsides. Call your physician if vomiting continues.  Special Instructions/Symptoms: Your throat may feel dry or sore from the anesthesia or the breathing tube placed in your throat during surgery. If this causes discomfort, gargle with warm salt water. The discomfort should disappear within 24 hours.  If you had a scopolamine patch placed behind your ear for the management of post- operative nausea and/or vomiting:  1. The medication in the patch is effective for 72 hours, after which it should be removed.  Wrap patch in a tissue and discard in the trash. Wash hands thoroughly with soap and water. 2. You may remove the patch  earlier than 72 hours if you experience unpleasant side effects which may include dry mouth, dizziness or visual disturbances. 3. Avoid touching the patch. Wash your hands with soap and water after contact with the patch.   Routine instructions for laparoscopy

## 2018-10-06 NOTE — Anesthesia Procedure Notes (Signed)
Procedure Name: Intubation Date/Time: 10/06/2018 7:31 AM Performed by: Justice Rocher, CRNA Pre-anesthesia Checklist: Patient identified, Emergency Drugs available, Suction available and Patient being monitored Patient Re-evaluated:Patient Re-evaluated prior to induction Oxygen Delivery Method: Circle system utilized Preoxygenation: Pre-oxygenation with 100% oxygen Induction Type: IV induction Ventilation: Mask ventilation without difficulty Laryngoscope Size: Mac and 3 Grade View: Grade II Tube type: Oral Tube size: 7.0 mm Number of attempts: 1 Airway Equipment and Method: Stylet and Oral airway Placement Confirmation: ETT inserted through vocal cords under direct vision,  positive ETCO2 and breath sounds checked- equal and bilateral Secured at: 23 cm Tube secured with: Tape Dental Injury: Teeth and Oropharynx as per pre-operative assessment

## 2018-10-06 NOTE — Anesthesia Postprocedure Evaluation (Signed)
Anesthesia Post Note  Patient: Sheila Lin  Procedure(s) Performed: LAPAROSCOPIC  RIGHT  SALPINGO OOPHORECTOMY (Right Abdomen)     Patient location during evaluation: PACU Anesthesia Type: General Level of consciousness: awake and alert Pain management: pain level controlled Vital Signs Assessment: post-procedure vital signs reviewed and stable Respiratory status: spontaneous breathing, nonlabored ventilation, respiratory function stable and patient connected to nasal cannula oxygen Cardiovascular status: blood pressure returned to baseline and stable Postop Assessment: no apparent nausea or vomiting Anesthetic complications: no    Last Vitals:  Vitals:   10/06/18 0949 10/06/18 0950  BP:    Pulse: 69 67  Resp: 13 13  Temp:  36.8 C  SpO2: 97% 97%    Last Pain:  Vitals:   10/06/18 0950  TempSrc: Oral  PainSc:                  Cortina Vultaggio COKER

## 2018-10-06 NOTE — Op Note (Signed)
Obstetrics Op Note 12/03/2014 8:27 AM    Expand All Collapse All   Preoperative diagnosis: Pelvic pain, right adnexal mass Postoperative diagnosis: Same Procedure: Open laparoscopy with right salpingo-oophorectomy Surgeon: Cheri Fowler M.D. Assistant:  Paula Compton, MD Anesthesia: Gen. Endotracheal tube Findings: She had an absent uterus.  Right ovary was enlarged and adherent to surrounding omentum, I was unable to identify left tube and ovary Estimated blood loss: Minimal Specimens: Right tube and ovary, pelvic washings Complications: None  Procedure in detail  The patient was taken to the operating room and placed in the dorsal supine position. General anesthesia was induced. Her left arm was tucked to her side and legs were placed in the mobile stirrups. Abdomen perineum and vagina were then prepped and draped in the usual sterile fashion, bladder drained with a red Robinson catheter. Infraumbilical skin was then infiltrated with quarter percent Marcaine and a 3 cm horizontal incision was made. Fascia was identified and elevated and entered sharply. Peritoneum was then also elevated and entered sharply. The incision was extended bilaterally. A pursestring suture of 0 Vicryl was placed around the fascia and the Hassan cannula was inserted and secured. Abdomen was insufflated with CO2 and good visualization was achieved. 5 mm ports were placed on each side under direct visualization. Inspection revealed the above-mentioned findings. Harmonic scalpel was used to take down the right IP ligament and all adhesions, keeping the ovary intact.  It was then placed in the posterior culdesac.  There were some adhesions of the descending colon to the left pelvic sidewall.  I used harmonic scalpel and dissection to try to remove some of these adhesions  to identify the left tube and ovary.  I was never able to definitively identify the left tube and ovary.  The 5 mm scope was then placed through the  port on the right and an Endo Catch bag was placed through the umbilical port. The ovary was scooped into the bag and brought to the umbilical incision and removed easily.  The previously placed pursestring suture was tied and good fascial closure was achieved.  Inspection of the pelvis then again revealed adequate hemostasis and I was still unable to identify the left tube and ovary.   5 mm ports were removed after all gas was allowed to deflate from the abdomen. All skin incisions were closed with subcutaneous sutures of 4-0 Vicryl followed by Dermabond. The patient was awakened and  taken to the recovery room in stable condition after tolerating the procedure well. Counts were correct and she had PAS hose on throughout the procedure.

## 2018-10-06 NOTE — Anesthesia Preprocedure Evaluation (Signed)
Anesthesia Evaluation  Patient identified by MRN, date of birth, ID band Patient awake    Reviewed: Allergy & Precautions, NPO status , Patient's Chart, lab work & pertinent test results  Airway Mallampati: II  TM Distance: >3 FB Neck ROM: Full    Dental  (+) Teeth Intact, Dental Advisory Given   Pulmonary    breath sounds clear to auscultation       Cardiovascular  Rhythm:Regular Rate:Normal     Neuro/Psych    GI/Hepatic   Endo/Other    Renal/GU      Musculoskeletal   Abdominal   Peds  Hematology   Anesthesia Other Findings   Reproductive/Obstetrics                             Anesthesia Physical Anesthesia Plan  ASA: II  Anesthesia Plan: General   Post-op Pain Management:    Induction: Intravenous  PONV Risk Score and Plan: 1 and Ondansetron and Dexamethasone  Airway Management Planned: Oral ETT  Additional Equipment:   Intra-op Plan:   Post-operative Plan: Extubation in OR  Informed Consent: I have reviewed the patients History and Physical, chart, labs and discussed the procedure including the risks, benefits and alternatives for the proposed anesthesia with the patient or authorized representative who has indicated his/her understanding and acceptance.     Dental advisory given  Plan Discussed with: CRNA and Anesthesiologist  Anesthesia Plan Comments:         Anesthesia Quick Evaluation

## 2018-10-06 NOTE — Transfer of Care (Signed)
Immediate Anesthesia Transfer of Care Note  Patient: Sheila Lin  Procedure(s) Performed: Procedure(s) (LRB): LAPAROSCOPIC  RIGHT  SALPINGO OOPHORECTOMY (Right)  Patient Location: PACU  Anesthesia Type: General  Level of Consciousness: awake, sedated, patient cooperative and responds to stimulation  Airway & Oxygen Therapy: Patient Spontanous Breathing and Patient connected to face mask oxygen w/ Cloth mask over COVID protocol   Post-op Assessment: Report given to PACU RN, Post -op Vital signs reviewed and stable and Patient moving all extremities  Post vital signs: Reviewed and stable  Complications: No apparent anesthesia complications

## 2018-10-07 ENCOUNTER — Encounter (HOSPITAL_BASED_OUTPATIENT_CLINIC_OR_DEPARTMENT_OTHER): Payer: Self-pay | Admitting: Obstetrics and Gynecology

## 2018-10-15 ENCOUNTER — Ambulatory Visit: Payer: 59 | Admitting: Internal Medicine

## 2018-11-05 DIAGNOSIS — Z0279 Encounter for issue of other medical certificate: Secondary | ICD-10-CM

## 2019-03-30 ENCOUNTER — Ambulatory Visit (INDEPENDENT_AMBULATORY_CARE_PROVIDER_SITE_OTHER): Payer: 59 | Admitting: Internal Medicine

## 2019-03-30 ENCOUNTER — Other Ambulatory Visit: Payer: Self-pay

## 2019-03-30 ENCOUNTER — Encounter: Payer: Self-pay | Admitting: Internal Medicine

## 2019-03-30 VITALS — BP 142/86 | HR 77 | Temp 99.0°F | Ht 68.0 in | Wt 180.0 lb

## 2019-03-30 DIAGNOSIS — M545 Low back pain: Secondary | ICD-10-CM

## 2019-03-30 DIAGNOSIS — G8929 Other chronic pain: Secondary | ICD-10-CM

## 2019-03-30 DIAGNOSIS — Z23 Encounter for immunization: Secondary | ICD-10-CM

## 2019-03-30 DIAGNOSIS — M542 Cervicalgia: Secondary | ICD-10-CM | POA: Diagnosis not present

## 2019-03-30 MED ORDER — OXYCODONE-ACETAMINOPHEN 5-325 MG PO TABS: 0.5000 | ORAL_TABLET | Freq: Three times a day (TID) | ORAL | 0 refills | Status: DC | PRN

## 2019-03-30 NOTE — Assessment & Plan Note (Signed)
better 

## 2019-03-30 NOTE — Progress Notes (Signed)
Subjective:  Patient ID: Sheila Lin, female    DOB: 04-17-1961  Age: 58 y.o. MRN: [id number]  CC: No chief complaint on file.   HPI Sheila Lin presents for severe LBP, cerv pain - not better C/o a lot of stress - parents are old, mother has sarcoma - s/p surgery;the pt has been lifting her...  Outpatient Medications Prior to Visit  Medication Sig Dispense Refill  . atenolol (TENORMIN) 50 MG tablet Take 1 tablet (50 mg total) by mouth daily. 90 tablet 3  . CALCIUM PO Take 800 mg by mouth daily.     . Cholecalciferol (VITAMIN D3) 2000 units capsule Take 1 capsule (2,000 Units total) by mouth daily. 100 capsule 3  . LORazepam (ATIVAN) 0.5 MG tablet Take 1 tablet (0.5 mg total) by mouth 2 (two) times daily as needed for anxiety or sleep. 30 tablet 1  . oxyCODONE-acetaminophen (PERCOCET/ROXICET) 5-325 MG tablet Take 1 tablet by mouth every 8 (eight) hours as needed for severe pain. 30 tablet 0   No facility-administered medications prior to visit.     ROS: Review of Systems  Constitutional: Negative for activity change, appetite change, chills, fatigue and unexpected weight change.  HENT: Negative for congestion, mouth sores and sinus pressure.   Eyes: Negative for visual disturbance.  Respiratory: Negative for cough and chest tightness.   Gastrointestinal: Negative for abdominal pain and nausea.  Genitourinary: Negative for difficulty urinating, frequency and vaginal pain.  Musculoskeletal: Positive for back pain, neck pain and neck stiffness. Negative for gait problem.  Skin: Negative for pallor and rash.  Neurological: Negative for dizziness, tremors, weakness, numbness and headaches.  Psychiatric/Behavioral: Negative for confusion and sleep disturbance. The patient is nervous/anxious.     Objective:  BP (!) 142/86 (BP Location: Left Arm, Patient Position: Sitting, Cuff Size: Normal)   Pulse 77   Temp 99 F (37.2 C) (Oral)   Ht 5\' 8"  (1.727 m)   Wt 180 lb  (81.6 kg)   SpO2 98%   BMI 27.37 kg/m   BP Readings from Last 3 Encounters:  03/30/19 (!) 142/86  10/06/18 (!) 114/56  09/03/18 132/78    Wt Readings from Last 3 Encounters:  03/30/19 180 lb (81.6 kg)  10/06/18 182 lb 5.1 oz (82.7 kg)  09/03/18 184 lb (83.5 kg)    Physical Exam Constitutional:      General: She is not in acute distress.    Appearance: She is well-developed.  HENT:     Head: Normocephalic.     Right Ear: External ear normal.     Left Ear: External ear normal.     Nose: Nose normal.  Eyes:     General:        Right eye: No discharge.        Left eye: No discharge.     Conjunctiva/sclera: Conjunctivae normal.     Pupils: Pupils are equal, round, and reactive to light.  Neck:     Musculoskeletal: Normal range of motion and neck supple.     Thyroid: No thyromegaly.     Vascular: No JVD.     Trachea: No tracheal deviation.  Cardiovascular:     Rate and Rhythm: Normal rate and regular rhythm.     Heart sounds: Normal heart sounds.  Pulmonary:     Effort: No respiratory distress.     Breath sounds: No stridor. No wheezing.  Abdominal:     General: Bowel sounds are normal. There is no distension.  Palpations: Abdomen is soft. There is no mass.     Tenderness: There is no abdominal tenderness. There is no guarding or rebound.  Musculoskeletal:        General: Tenderness present.  Lymphadenopathy:     Cervical: No cervical adenopathy.  Skin:    Findings: No erythema or rash.  Neurological:     Mental Status: She is oriented to person, place, and time.     Cranial Nerves: No cranial nerve deficit.     Motor: No abnormal muscle tone.     Coordination: Coordination normal.     Deep Tendon Reflexes: Reflexes normal.  Psychiatric:        Behavior: Behavior normal.        Thought Content: Thought content normal.        Judgment: Judgment normal.    Painful LS spine, neck   Lab Results  Component Value Date   WBC 7.6 10/06/2018   HGB 12.6  10/06/2018   HCT 38.8 10/06/2018   PLT 328 10/06/2018   GLUCOSE 104 (H) 10/06/2018   CHOL 183 07/17/2018   TRIG 81.0 07/17/2018   HDL 53.10 07/17/2018   LDLDIRECT 156.2 03/15/2010   LDLCALC 114 (H) 07/17/2018   ALT 18 07/17/2018   AST 13 07/17/2018   NA 140 10/06/2018   K 3.7 10/06/2018   CL 104 10/06/2018   CREATININE 0.48 10/06/2018   BUN 17 10/06/2018   CO2 24 10/06/2018   TSH 0.42 07/17/2018    No results found.  Assessment & Plan:   There are no diagnoses linked to this encounter.   No orders of the defined types were placed in this encounter.    Follow-up: No follow-ups on file.  Walker Kehr, MD
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
## 2019-03-31 NOTE — Addendum Note (Signed)
Addended by: Karren Cobble on: 03/31/2019 09:31 AM   Modules accepted: Orders

## 2019-04-03 ENCOUNTER — Telehealth: Payer: Self-pay

## 2019-04-03 NOTE — Telephone Encounter (Signed)
Key: [id number]

## 2019-04-03 NOTE — Telephone Encounter (Signed)
Approvedtoday Request Reference Number: [id number]. OXYCOD/APAP TAB 5-325MG  is approved through 05/04/2019. For further questions, call [phone].

## 2019-04-03 NOTE — Telephone Encounter (Addendum)
Catrina PA dept with [employer] needs more clinical info concerning hydrocodone-ace 5-325. Please call

## 2019-07-20 ENCOUNTER — Other Ambulatory Visit: Payer: Self-pay

## 2019-07-20 ENCOUNTER — Encounter: Payer: Self-pay | Admitting: Internal Medicine

## 2019-07-20 ENCOUNTER — Ambulatory Visit (INDEPENDENT_AMBULATORY_CARE_PROVIDER_SITE_OTHER): Payer: 59 | Admitting: Internal Medicine

## 2019-07-20 VITALS — BP 142/88 | HR 70 | Temp 98.7°F | Ht 68.0 in | Wt 185.0 lb

## 2019-07-20 DIAGNOSIS — M546 Pain in thoracic spine: Secondary | ICD-10-CM | POA: Diagnosis not present

## 2019-07-20 DIAGNOSIS — F410 Panic disorder [episodic paroxysmal anxiety] without agoraphobia: Secondary | ICD-10-CM | POA: Diagnosis not present

## 2019-07-20 DIAGNOSIS — Z0000 Encounter for general adult medical examination without abnormal findings: Secondary | ICD-10-CM

## 2019-07-20 DIAGNOSIS — Z23 Encounter for immunization: Secondary | ICD-10-CM | POA: Diagnosis not present

## 2019-07-20 DIAGNOSIS — E559 Vitamin D deficiency, unspecified: Secondary | ICD-10-CM | POA: Diagnosis not present

## 2019-07-20 DIAGNOSIS — G8929 Other chronic pain: Secondary | ICD-10-CM

## 2019-07-20 MED ORDER — TRIAMCINOLONE ACETONIDE 0.1 % EX CREA: 1.0000 "application " | TOPICAL_CREAM | Freq: Two times a day (BID) | CUTANEOUS | 3 refills | Status: DC

## 2019-07-20 MED ORDER — OXYCODONE-ACETAMINOPHEN 5-325 MG PO TABS: 0.5000 | ORAL_TABLET | Freq: Three times a day (TID) | ORAL | 0 refills | Status: DC | PRN

## 2019-07-20 MED ORDER — ATENOLOL 50 MG PO TABS: 50.0000 mg | ORAL_TABLET | Freq: Every day | ORAL | 3 refills | Status: DC

## 2019-07-20 MED ORDER — LORAZEPAM 0.5 MG PO TABS: 0.5000 mg | ORAL_TABLET | Freq: Two times a day (BID) | ORAL | 1 refills | Status: DC | PRN

## 2019-07-20 NOTE — Assessment & Plan Note (Signed)
On Vit D 

## 2019-07-20 NOTE — Assessment & Plan Note (Signed)
We discussed age appropriate health related issues, including available/recomended screening tests and vaccinations. We discussed a need for adhering to healthy diet and exercise. Labs/EKG were reviewed/ordered. All questions were answered. Colon 2011 GYN q 2 years Derm - q 12 mo

## 2019-07-20 NOTE — Assessment & Plan Note (Addendum)
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

## 2019-07-20 NOTE — Assessment & Plan Note (Signed)
Declined SSRI

## 2019-07-20 NOTE — Progress Notes (Signed)
Subjective:  Patient ID: Sheila Lin, female    DOB: [birth year]  Age: 59 y.o. MRN: [id number]  CC: No chief complaint on file.   HPI Sheila Lin presents for well exam F/u stress.  Has been taking care of of her dying mother.  It has been very taxing physically and emotionally. C/o severe pain neck, LS spine  Outpatient Medications Prior to Visit  Medication Sig Dispense Refill  . atenolol (TENORMIN) 50 MG tablet Take 1 tablet (50 mg total) by mouth daily. 90 tablet 3  . CALCIUM PO Take 800 mg by mouth daily.     . Cholecalciferol (VITAMIN D3) 2000 units capsule Take 1 capsule (2,000 Units total) by mouth daily. 100 capsule 3  . LORazepam (ATIVAN) 0.5 MG tablet Take 1 tablet (0.5 mg total) by mouth 2 (two) times daily as needed for anxiety or sleep. 30 tablet 1  . oxyCODONE-acetaminophen (PERCOCET/ROXICET) 5-325 MG tablet Take 0.5-1 tablets by mouth every 8 (eight) hours as needed for severe pain. 60 tablet 0   No facility-administered medications prior to visit.    ROS: Review of Systems  Constitutional: Positive for fatigue. Negative for activity change, appetite change, chills and unexpected weight change.  HENT: Negative for congestion, mouth sores and sinus pressure.   Eyes: Negative for visual disturbance.  Respiratory: Negative for cough and chest tightness.   Gastrointestinal: Negative for abdominal pain and nausea.  Genitourinary: Negative for difficulty urinating, frequency and vaginal pain.  Musculoskeletal: Positive for back pain and neck pain. Negative for gait problem.  Skin: Negative for pallor and rash.  Neurological: Negative for dizziness, tremors, weakness, numbness and headaches.  Psychiatric/Behavioral: Positive for dysphoric mood and sleep disturbance. Negative for confusion and suicidal ideas. The patient is nervous/anxious.     Objective:  There were no vitals taken for this visit.  BP Readings from Last 3 Encounters:  03/30/19 (!)  142/86  10/06/18 (!) 114/56  09/03/18 132/78    Wt Readings from Last 3 Encounters:  03/30/19 180 lb (81.6 kg)  10/06/18 182 lb 5.1 oz (82.7 kg)  09/03/18 184 lb (83.5 kg)    Physical Exam Constitutional:      General: She is not in acute distress.    Appearance: She is well-developed.  HENT:     Head: Normocephalic.     Right Ear: External ear normal.     Left Ear: External ear normal.     Nose: Nose normal.  Eyes:     General:        Right eye: No discharge.        Left eye: No discharge.     Conjunctiva/sclera: Conjunctivae normal.     Pupils: Pupils are equal, round, and reactive to light.  Neck:     Thyroid: No thyromegaly.     Vascular: No JVD.     Trachea: No tracheal deviation.  Cardiovascular:     Rate and Rhythm: Normal rate and regular rhythm.     Heart sounds: Normal heart sounds.  Pulmonary:     Effort: No respiratory distress.     Breath sounds: No stridor. No wheezing.  Abdominal:     General: Bowel sounds are normal. There is no distension.     Palpations: Abdomen is soft. There is no mass.     Tenderness: There is no abdominal tenderness. There is no guarding or rebound.  Musculoskeletal:        General: Tenderness present.     Cervical back:  Normal range of motion and neck supple.  Lymphadenopathy:     Cervical: No cervical adenopathy.  Skin:    Findings: No erythema or rash.  Neurological:     Cranial Nerves: No cranial nerve deficit.     Motor: No abnormal muscle tone.     Coordination: Coordination normal.     Deep Tendon Reflexes: Reflexes normal.  Psychiatric:        Behavior: Behavior normal.        Thought Content: Thought content normal.        Judgment: Judgment normal.   Cervical and lumbar spine are tender She appears sad  Lab Results  Component Value Date   WBC 7.6 10/06/2018   HGB 12.6 10/06/2018   HCT 38.8 10/06/2018   PLT 328 10/06/2018   GLUCOSE 104 (H) 10/06/2018   CHOL 183 07/17/2018   TRIG 81.0 07/17/2018   HDL  53.10 07/17/2018   LDLDIRECT 156.2 03/15/2010   LDLCALC 114 (H) 07/17/2018   ALT 18 07/17/2018   AST 13 07/17/2018   NA 140 10/06/2018   K 3.7 10/06/2018   CL 104 10/06/2018   CREATININE 0.48 10/06/2018   BUN 17 10/06/2018   CO2 24 10/06/2018   TSH 0.42 07/17/2018    No results found.  Assessment & Plan:    Walker Kehr, MD

## 2019-07-21 ENCOUNTER — Encounter: Payer: Self-pay | Admitting: Internal Medicine
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
## 2019-08-14 ENCOUNTER — Other Ambulatory Visit: Payer: Self-pay | Admitting: Internal Medicine

## 2019-08-14 DIAGNOSIS — N6310 Unspecified lump in the right breast, unspecified quadrant: Secondary | ICD-10-CM

## 2019-08-18 ENCOUNTER — Other Ambulatory Visit: Payer: 59

## 2019-08-19 ENCOUNTER — Telehealth: Payer: Self-pay | Admitting: Internal Medicine

## 2019-08-19 ENCOUNTER — Other Ambulatory Visit: Payer: Self-pay | Admitting: Internal Medicine

## 2019-08-19 NOTE — Telephone Encounter (Signed)
Medication Requested: oxyCODONE-acetaminophen (PERCOCET/ROXICET) 5-325 MG tablet  Is medication on med list: Yes (if no, inform pt they may need an appointment)  Is medication a controled: Yes (yes = last OV with PCP)  -Controlled Substances: Adderall, Ritalin, oxycodone, hydrocodone, methadone, alprazolam, etc  Last visit with PCP: 07/20/19  Is the OV > than 4 months: NO (yes = schedule an appt if one is not already made)  Pharmacy (Name, [facility]): [facility] [place]

## 2019-08-20 NOTE — Telephone Encounter (Signed)
Check [place] registry last filled 07/20/2019. PCP is out of the office this week. Pls advise on refill.Marland KitchenJohny Chess

## 2019-08-20 NOTE — Telephone Encounter (Signed)
Check [place] registry last filled 07/20/2019. MD is out of the office this week. Pls advise on refill.Marland KitchenJohny Lin

## 2019-08-20 NOTE — Telephone Encounter (Signed)
Addressed already via refill encounter. Refer to that.

## 2019-08-20 NOTE — Telephone Encounter (Signed)
It would appear this has only been prescribed by PCP twice and prior prescription lasted about 3 months. Given that this refill request is much sooner should wait for PCP.

## 2019-10-22 DIAGNOSIS — Z0279 Encounter for issue of other medical certificate: Secondary | ICD-10-CM

## 2019-12-02 ENCOUNTER — Telehealth: Payer: Self-pay | Admitting: Internal Medicine

## 2019-12-02 NOTE — Telephone Encounter (Signed)
Error
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
## 2020-04-23 IMAGING — MR MR PELVIS WO/W CM
16 of 24 series · 27 of 48 positions shown · IV contrast (multihance)
Comparison: None.

07/17/2018

CLINICAL DATA: Right ovary mass suspected.  Evaluate for neoplasm.

EXAM:
MRI ABDOMEN AND PELVIS WITHOUT AND WITH CONTRAST
TECHNIQUE: Multiplanar multisequence MR imaging of the abdomen and pelvis was
performed both before and after the administration of intravenous
contrast.
CONTRAST:  18mL MULTIHANCE GADOBENATE DIMEGLUMINE 529 MG/ML IV SOLN

[Series 2: cor haste · coronal · 5.0mm · 0.78mm/px · 1 of 38 slices shown]
[im 1/38]
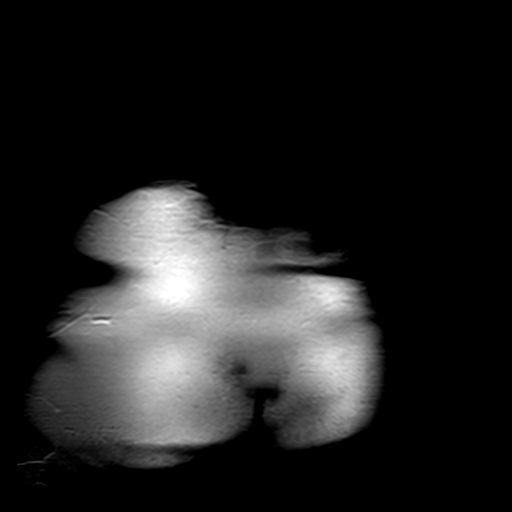

[Series 3: cor haste abd · coronal · 5.0mm · 0.78mm/px · 1 of 38 slices shown]
[im 1/38]
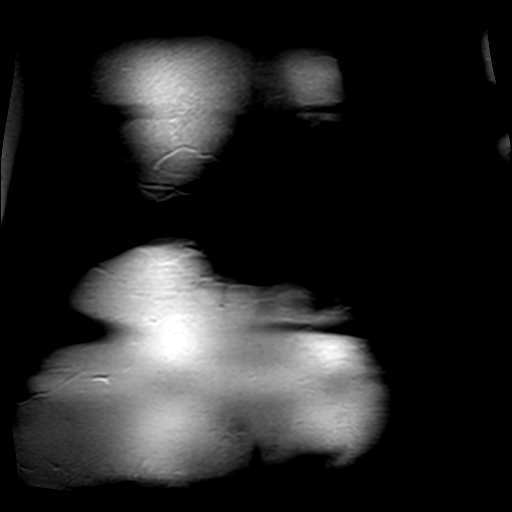

[Series 4: axial haste · axial · 6.0mm · 0.78mm/px · 1 of 35 slices shown]
[im 1/35]
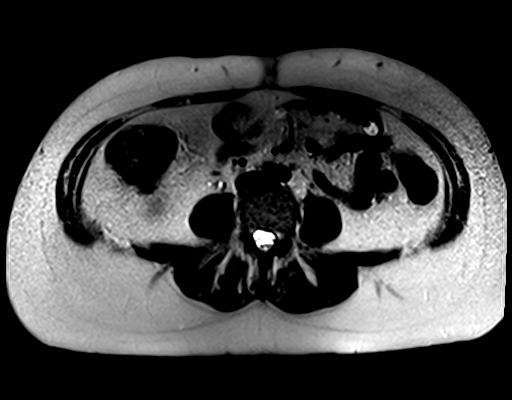

[Series 5: T1 · axial · 6.0mm · 0.78mm/px · z∈[+123,+347]mm · 2 of 70 slices shown]
[im 1/70]
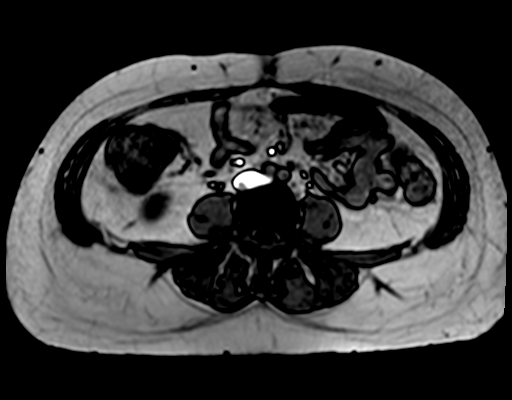
[im 70/70]
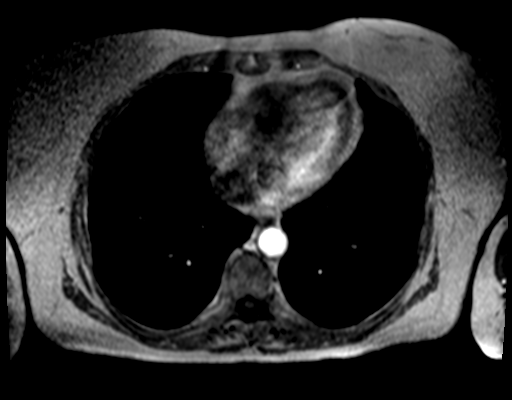

[Series 6: bSSFP · axial · 4.0mm · 0.78mm/px · z∈[+112,+344]mm · 2 of 59 slices shown]
[im 1/59]
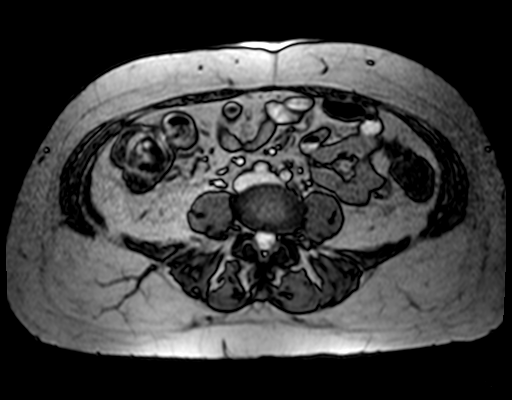
[im 59/59]
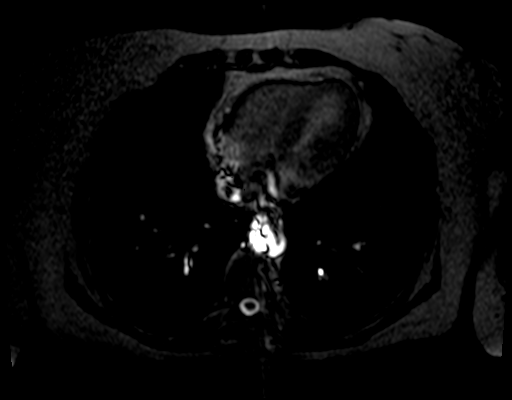

[Series 7: T2 · axial · 6.0mm · 1.25mm/px · 1 of 33 slices shown]
[im 1/33]
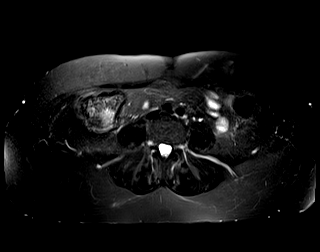

[Series 8: ep2d_diff_b50_500_800_p2_trig · axial · 6.0mm · 2.08mm/px · z∈[+120,+350]mm · 3 of 99 slices shown]
[im 1/99]
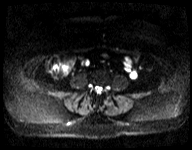
[im 50/99]
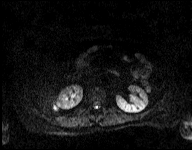
[im 99/99]
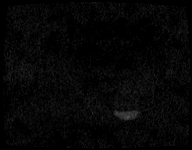

[Series 9: ep2d_[id number]_800_p2_trig_adc · axial · 6.0mm · 2.08mm/px · 1 of 33 slices shown]
[im 1/33]
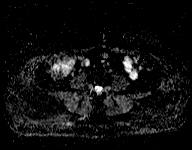

[Series 10: T1 dynamic · axial · non-contrast · 2.5mm · 0.78mm/px · z∈[+116,+354]mm · 3 of 96 slices shown]
[im 1/96]
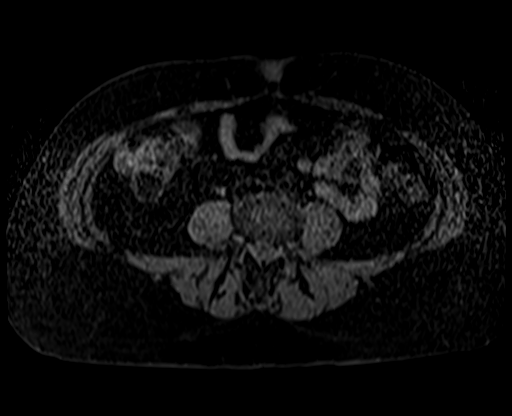
[im 48/96]
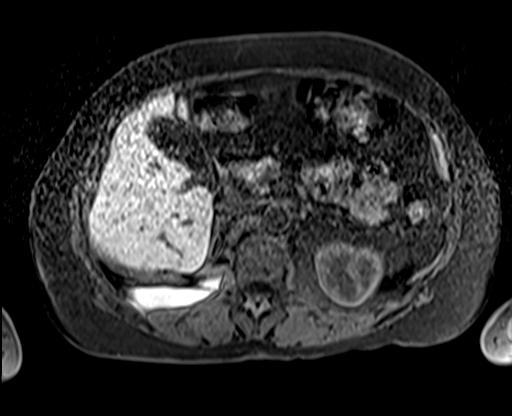
[im 96/96]
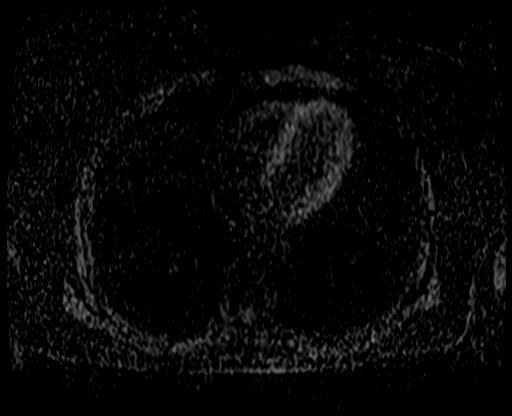

[Series 11: t2_tse_sag · sagittal · 5.0mm · 1.02mm/px · 1 of 29 slices shown]
[im 1/29]
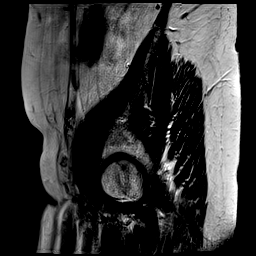

[Series 12: t2_tse axial · axial · 6.0mm · 0.98mm/px · 1 of 26 slices shown]
[im 1/26]
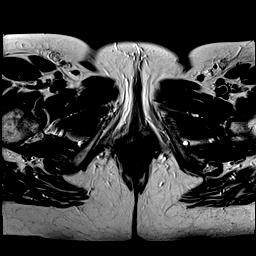

[Series 13: axial spgr · axial · 6.0mm · 0.98mm/px · 1 of 26 slices shown]
[im 1/26]
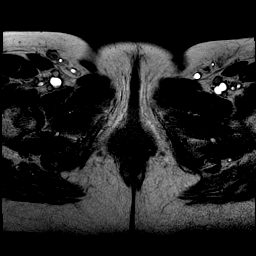

[Series 14: axial spgr pre · axial · non-contrast · 6.0mm · 0.49mm/px · 1 of 26 slices shown]
[im 1/26]
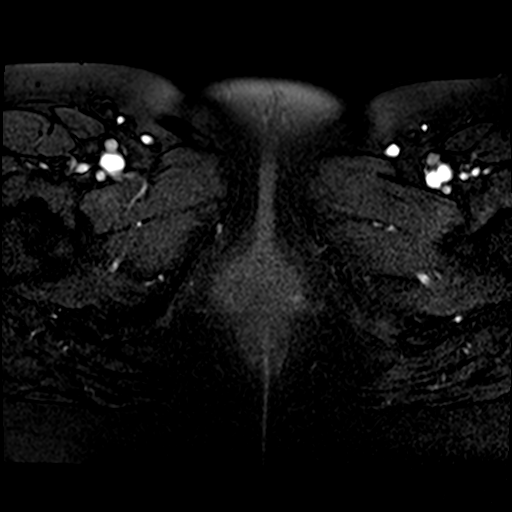

[Series 15: T1 dynamic post-contrast · axial · 2.5mm · 0.78mm/px · z∈[+116,+354]mm · 3 of 96 slices shown (1 of 3)]
[im 1/96]
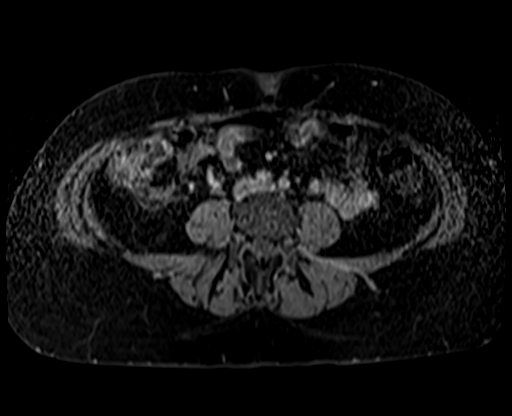
[im 48/96]
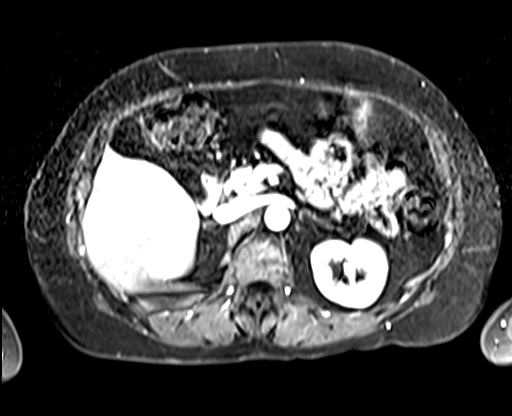
[im 96/96]
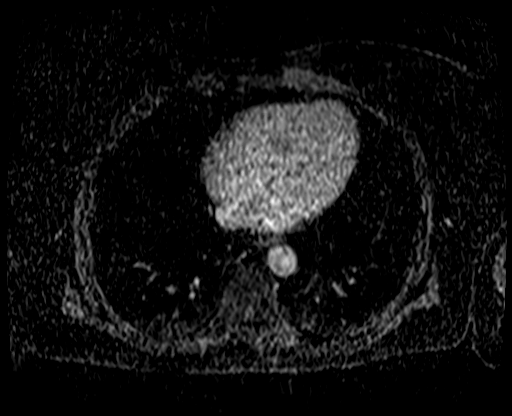

[Series 16: T1 dynamic post-contrast · axial · 2.5mm · 0.78mm/px · z∈[+116,+354]mm · 3 of 96 slices shown (2 of 3)]
[im 1/96]
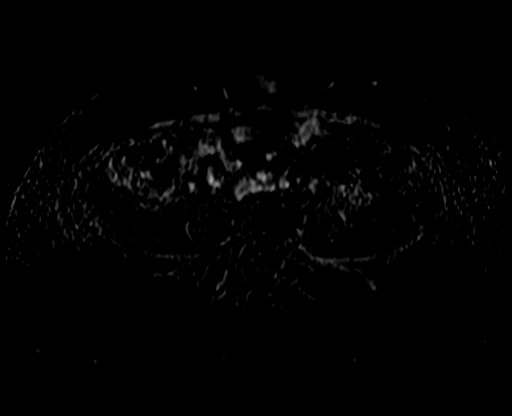
[im 48/96]
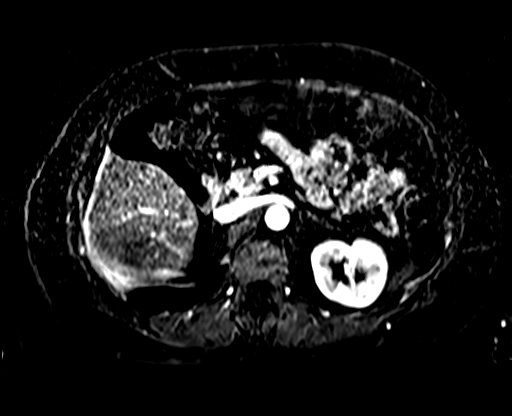
[im 96/96]
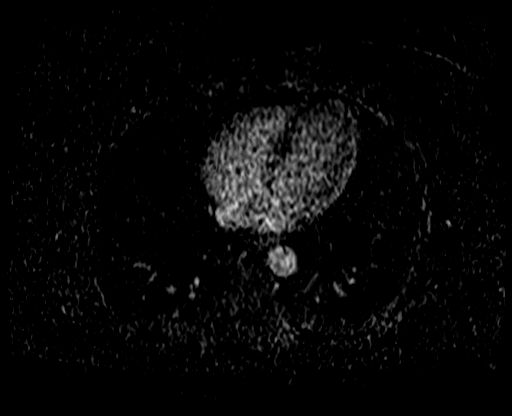

[Series 18: T1 dynamic post-contrast · axial · 2.5mm · 0.78mm/px · z∈[+116,+234]mm · 2 of 96 slices shown (3 of 3)]
[im 1/96]
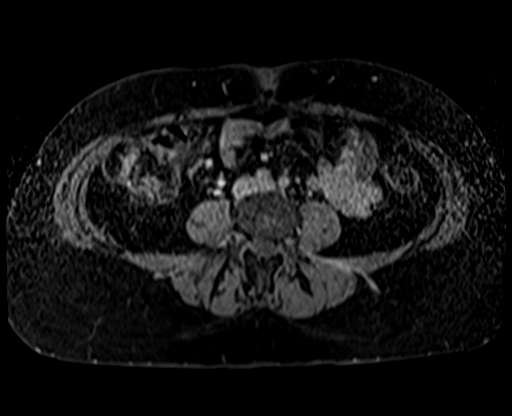
[im 48/96]
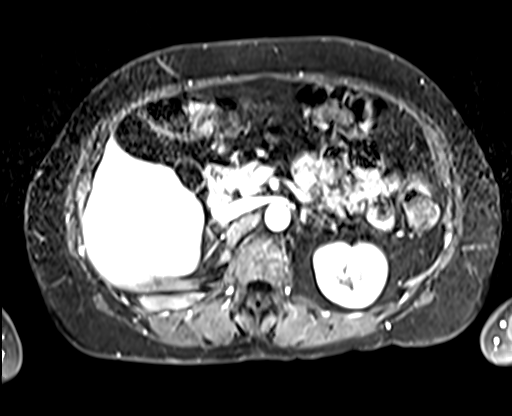

[27 of 48 positions shown; findings below may reference images not displayed]

FINDINGS: COMBINED FINDINGS FOR BOTH MR ABDOMEN AND PELVIS

Lower chest: No acute findings.

Hepatobiliary: Cyst within left lobe of liver measures 3.2 cm.
Within segment 6 there is a cyst measuring 2.0 cm. The 2 additional,
nonenhancing T2 hyperintense structures are noted in the left lobe
of liver measuring up to 8 mm, also likely small cysts. No
suspicious enhancing liver abnormality. Unremarkable appearance of
the gallbladder. No biliary dilatation

Pancreas: Normal appearance the pancreas. No main duct dilatation,
inflammation or mass.

Spleen: Within normal limits in size. There are 3 tiny subcapsular
T2 hyperintense foci along the dome of the spleen which measure up
to 5 mm, image [DATE]. These are too small to characterize but likely
represent small cysts.

Adrenals/Urinary Tract: Normal appearance of the adrenal glands.
Several kidney cysts noted. The largest arises from inferior pole of
the left kidney measuring 1 cm. No hydronephrosis or soft enhancing
kidney lesion identified. No hydronephrosis identified bilaterally.
Urinary bladder is within normal limits.

Stomach/Bowel: The stomach is unremarkable. No dilated loops of
small or large bowel. Distal colonic diverticulosis identified
without acute inflammation.

Vascular/Lymphatic: Normal appearance of the abdominal aorta. The
portal vein and hepatic veins are patent. No retroperitoneal
adenopathy. No mesenteric adenopathy. No suspicious pelvic or
inguinal adenopathy.

Reproductive: Status post hysterectomy. The left ovary is identified
and has a normal appearance measuring 1.8 by 1.0 by 1.8 cm. Cystic
lesion within the right adnexa is again noted. This measures 3.5 by
3.5 by 3.2 cm. Along the medial margin of this lesion there are
multiple thin internal areas of septation, image [DATE]. On the
subtraction images there is mild enhancement associated these areas
of septation. No enhancing mural nodule identified.

Other: There is no ascites identified. No peritoneal nodularity or
mass.

Musculoskeletal: No suspicious bone lesions identified.
IMPRESSION: 1. Again seen is a cystic lesion within the right adnexa containing
multiple internal areas of thin septation. Septations exhibit mild
enhancement. No definite enhancing mural nodule identified. This
remains indeterminate. The presence of multiple septations suggest
the presence of neoplasm but since these are thin, a benign neoplasm
is favored. Consider further workup with surgical evaluation.
2. Multiple liver, kidney and spleen cysts.

## 2020-05-31 DIAGNOSIS — D225 Melanocytic nevi of trunk: Secondary | ICD-10-CM | POA: Diagnosis not present

## 2020-05-31 DIAGNOSIS — L814 Other melanin hyperpigmentation: Secondary | ICD-10-CM | POA: Diagnosis not present

## 2020-05-31 DIAGNOSIS — Z86018 Personal history of other benign neoplasm: Secondary | ICD-10-CM | POA: Diagnosis not present

## 2020-05-31 DIAGNOSIS — Z85828 Personal history of other malignant neoplasm of skin: Secondary | ICD-10-CM | POA: Diagnosis not present

## 2020-07-19 ENCOUNTER — Other Ambulatory Visit: Payer: Self-pay

## 2020-07-20 ENCOUNTER — Ambulatory Visit (INDEPENDENT_AMBULATORY_CARE_PROVIDER_SITE_OTHER): Payer: BC Managed Care – PPO | Admitting: Internal Medicine

## 2020-07-20 ENCOUNTER — Encounter: Payer: Self-pay | Admitting: Internal Medicine

## 2020-07-20 VITALS — BP 132/80 | HR 99 | Temp 99.5°F | Ht 68.0 in | Wt 180.2 lb

## 2020-07-20 DIAGNOSIS — G8929 Other chronic pain: Secondary | ICD-10-CM

## 2020-07-20 DIAGNOSIS — Z0000 Encounter for general adult medical examination without abnormal findings: Secondary | ICD-10-CM | POA: Diagnosis not present

## 2020-07-20 DIAGNOSIS — M545 Low back pain, unspecified: Secondary | ICD-10-CM | POA: Diagnosis not present

## 2020-07-20 DIAGNOSIS — F410 Panic disorder [episodic paroxysmal anxiety] without agoraphobia: Secondary | ICD-10-CM

## 2020-07-20 DIAGNOSIS — Z23 Encounter for immunization: Secondary | ICD-10-CM | POA: Diagnosis not present

## 2020-07-20 DIAGNOSIS — E559 Vitamin D deficiency, unspecified: Secondary | ICD-10-CM | POA: Diagnosis not present

## 2020-07-20 DIAGNOSIS — Z800 Family history of malignant neoplasm of digestive organs: Secondary | ICD-10-CM | POA: Diagnosis not present

## 2020-07-20 LAB — COMPREHENSIVE METABOLIC PANEL
ALT: 16 U/L (ref 0–35)
AST: 18 U/L (ref 0–37)
Albumin: 4.6 g/dL (ref 3.5–5.2)
Alkaline Phosphatase: 73 U/L (ref 39–117)
BUN: 18 mg/dL (ref 6–23)
CO2: 28 mEq/L (ref 19–32)
Calcium: 10.2 mg/dL (ref 8.4–10.5)
Chloride: 105 mEq/L (ref 96–112)
Creatinine, Ser: 0.57 mg/dL (ref 0.40–1.20)
GFR: 98.34 mL/min (ref >60.00)
Glucose, Bld: 115 mg/dL — ABNORMAL HIGH (ref 70–99)
Potassium: 4 mEq/L (ref 3.5–5.1)
Sodium: 140 mEq/L (ref 135–145)
Total Bilirubin: 0.6 mg/dL (ref 0.2–1.2)
Total Protein: 7.5 g/dL (ref 6.0–8.3)

## 2020-07-20 LAB — CBC WITH DIFFERENTIAL/PLATELET
Basophils Absolute: 0.1 10*3/uL (ref 0.0–0.1)
Basophils Relative: 0.7 % (ref 0.0–3.0)
Eosinophils Absolute: 0 10*3/uL (ref 0.0–0.7)
Eosinophils Relative: 0.3 % (ref 0.0–5.0)
HCT: 37.4 % (ref 36.0–46.0)
Hemoglobin: 12.5 g/dL (ref 12.0–15.0)
Lymphocytes Relative: 18.7 % (ref 12.0–46.0)
Lymphs Abs: 2.6 10*3/uL (ref 0.7–4.0)
MCHC: 33.5 g/dL (ref 30.0–36.0)
MCV: 89.6 fl (ref 78.0–100.0)
Monocytes Absolute: 0.5 10*3/uL (ref 0.1–1.0)
Monocytes Relative: 3.5 % (ref 3.0–12.0)
Neutro Abs: 10.5 10*3/uL — ABNORMAL HIGH (ref 1.4–7.7)
Neutrophils Relative %: 76.8 % (ref 43.0–77.0)
Platelets: 398 10*3/uL (ref 150.0–400.0)
RBC: 4.17 Mil/uL (ref 3.87–5.11)
RDW: 13.4 % (ref 11.5–15.5)
WBC: 13.6 10*3/uL — ABNORMAL HIGH (ref 4.0–10.5)

## 2020-07-20 LAB — URINALYSIS, ROUTINE W REFLEX MICROSCOPIC
Bilirubin Urine: NEGATIVE
Nitrite: NEGATIVE
Specific Gravity, Urine: 1.025 (ref 1.000–1.030)
Total Protein, Urine: NEGATIVE
Urine Glucose: NEGATIVE
Urobilinogen, UA: 0.2 (ref 0.0–1.0)
pH: 6 (ref 5.0–8.0)

## 2020-07-20 LAB — LIPID PANEL
Cholesterol: 212 mg/dL — ABNORMAL HIGH (ref 0–200)
HDL: 59.2 mg/dL (ref >39.00)
LDL Cholesterol: 137 mg/dL — ABNORMAL HIGH (ref 0–99)
NonHDL: 152.5
Total CHOL/HDL Ratio: 4
Triglycerides: 76 mg/dL (ref 0.0–149.0)
VLDL: 15.2 mg/dL (ref 0.0–40.0)

## 2020-07-20 LAB — H. PYLORI ANTIBODY, IGG: H Pylori IgG: NEGATIVE

## 2020-07-20 LAB — TSH: TSH: 0.36 u[IU]/mL (ref 0.35–4.50)

## 2020-07-20 MED ORDER — OXYCODONE-ACETAMINOPHEN 5-325 MG PO TABS: 1.0000 | ORAL_TABLET | Freq: Three times a day (TID) | ORAL | 0 refills | Status: DC | PRN

## 2020-07-20 MED ORDER — LORAZEPAM 0.5 MG PO TABS: 0.5000 mg | ORAL_TABLET | Freq: Two times a day (BID) | ORAL | 1 refills | Status: DC | PRN

## 2020-07-20 NOTE — Patient Instructions (Signed)
Try yoga  

## 2020-07-20 NOTE — Assessment & Plan Note (Addendum)
Valerian root Declined SSRI Lorazepam prn  Potential benefits of a long term benzodiazepines  use as well as potential risks  and complications were explained to the patient and were aknowledged. PTSD type symptoms following her mother's death were discussed with the patient.  She can do counseling through hospice, however, she is not interested at the moment

## 2020-07-20 NOTE — Assessment & Plan Note (Signed)
On Vit D 

## 2020-07-20 NOTE — Progress Notes (Signed)
Subjective:  Patient ID: Sheila Lin, female    DOB: 1961-02-26  Age: 60 y.o. MRN: [id number]  CC: Annual Exam (Req refill on Lorazepam & Oxycodone)   HPI Sheila Lin presents for a well exam C/o anxiety. Mom died. Father is dying now. C/o LBP-worse now since her dad needs more help Dtr w/Freidrix ataxia - handicapped and requiring help C/o mom died of stomach cancer - they look alike physically and Blair Promise is worried about her risk of stomach cancer  Outpatient Medications Prior to Visit  Medication Sig Dispense Refill  . atenolol (TENORMIN) 50 MG tablet Take 1 tablet (50 mg total) by mouth daily. 90 tablet 3  . CALCIUM PO Take 800 mg by mouth daily.     . Cholecalciferol (VITAMIN D3) 2000 units capsule Take 1 capsule (2,000 Units total) by mouth daily. 100 capsule 3  . LORazepam (ATIVAN) 0.5 MG tablet Take 1 tablet (0.5 mg total) by mouth 2 (two) times daily as needed for anxiety or sleep. 30 tablet 1  . oxyCODONE-acetaminophen (PERCOCET/ROXICET) 5-325 MG tablet TAKE 1/2 TO 1 TABLET BY MOUTH EVERY 8 HOURS AS NEEDED FOR SEVERE PAIN. 30 tablet 0  . triamcinolone cream (KENALOG) 0.1 % Apply 1 application topically 2 (two) times daily. (Patient not taking: Reported on 07/20/2020) 80 g 3   No facility-administered medications prior to visit.    ROS: Review of Systems  Constitutional: Negative for activity change, appetite change, chills, fatigue and unexpected weight change.  HENT: Negative for congestion, mouth sores and sinus pressure.   Eyes: Negative for visual disturbance.  Respiratory: Negative for cough and chest tightness.   Gastrointestinal: Negative for abdominal pain and nausea.  Genitourinary: Negative for difficulty urinating, frequency and vaginal pain.  Musculoskeletal: Positive for back pain. Negative for gait problem.  Skin: Negative for pallor and rash.  Neurological: Negative for dizziness, tremors, weakness, numbness and headaches.   Psychiatric/Behavioral: Negative for agitation, confusion, decreased concentration, sleep disturbance and suicidal ideas. The patient is nervous/anxious.     Objective:  BP 132/80 (BP Location: Left Arm)   Pulse 99   Temp 99.5 F (37.5 C) (Oral)   Ht 5\' 8"  (1.727 m)   Wt 180 lb 3.2 oz (81.7 kg)   SpO2 98%   BMI 27.40 kg/m   BP Readings from Last 3 Encounters:  07/20/20 132/80  07/20/19 (!) 142/88  03/30/19 (!) 142/86    Wt Readings from Last 3 Encounters:  07/20/20 180 lb 3.2 oz (81.7 kg)  07/20/19 185 lb (83.9 kg)  03/30/19 180 lb (81.6 kg)    Physical Exam Constitutional:      General: She is not in acute distress.    Appearance: She is well-developed.  HENT:     Head: Normocephalic.     Right Ear: External ear normal.     Left Ear: External ear normal.     Nose: Nose normal.     Mouth/Throat:     Mouth: Oropharynx is clear and moist.  Eyes:     General:        Right eye: No discharge.        Left eye: No discharge.     Conjunctiva/sclera: Conjunctivae normal.     Pupils: Pupils are equal, round, and reactive to light.  Neck:     Thyroid: No thyromegaly.     Vascular: No JVD.     Trachea: No tracheal deviation.  Cardiovascular:     Rate and Rhythm: Normal rate and  regular rhythm.     Heart sounds: Normal heart sounds.  Pulmonary:     Effort: No respiratory distress.     Breath sounds: No stridor. No wheezing.  Abdominal:     General: Bowel sounds are normal. There is no distension.     Palpations: Abdomen is soft. There is no mass.     Tenderness: There is no abdominal tenderness. There is no guarding or rebound.  Musculoskeletal:        General: No tenderness or edema.     Cervical back: Normal range of motion and neck supple.  Lymphadenopathy:     Cervical: No cervical adenopathy.  Skin:    Findings: No erythema or rash.  Neurological:     Mental Status: She is oriented to person, place, and time.     Cranial Nerves: No cranial nerve deficit.      Motor: No abnormal muscle tone.     Coordination: Coordination normal.     Deep Tendon Reflexes: Reflexes normal.  Psychiatric:        Mood and Affect: Mood and affect normal.        Behavior: Behavior normal.        Thought Content: Thought content normal.        Judgment: Judgment normal.   LS spine tender with range of motion  I spent 22 minutes in addition to time for CPX wellness examination in preparing to see the patient by review of recent labs, imaging , ordering medications, tests or procedures, and documenting clinical information in the EHR including the differential diagnosis, treatment, and any further evaluation and other management of  anxiety, back pain, opioid use.         Lab Results  Component Value Date   WBC 7.6 10/06/2018   HGB 12.6 10/06/2018   HCT 38.8 10/06/2018   PLT 328 10/06/2018   GLUCOSE 104 (H) 10/06/2018   CHOL 183 07/17/2018   TRIG 81.0 07/17/2018   HDL 53.10 07/17/2018   LDLDIRECT 156.2 03/15/2010   LDLCALC 114 (H) 07/17/2018   ALT 18 07/17/2018   AST 13 07/17/2018   NA 140 10/06/2018   K 3.7 10/06/2018   CL 104 10/06/2018   CREATININE 0.48 10/06/2018   BUN 17 10/06/2018   CO2 24 10/06/2018   TSH 0.42 07/17/2018    No results found.  Assessment & Plan:     Walker Kehr, MD

## 2020-07-20 NOTE — Assessment & Plan Note (Signed)

## 2020-07-20 NOTE — Assessment & Plan Note (Signed)
Worse. Percocet rare use.  Prescription provided.  Potential benefits of a short or long term opioids use as well as potential risks (i.e. addiction risk, apnea etc) and complications (i.e. Somnolence, constipation and others) were explained to the patient and were aknowledged.

## 2020-07-20 NOTE — Assessment & Plan Note (Addendum)
mom died of stomach cancer - they look alike physically and Blair Promise is worried about her risk of stomach cancer  H pylori GI referral

## 2020-07-21 ENCOUNTER — Other Ambulatory Visit: Payer: Self-pay | Admitting: Internal Medicine

## 2020-07-21 MED ORDER — CIPROFLOXACIN HCL 500 MG PO TABS: 500.0000 mg | ORAL_TABLET | Freq: Two times a day (BID) | ORAL | 0 refills | Status: DC

## 2020-07-22 ENCOUNTER — Telehealth: Payer: Self-pay | Admitting: *Deleted

## 2020-07-22 NOTE — Telephone Encounter (Signed)
Rec'd PA for pt Oxycodone 5/325 mg. Completed via cover-my-meds w/ (Key: [id number]). Waiting on approval status.Marland KitchenJohny Lin

## 2020-07-27 NOTE — Telephone Encounter (Signed)
Rec'd status on PA it states this request has received an Unfavorable outcome. Med has been denied information has been sent to pt and MD../lmb

## 2020-08-12 ENCOUNTER — Other Ambulatory Visit: Payer: Self-pay | Admitting: Internal Medicine

## 2020-08-15 ENCOUNTER — Other Ambulatory Visit: Payer: Self-pay | Admitting: Internal Medicine

## 2020-10-27 ENCOUNTER — Encounter: Payer: Self-pay | Admitting: Internal Medicine

## 2020-10-27 ENCOUNTER — Ambulatory Visit (INDEPENDENT_AMBULATORY_CARE_PROVIDER_SITE_OTHER): Payer: BC Managed Care – PPO | Admitting: Internal Medicine

## 2020-10-27 ENCOUNTER — Other Ambulatory Visit: Payer: Self-pay

## 2020-10-27 DIAGNOSIS — M545 Low back pain, unspecified: Secondary | ICD-10-CM

## 2020-10-27 DIAGNOSIS — S20212A Contusion of left front wall of thorax, initial encounter: Secondary | ICD-10-CM

## 2020-10-27 DIAGNOSIS — E559 Vitamin D deficiency, unspecified: Secondary | ICD-10-CM

## 2020-10-27 DIAGNOSIS — R0789 Other chest pain: Secondary | ICD-10-CM | POA: Diagnosis not present

## 2020-10-27 DIAGNOSIS — G8929 Other chronic pain: Secondary | ICD-10-CM

## 2020-10-27 MED ORDER — ATENOLOL 50 MG PO TABS: 50.0000 mg | ORAL_TABLET | Freq: Every day | ORAL | 3 refills | Status: DC

## 2020-10-27 MED ORDER — OXYCODONE-ACETAMINOPHEN 5-325 MG PO TABS: 1.0000 | ORAL_TABLET | Freq: Three times a day (TID) | ORAL | 0 refills | Status: DC | PRN

## 2020-10-27 NOTE — Patient Instructions (Signed)
Broken Rib Brace

## 2020-10-27 NOTE — Assessment & Plan Note (Signed)
L rib X ray Ref percocet Ice Broken Rib Brace

## 2020-10-27 NOTE — Progress Notes (Signed)
Subjective:  Patient ID: Sheila Lin, female    DOB: 10-Oct-1960  Age: 60 y.o. MRN: [id number]  CC: Rib Injury (? (L) side. Pt states she think she broken a rib. 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  She is complaining of severe pain in the lower ribs. F/u HTN  Outpatient Medications Prior to Visit  Medication Sig Dispense Refill  . atenolol (TENORMIN) 50 MG tablet TAKE 1 TABLET BY MOUTH DAILY. 90 tablet 3  . CALCIUM PO Take 800 mg by mouth daily.     . Cholecalciferol (VITAMIN D3) 2000 units capsule Take 1 capsule (2,000 Units total) by mouth daily. 100 capsule 3  . LORazepam (ATIVAN) 0.5 MG tablet Take 1 tablet (0.5 mg total) by mouth 2 (two) times daily as needed for anxiety or sleep. 30 tablet 1  . oxyCODONE-acetaminophen (PERCOCET/ROXICET) 5-325 MG tablet Take 1 tablet by mouth every 8 (eight) hours as needed for severe pain. 30 tablet 0  . ciprofloxacin (CIPRO) 500 MG tablet Take 1 tablet (500 mg total) by mouth 2 (two) times daily. (Patient not taking: Reported on 10/27/2020) 14 tablet 0   No facility-administered medications prior to visit.    ROS: Review of Systems  Constitutional: Negative for activity change, appetite change, chills, fatigue and unexpected weight change.  HENT: Negative for congestion, mouth sores and sinus pressure.   Eyes: Negative for visual disturbance.  Respiratory: Negative for cough, chest tightness and shortness of breath.   Cardiovascular: Positive for chest pain.  Gastrointestinal: Negative for abdominal pain and nausea.  Genitourinary: Negative for difficulty urinating, frequency and vaginal pain.  Musculoskeletal: Positive for back pain. Negative for gait problem.  Skin: Negative for pallor and rash.  Neurological: Negative for dizziness, tremors, weakness, numbness  and headaches.  Psychiatric/Behavioral: Negative for confusion and sleep disturbance.    Objective:  BP 120/76 (BP Location: Left Arm)   Pulse 67   Temp 99.2 F (37.3 C) (Oral)   Ht 5\' 8"  (1.727 m)   Wt 178 lb 9.6 oz (81 kg)   SpO2 98%   BMI 27.16 kg/m   BP Readings from Last 3 Encounters:  10/27/20 120/76  07/20/20 132/80  07/20/19 (!) 142/88    Wt Readings from Last 3 Encounters:  10/27/20 178 lb 9.6 oz (81 kg)  07/20/20 180 lb 3.2 oz (81.7 kg)  07/20/19 185 lb (83.9 kg)    Physical Exam Constitutional:      General: She is not in acute distress.    Appearance: She is well-developed.  HENT:     Head: Normocephalic.     Right Ear: External ear normal.     Left Ear: External ear normal.     Nose: Nose normal.  Eyes:     General:        Right eye: No discharge.        Left eye: No discharge.     Conjunctiva/sclera: Conjunctivae normal.     Pupils: Pupils are equal, round, and reactive to light.  Neck:     Thyroid: No thyromegaly.     Vascular: No JVD.     Trachea: No tracheal deviation.  Cardiovascular:     Rate and Rhythm: Normal rate and regular rhythm.     Heart sounds: Normal  heart sounds.  Pulmonary:     Effort: No respiratory distress.     Breath sounds: No stridor. No wheezing.  Abdominal:     General: Bowel sounds are normal. There is no distension.     Palpations: Abdomen is soft. There is no mass.     Tenderness: There is no abdominal tenderness. There is no guarding or rebound.  Musculoskeletal:        General: No tenderness.     Cervical back: Normal range of motion and neck supple.  Lymphadenopathy:     Cervical: No cervical adenopathy.  Skin:    Findings: No erythema or rash.  Neurological:     Cranial Nerves: No cranial nerve deficit.     Motor: No abnormal muscle tone.     Coordination: Coordination normal.     Deep Tendon Reflexes: Reflexes normal.  Psychiatric:        Behavior: Behavior normal.        Thought Content: Thought  content normal.        Judgment: Judgment normal.     L lower ribs are painful to palpation   Lab Results  Component Value Date   WBC 13.6 (H) 07/20/2020   HGB 12.5 07/20/2020   HCT 37.4 07/20/2020   PLT 398.0 07/20/2020   GLUCOSE 115 (H) 07/20/2020   CHOL 212 (H) 07/20/2020   TRIG 76.0 07/20/2020   HDL 59.20 07/20/2020   LDLDIRECT 156.2 03/15/2010   LDLCALC 137 (H) 07/20/2020   ALT 16 07/20/2020   AST 18 07/20/2020   NA 140 07/20/2020   K 4.0 07/20/2020   CL 105 07/20/2020   CREATININE 0.57 07/20/2020   BUN 18 07/20/2020   CO2 28 07/20/2020   TSH 0.36 07/20/2020    No results found.  Assessment & Plan:    Follow-up: No follow-ups on file.  Walker Kehr, MD

## 2020-10-29 ENCOUNTER — Encounter: Payer: Self-pay | Admitting: Internal Medicine

## 2020-10-29 NOTE — Assessment & Plan Note (Signed)
Contusion.  Obtain rib x-ray.  Percocet refilled  Potential benefits of a short/long term opioids use as well as potential risks (i.e. addiction risk, apnea etc) and complications (i.e. Somnolence, constipation and others) were explained to the patient and were aknowledged.

## 2020-10-29 NOTE — Assessment & Plan Note (Signed)
On Vit D 

## 2020-10-29 NOTE — Assessment & Plan Note (Addendum)
Intermittent ?Percocet - rare use.  Prescription provided. ? Potential benefits of a short or long term opioids use as well as potential risks (i.e. addiction risk, apnea etc) and complications (i.e. Somnolence, constipation and others) were explained to the patient and were aknowledged. ?

## 2020-11-04 ENCOUNTER — Telehealth: Payer: Self-pay | Admitting: *Deleted

## 2020-11-04 NOTE — Telephone Encounter (Signed)
Rec'd PA for pt Oxycodone 5/325 mg. Completed vis cover-my-meds w/ (Key: [id number]). Rec;d msg stating " This request has received a Favorable outcome. Coverage Starts on: 11/04/2020 12:00:00 AM, Coverage Ends on: 02/02/2021 12:00:00 AM. Faxed info to [facility].Marland KitchenJohny Chess

## 2021-01-17 ENCOUNTER — Encounter: Payer: BC Managed Care – PPO | Admitting: Gastroenterology

## 2021-01-23 DIAGNOSIS — Z1211 Encounter for screening for malignant neoplasm of colon: Secondary | ICD-10-CM | POA: Diagnosis not present

## 2021-01-23 DIAGNOSIS — K5730 Diverticulosis of large intestine without perforation or abscess without bleeding: Secondary | ICD-10-CM | POA: Diagnosis not present

## 2021-02-01 ENCOUNTER — Encounter: Payer: Self-pay | Admitting: Internal Medicine

## 2021-02-01 ENCOUNTER — Other Ambulatory Visit: Payer: Self-pay

## 2021-02-01 ENCOUNTER — Ambulatory Visit (INDEPENDENT_AMBULATORY_CARE_PROVIDER_SITE_OTHER): Payer: BC Managed Care – PPO | Admitting: Internal Medicine

## 2021-02-01 DIAGNOSIS — F410 Panic disorder [episodic paroxysmal anxiety] without agoraphobia: Secondary | ICD-10-CM | POA: Diagnosis not present

## 2021-02-01 DIAGNOSIS — E559 Vitamin D deficiency, unspecified: Secondary | ICD-10-CM | POA: Diagnosis not present

## 2021-02-01 DIAGNOSIS — R739 Hyperglycemia, unspecified: Secondary | ICD-10-CM

## 2021-02-01 DIAGNOSIS — M545 Low back pain, unspecified: Secondary | ICD-10-CM

## 2021-02-01 DIAGNOSIS — G8929 Other chronic pain: Secondary | ICD-10-CM

## 2021-02-01 MED ORDER — OXYCODONE-ACETAMINOPHEN 5-325 MG PO TABS: 1.0000 | ORAL_TABLET | Freq: Three times a day (TID) | ORAL | 0 refills | Status: DC | PRN

## 2021-02-01 MED ORDER — LORAZEPAM 0.5 MG PO TABS: 0.5000 mg | ORAL_TABLET | Freq: Two times a day (BID) | ORAL | 1 refills | Status: DC | PRN

## 2021-02-01 NOTE — Assessment & Plan Note (Signed)
On Vit D 

## 2021-02-01 NOTE — Progress Notes (Signed)
Subjective:  Patient ID: Sheila Lin, female    DOB: 1960-07-11  Age: 60 y.o. 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 90 tablet 3   CALCIUM PO Take 800 mg by mouth daily.      Cholecalciferol (VITAMIN D3) 2000 units capsule Take 1 capsule (2,000 Units total) by mouth daily. 100 capsule 3   LORazepam (ATIVAN) 0.5 MG tablet Take 1 tablet (0.5 mg total) by mouth 2 (two) times daily as needed for anxiety or sleep. 30 tablet 1   oxyCODONE-acetaminophen (PERCOCET/ROXICET) 5-325 MG tablet Take 1 tablet by mouth every 8 (eight) hours as needed for severe pain. 30 tablet 0   No facility-administered medications prior to visit.    ROS: Review of Systems  Constitutional:  Negative for activity change, appetite change, chills, fatigue and unexpected weight change.  HENT:  Negative for congestion, mouth sores and sinus pressure.   Eyes:  Negative for visual disturbance.  Respiratory:  Negative for cough and chest tightness.   Gastrointestinal:  Negative for abdominal pain and nausea.  Genitourinary:  Negative for difficulty urinating, frequency and vaginal pain.  Musculoskeletal:  Positive for back pain. Negative for gait problem.  Skin:  Negative for pallor and rash.  Neurological:  Negative for dizziness, tremors, weakness, numbness and headaches.  Psychiatric/Behavioral:  Negative for confusion, sleep disturbance and suicidal ideas. The patient is nervous/anxious.    Objective:  BP 128/80 (BP Location: Left Arm)   Pulse 76   Temp 98.3 F (36.8 C) (Oral)   Ht '5\' 8"'$  (1.727 m)   Wt 179 lb 3.2 oz (81.3 kg)   SpO2 98%   BMI 27.25 kg/m   BP Readings from Last 3 Encounters:  02/01/21 128/80  10/27/20 120/76   07/20/20 132/80    Wt Readings from Last 3 Encounters:  02/01/21 179 lb 3.2 oz (81.3 kg)  10/27/20 178 lb 9.6 oz (81 kg)  07/20/20 180 lb 3.2 oz (81.7 kg)    Physical Exam Constitutional:      General: She is not in acute distress.    Appearance: She is well-developed.  HENT:     Head: Normocephalic.     Right Ear: External ear normal.     Left Ear: External ear normal.     Nose: Nose normal.  Eyes:     General:        Right eye: No discharge.        Left eye: No discharge.     Conjunctiva/sclera: Conjunctivae normal.     Pupils: Pupils are equal, round, and reactive to light.  Neck:     Thyroid: No thyromegaly.     Vascular: No JVD.     Trachea: No tracheal deviation.  Cardiovascular:     Rate and Rhythm: Normal rate and regular rhythm.     Heart sounds: Normal heart sounds.  Pulmonary:     Effort: No respiratory distress.     Breath sounds: No stridor. No wheezing.  Abdominal:     General: Bowel sounds are normal. There is no distension.     Palpations: Abdomen is soft. There is no mass.     Tenderness: There is no abdominal tenderness. There is no guarding  or rebound.  Musculoskeletal:        General: Tenderness present.     Cervical back: Normal range of motion and neck supple. No rigidity.  Lymphadenopathy:     Cervical: No cervical adenopathy.  Skin:    Findings: No erythema or rash.  Neurological:     Mental Status: She is oriented to person, place, and time.     Cranial Nerves: No cranial nerve deficit.     Motor: No abnormal muscle tone.     Coordination: Coordination normal.     Deep Tendon Reflexes: Reflexes normal.  Psychiatric:        Behavior: Behavior normal.        Thought Content: Thought content normal.        Judgment: Judgment normal.   LS tender  Lab Results  Component Value Date   WBC 13.6 (H) 07/20/2020   HGB 12.5 07/20/2020   HCT 37.4 07/20/2020   PLT 398.0 07/20/2020   GLUCOSE 115 (H) 07/20/2020   CHOL 212 (H) 07/20/2020    TRIG 76.0 07/20/2020   HDL 59.20 07/20/2020   LDLDIRECT 156.2 03/15/2010   LDLCALC 137 (H) 07/20/2020   ALT 16 07/20/2020   AST 18 07/20/2020   NA 140 07/20/2020   K 4.0 07/20/2020   CL 105 07/20/2020   CREATININE 0.57 07/20/2020   BUN 18 07/20/2020   CO2 28 07/20/2020   TSH 0.36 07/20/2020    No results found.  Assessment & Plan:    Walker Kehr, MD

## 2021-02-01 NOTE — Assessment & Plan Note (Signed)
Continue to monitor glucose

## 2021-02-01 NOTE — Assessment & Plan Note (Addendum)
Percocet - rare use.  Prescription provided.  Potential benefits of a short or long term opioids use as well as potential risks (i.e. addiction risk, apnea etc) and complications (i.e. Somnolence, constipation and others) were explained to the patient and were aknowledged. 

## 2021-02-01 NOTE — Assessment & Plan Note (Signed)
Lorazepam prn  Potential benefits of a long term benzodiazepines  use as well as potential risks  and complications were explained to the patient and were aknowledged.  

## 2021-03-15 ENCOUNTER — Other Ambulatory Visit: Payer: Self-pay | Admitting: Obstetrics and Gynecology

## 2021-03-15 DIAGNOSIS — N6310 Unspecified lump in the right breast, unspecified quadrant: Secondary | ICD-10-CM

## 2021-04-12 ENCOUNTER — Ambulatory Visit: Payer: BC Managed Care – PPO

## 2021-04-12 ENCOUNTER — Ambulatory Visit
Admission: RE | Admit: 2021-04-12 | Discharge: 2021-04-12 | Disposition: A | Discharge status: Home / Self Care | Payer: BC Managed Care – PPO | Source: Ambulatory Visit | Attending: Obstetrics and Gynecology | Admitting: Obstetrics and Gynecology

## 2021-04-12 ENCOUNTER — Encounter: Payer: Self-pay | Admitting: Internal Medicine

## 2021-04-12 ENCOUNTER — Other Ambulatory Visit: Payer: Self-pay

## 2021-04-12 ENCOUNTER — Ambulatory Visit (INDEPENDENT_AMBULATORY_CARE_PROVIDER_SITE_OTHER): Payer: BC Managed Care – PPO | Admitting: Internal Medicine

## 2021-04-12 VITALS — BP 130/72 | HR 75 | Temp 98.0°F | Ht 68.0 in | Wt 179.4 lb

## 2021-04-12 DIAGNOSIS — G8929 Other chronic pain: Secondary | ICD-10-CM

## 2021-04-12 DIAGNOSIS — N6310 Unspecified lump in the right breast, unspecified quadrant: Secondary | ICD-10-CM

## 2021-04-12 DIAGNOSIS — M545 Low back pain, unspecified: Secondary | ICD-10-CM

## 2021-04-12 DIAGNOSIS — N3000 Acute cystitis without hematuria: Secondary | ICD-10-CM

## 2021-04-12 DIAGNOSIS — F410 Panic disorder [episodic paroxysmal anxiety] without agoraphobia: Secondary | ICD-10-CM | POA: Diagnosis not present

## 2021-04-12 DIAGNOSIS — R922 Inconclusive mammogram: Secondary | ICD-10-CM | POA: Diagnosis not present

## 2021-04-12 DIAGNOSIS — R300 Dysuria: Secondary | ICD-10-CM

## 2021-04-12 DIAGNOSIS — Z23 Encounter for immunization: Secondary | ICD-10-CM

## 2021-04-12 LAB — POCT URINALYSIS DIPSTICK
Bilirubin, UA: NEGATIVE
Glucose, UA: NEGATIVE
Ketones, UA: NEGATIVE
Leukocytes, UA: NEGATIVE
Nitrite, UA: NEGATIVE
Protein, UA: NEGATIVE
Spec Grav, UA: 1.01 (ref 1.010–1.025)
Urobilinogen, UA: 0.2 E.U./dL
pH, UA: 6 (ref 5.0–8.0)

## 2021-04-12 MED ORDER — OXYCODONE-ACETAMINOPHEN 5-325 MG PO TABS: 1.0000 | ORAL_TABLET | Freq: Three times a day (TID) | ORAL | 0 refills | Status: DC | PRN

## 2021-04-12 MED ORDER — CEPHALEXIN 500 MG PO CAPS: 500.0000 mg | ORAL_CAPSULE | Freq: Three times a day (TID) | ORAL | 0 refills | Status: DC

## 2021-04-12 NOTE — Assessment & Plan Note (Signed)
Will check UA Keflex if needed

## 2021-04-12 NOTE — Assessment & Plan Note (Signed)
Percocet - rare use.  Prescription provided.  Potential benefits of a short or long term opioids use as well as potential risks (i.e. addiction risk, apnea etc) and complications (i.e. Somnolence, constipation and others) were explained to the patient and were aknowledged. Getting more help w/dad

## 2021-04-12 NOTE — Progress Notes (Signed)
Subjective:  Patient ID: Sheila Lin, female    DOB: 02/18/1961  Age: 60 y.o. MRN: [id number]  CC: Urinary Tract Infection (Flu shot) and Medication Refill (Oxycodone)   HPI Sheila Lin presents for a possible UTI - c/o stinging and urgency x 2 weeks F/u LBP, anxiety, stress w/dad. Getting more help w/him.  Outpatient Medications Prior to Visit  Medication Sig Dispense Refill   atenolol (TENORMIN) 50 MG tablet Take 1 tablet (50 mg total) by mouth daily. 90 tablet 3   CALCIUM PO Take 800 mg by mouth daily.      Cholecalciferol (VITAMIN D3) 2000 units capsule Take 1 capsule (2,000 Units total) by mouth daily. 100 capsule 3   LORazepam (ATIVAN) 0.5 MG tablet Take 1 tablet (0.5 mg total) by mouth 2 (two) times daily as needed for anxiety or sleep. 30 tablet 1   oxyCODONE-acetaminophen (PERCOCET/ROXICET) 5-325 MG tablet Take 1 tablet by mouth every 8 (eight) hours as needed for severe pain. 30 tablet 0   No facility-administered medications prior to visit.    ROS: Review of Systems  Constitutional:  Negative for activity change, appetite change, chills, fatigue and unexpected weight change.  HENT:  Negative for congestion, mouth sores and sinus pressure.   Eyes:  Negative for visual disturbance.  Respiratory:  Negative for cough and chest tightness.   Gastrointestinal:  Negative for abdominal pain and nausea.  Genitourinary:  Positive for dysuria, frequency and urgency. Negative for difficulty urinating and vaginal pain.  Musculoskeletal:  Positive for back pain. Negative for gait problem.  Skin:  Negative for pallor and rash.  Neurological:  Negative for dizziness, tremors, weakness, numbness and headaches.  Psychiatric/Behavioral:  Negative for confusion, dysphoric mood and sleep disturbance. The patient is not nervous/anxious.    Objective:  BP 130/72 (BP Location: Left Arm)   Pulse 75   Temp 98 F (36.7 C) (Oral)   Ht 5\' 8"  (1.727 m)   Wt 179 lb 6.4 oz (81.4 kg)    SpO2 98%   BMI 27.28 kg/m   BP Readings from Last 3 Encounters:  04/12/21 130/72  02/01/21 128/80  10/27/20 120/76    Wt Readings from Last 3 Encounters:  04/12/21 179 lb 6.4 oz (81.4 kg)  02/01/21 179 lb 3.2 oz (81.3 kg)  10/27/20 178 lb 9.6 oz (81 kg)    Physical Exam Constitutional:      General: She is not in acute distress.    Appearance: She is well-developed.  HENT:     Head: Normocephalic.     Right Ear: External ear normal.     Left Ear: External ear normal.     Nose: Nose normal.  Eyes:     General:        Right eye: No discharge.        Left eye: No discharge.     Conjunctiva/sclera: Conjunctivae normal.     Pupils: Pupils are equal, round, and reactive to light.  Neck:     Thyroid: No thyromegaly.     Vascular: No JVD.     Trachea: No tracheal deviation.  Cardiovascular:     Rate and Rhythm: Normal rate and regular rhythm.     Heart sounds: Normal heart sounds.  Pulmonary:     Effort: No respiratory distress.     Breath sounds: No stridor. No wheezing.  Abdominal:     General: Bowel sounds are normal. There is no distension.     Palpations: Abdomen is soft.  There is no mass.     Tenderness: There is no abdominal tenderness. There is no guarding or rebound.  Musculoskeletal:        General: Tenderness present.     Cervical back: Normal range of motion and neck supple.  Lymphadenopathy:     Cervical: No cervical adenopathy.  Skin:    Findings: No erythema or rash.  Neurological:     Mental Status: She is oriented to person, place, and time.     Cranial Nerves: No cranial nerve deficit.     Motor: No abnormal muscle tone.     Coordination: Coordination normal.     Deep Tendon Reflexes: Reflexes normal.  Psychiatric:        Behavior: Behavior normal.        Thought Content: Thought content normal.        Judgment: Judgment normal.   LS tender   Lab Results  Component Value Date   WBC 13.6 (H) 07/20/2020   HGB 12.5 07/20/2020   HCT 37.4  07/20/2020   PLT 398.0 07/20/2020   GLUCOSE 115 (H) 07/20/2020   CHOL 212 (H) 07/20/2020   TRIG 76.0 07/20/2020   HDL 59.20 07/20/2020   LDLDIRECT 156.2 03/15/2010   LDLCALC 137 (H) 07/20/2020   ALT 16 07/20/2020   AST 18 07/20/2020   NA 140 07/20/2020   K 4.0 07/20/2020   CL 105 07/20/2020   CREATININE 0.57 07/20/2020   BUN 18 07/20/2020   CO2 28 07/20/2020   TSH 0.36 07/20/2020    No results found.  Assessment & Plan:   Problem List Items Addressed This Visit     Anxiety attack    Better      Dysuria    Will check UA Keflex if needed      Relevant Orders   Urinalysis   Low back pain    Percocet - rare use.  Prescription provided.  Potential benefits of a short or long term opioids use as well as potential risks (i.e. addiction risk, apnea etc) and complications (i.e. Somnolence, constipation and others) were explained to the patient and were aknowledged. Getting more help w/dad      Relevant Medications   oxyCODONE-acetaminophen (PERCOCET/ROXICET) 5-325 MG tablet   Other Relevant Orders   Urinalysis   Other Visit Diagnoses     Acute cystitis without hematuria    -  Primary   Relevant Orders   POCT Urinalysis Dipstick (Completed)   Urinalysis       Meds ordered this encounter  Medications   oxyCODONE-acetaminophen (PERCOCET/ROXICET) 5-325 MG tablet    Sig: Take 1 tablet by mouth every 8 (eight) hours as needed for severe pain.    Dispense:  30 tablet    Refill:  0   cephALEXin (KEFLEX) 500 MG capsule    Sig: Take 1 capsule (500 mg total) by mouth 3 (three) times daily.    Dispense:  12 capsule    Refill:  0     Follow-up: No follow-ups on file.  Walker Kehr, MD

## 2021-04-12 NOTE — Addendum Note (Signed)
Addended by: Earnstine Regal on: 04/12/2021 10:01 AM   Modules accepted: Orders

## 2021-04-12 NOTE — Assessment & Plan Note (Signed)
Better  

## 2021-10-02 ENCOUNTER — Ambulatory Visit (INDEPENDENT_AMBULATORY_CARE_PROVIDER_SITE_OTHER): Payer: Managed Care, Other (non HMO) | Admitting: Internal Medicine

## 2021-10-02 ENCOUNTER — Encounter: Payer: Self-pay | Admitting: Internal Medicine

## 2021-10-02 VITALS — BP 118/72 | HR 84 | Temp 99.2°F | Ht 68.0 in | Wt 182.0 lb

## 2021-10-02 DIAGNOSIS — Z136 Encounter for screening for cardiovascular disorders: Secondary | ICD-10-CM | POA: Diagnosis not present

## 2021-10-02 DIAGNOSIS — M545 Low back pain, unspecified: Secondary | ICD-10-CM | POA: Diagnosis not present

## 2021-10-02 DIAGNOSIS — K5792 Diverticulitis of intestine, part unspecified, without perforation or abscess without bleeding: Secondary | ICD-10-CM | POA: Insufficient documentation

## 2021-10-02 DIAGNOSIS — N3000 Acute cystitis without hematuria: Secondary | ICD-10-CM

## 2021-10-02 DIAGNOSIS — Z0000 Encounter for general adult medical examination without abnormal findings: Secondary | ICD-10-CM

## 2021-10-02 DIAGNOSIS — R300 Dysuria: Secondary | ICD-10-CM

## 2021-10-02 DIAGNOSIS — G8929 Other chronic pain: Secondary | ICD-10-CM

## 2021-10-02 LAB — COMPREHENSIVE METABOLIC PANEL
ALT: 13 U/L (ref 0–35)
AST: 16 U/L (ref 0–37)
Albumin: 4.4 g/dL (ref 3.5–5.2)
Alkaline Phosphatase: 70 U/L (ref 39–117)
BUN: 17 mg/dL (ref 6–23)
CO2: 28 mEq/L (ref 19–32)
Calcium: 10.4 mg/dL (ref 8.4–10.5)
Chloride: 103 mEq/L (ref 96–112)
Creatinine, Ser: 0.57 mg/dL (ref 0.40–1.20)
GFR: 97.52 mL/min (ref >60.00)
Glucose, Bld: 110 mg/dL — ABNORMAL HIGH (ref 70–99)
Potassium: 4 mEq/L (ref 3.5–5.1)
Sodium: 139 mEq/L (ref 135–145)
Total Bilirubin: 0.4 mg/dL (ref 0.2–1.2)
Total Protein: 7.1 g/dL (ref 6.0–8.3)

## 2021-10-02 LAB — CBC WITH DIFFERENTIAL/PLATELET
Basophils Absolute: 0.1 10*3/uL (ref 0.0–0.1)
Basophils Relative: 1.1 % (ref 0.0–3.0)
Eosinophils Absolute: 0.1 10*3/uL (ref 0.0–0.7)
Eosinophils Relative: 1 % (ref 0.0–5.0)
HCT: 37 % (ref 36.0–46.0)
Hemoglobin: 12.4 g/dL (ref 12.0–15.0)
Lymphocytes Relative: 26.6 % (ref 12.0–46.0)
Lymphs Abs: 2.5 10*3/uL (ref 0.7–4.0)
MCHC: 33.6 g/dL (ref 30.0–36.0)
MCV: 90.1 fl (ref 78.0–100.0)
Monocytes Absolute: 0.4 10*3/uL (ref 0.1–1.0)
Monocytes Relative: 4.6 % (ref 3.0–12.0)
Neutro Abs: 6.3 10*3/uL (ref 1.4–7.7)
Neutrophils Relative %: 66.7 % (ref 43.0–77.0)
Platelets: 349 10*3/uL (ref 150.0–400.0)
RBC: 4.1 Mil/uL (ref 3.87–5.11)
RDW: 13.3 % (ref 11.5–15.5)
WBC: 9.4 10*3/uL (ref 4.0–10.5)

## 2021-10-02 LAB — LIPID PANEL
Cholesterol: 196 mg/dL (ref 0–200)
HDL: 61.2 mg/dL (ref >39.00)
LDL Cholesterol: 117 mg/dL — ABNORMAL HIGH (ref 0–99)
NonHDL: 135
Total CHOL/HDL Ratio: 3
Triglycerides: 89 mg/dL (ref 0.0–149.0)
VLDL: 17.8 mg/dL (ref 0.0–40.0)

## 2021-10-02 LAB — URINALYSIS
Bilirubin Urine: NEGATIVE
Hgb urine dipstick: NEGATIVE
Ketones, ur: NEGATIVE
Leukocytes,Ua: NEGATIVE
Nitrite: NEGATIVE
Specific Gravity, Urine: 1.02 (ref 1.000–1.030)
Total Protein, Urine: NEGATIVE
Urine Glucose: NEGATIVE
Urobilinogen, UA: 0.2 (ref 0.0–1.0)
pH: 6.5 (ref 5.0–8.0)

## 2021-10-02 LAB — TSH: TSH: 0.36 u[IU]/mL (ref 0.35–5.50)

## 2021-10-02 MED ORDER — CIPROFLOXACIN HCL 500 MG PO TABS: 500.0000 mg | ORAL_TABLET | Freq: Two times a day (BID) | ORAL | 0 refills | Status: AC

## 2021-10-02 MED ORDER — METRONIDAZOLE 500 MG PO TABS: 500.0000 mg | ORAL_TABLET | Freq: Three times a day (TID) | ORAL | 0 refills | Status: DC

## 2021-10-02 MED ORDER — OXYCODONE-ACETAMINOPHEN 5-325 MG PO TABS: 1.0000 | ORAL_TABLET | Freq: Three times a day (TID) | ORAL | 0 refills | Status: DC | PRN

## 2021-10-02 MED ORDER — ATENOLOL 50 MG PO TABS: 50.0000 mg | ORAL_TABLET | Freq: Every day | ORAL | 3 refills | Status: DC

## 2021-10-02 NOTE — Assessment & Plan Note (Signed)
We discussed age appropriate health related issues, including available/recomended screening tests and vaccinations. Labs were ordered to be later reviewed . All questions were answered. We discussed one or more of the following - seat belt use, use of sunscreen/sun exposure exercise, safe sex, fall risk reduction, second hand smoke exposure, firearm use and storage, seat belt use, a need for adhering to healthy diet and exercise. ?Labs were ordered.  All questions were answered. ?Colon 2011, last 01/2021 - [facility] ?GYN q 2 years ?Derm - q 12 mo ?

## 2021-10-02 NOTE — Assessment & Plan Note (Addendum)
Flare up ?Cipro and Flagyl; probiotic ?Recurrent  ?Colon 2011, last 01/2021 [facility] ?Diet discussed ?

## 2021-10-02 NOTE — Assessment & Plan Note (Signed)
Intermittent ?Percocet - rare use.  Prescription provided. ? Potential benefits of a short or long term opioids use as well as potential risks (i.e. addiction risk, apnea etc) and complications (i.e. Somnolence, constipation and others) were explained to the patient and were aknowledged. ?

## 2021-10-02 NOTE — Progress Notes (Signed)
? ?Subjective:  ?Patient ID: Sheila Lin, female    DOB: [birth year]  Age: 61 y.o. MRN: [id number] ? ?CC: No chief complaint on file. ? ? ?HPI ?Sheila Lin presents for a well exam ?C/o LLQ abd pain x 4 days - h/o diverticulitis ?C/o LBP ? ?Outpatient Medications Prior to Visit  ?Medication Sig Dispense Refill  ? CALCIUM PO Take 800 mg by mouth daily.     ? Cholecalciferol (VITAMIN D3) 2000 units capsule Take 1 capsule (2,000 Units total) by mouth daily. 100 capsule 3  ? atenolol (TENORMIN) 50 MG tablet Take 1 tablet (50 mg total) by mouth daily. 90 tablet 3  ? cephALEXin (KEFLEX) 500 MG capsule Take 1 capsule (500 mg total) by mouth 3 (three) times daily. 12 capsule 0  ? LORazepam (ATIVAN) 0.5 MG tablet Take 1 tablet (0.5 mg total) by mouth 2 (two) times daily as needed for anxiety or sleep. 30 tablet 1  ? oxyCODONE-acetaminophen (PERCOCET/ROXICET) 5-325 MG tablet Take 1 tablet by mouth every 8 (eight) hours as needed for severe pain. 30 tablet 0  ? ?No facility-administered medications prior to visit.  ? ? ?ROS: ?Review of Systems  ?Constitutional:  Positive for chills. Negative for activity change, appetite change, fatigue and unexpected weight change.  ?HENT:  Negative for congestion, mouth sores and sinus pressure.   ?Eyes:  Negative for visual disturbance.  ?Respiratory:  Negative for cough and chest tightness.   ?Gastrointestinal:  Positive for abdominal pain. Negative for diarrhea and nausea.  ?Genitourinary:  Negative for difficulty urinating, frequency and vaginal pain.  ?Musculoskeletal:  Negative for back pain and gait problem.  ?Skin:  Negative for pallor and rash.  ?Neurological:  Negative for dizziness, tremors, weakness, numbness and headaches.  ?Psychiatric/Behavioral:  Negative for confusion and sleep disturbance.   ? ?Objective:  ?BP 118/72 (BP Location: Left Arm, Patient Position: Sitting, Cuff Size: Large)   Pulse 84   Temp 99.2 ?F (37.3 ?C) (Oral)   Ht '5\' 8"'$  (1.727 m)   Wt 182  lb (82.6 kg)   SpO2 97%   BMI 27.67 kg/m?  ? ?BP Readings from Last 3 Encounters:  ?10/02/21 118/72  ?04/12/21 130/72  ?02/01/21 128/80  ? ? ?Wt Readings from Last 3 Encounters:  ?10/02/21 182 lb (82.6 kg)  ?04/12/21 179 lb 6.4 oz (81.4 kg)  ?02/01/21 179 lb 3.2 oz (81.3 kg)  ? ? ?Physical Exam ?Constitutional:   ?   General: She is not in acute distress. ?   Appearance: Normal appearance. She is well-developed.  ?HENT:  ?   Head: Normocephalic.  ?   Right Ear: External ear normal.  ?   Left Ear: External ear normal.  ?   Nose: Nose normal.  ?Eyes:  ?   General:     ?   Right eye: No discharge.     ?   Left eye: No discharge.  ?   Conjunctiva/sclera: Conjunctivae normal.  ?   Pupils: Pupils are equal, round, and reactive to light.  ?Neck:  ?   Thyroid: No thyromegaly.  ?   Vascular: No JVD.  ?   Trachea: No tracheal deviation.  ?Cardiovascular:  ?   Rate and Rhythm: Normal rate and regular rhythm.  ?   Heart sounds: Normal heart sounds.  ?Pulmonary:  ?   Effort: No respiratory distress.  ?   Breath sounds: No stridor. No wheezing.  ?Abdominal:  ?   General: Bowel sounds are normal. There is no  distension.  ?   Palpations: Abdomen is soft. There is no mass.  ?   Tenderness: There is abdominal tenderness. There is no guarding or rebound.  ?Musculoskeletal:     ?   General: No tenderness.  ?   Cervical back: Normal range of motion and neck supple. No rigidity.  ?Lymphadenopathy:  ?   Cervical: No cervical adenopathy.  ?Skin: ?   Findings: No erythema or rash.  ?Neurological:  ?   Cranial Nerves: No cranial nerve deficit.  ?   Motor: No abnormal muscle tone.  ?   Coordination: Coordination normal.  ?   Deep Tendon Reflexes: Reflexes normal.  ?Psychiatric:     ?   Behavior: Behavior normal.     ?   Thought Content: Thought content normal.     ?   Judgment: Judgment normal.  ?LLQ is tender ? ? ?I spent 22 minutes in addition to time for CPX wellness examination in preparing to see the patient by review of recent labs,  imaging and procedures, obtaining and reviewing separately obtained history, communicating with the patient, ordering medications, tests or procedures, and documenting clinical information in the EHR including the differential diagnosis, treatment, and any further evaluation and other management of diverticulitis, LBP  ?  ?  ? ? ?Lab Results  ?Component Value Date  ? WBC 13.6 (H) 07/20/2020  ? HGB 12.5 07/20/2020  ? HCT 37.4 07/20/2020  ? PLT 398.0 07/20/2020  ? GLUCOSE 115 (H) 07/20/2020  ? CHOL 212 (H) 07/20/2020  ? TRIG 76.0 07/20/2020  ? HDL 59.20 07/20/2020  ? LDLDIRECT 156.2 03/15/2010  ? [place] 137 (H) 07/20/2020  ? ALT 16 07/20/2020  ? AST 18 07/20/2020  ? NA 140 07/20/2020  ? K 4.0 07/20/2020  ? CL 105 07/20/2020  ? CREATININE 0.57 07/20/2020  ? BUN 18 07/20/2020  ? CO2 28 07/20/2020  ? TSH 0.36 07/20/2020  ? ? ?MM DIAG BREAST TOMO BILATERAL ? ?Result Date: 04/12/2021 ?CLINICAL DATA:  61 year old female presenting for annual bilateral mammogram and delayed follow-up of a probably benign right breast mass. EXAM: DIGITAL DIAGNOSTIC BILATERAL MAMMOGRAM WITH TOMOSYNTHESIS AND CAD TECHNIQUE: Bilateral digital diagnostic mammography and breast tomosynthesis was performed. The images were evaluated with computer-aided detection. COMPARISON:  Previous exam(s). ACR Breast Density Category b: There are scattered areas of fibroglandular density. FINDINGS: Previously seen circumscribed mass in the medial right breast at mid depth has completely resolved in the interim. Otherwise, no new or suspicious findings in either breast. The parenchymal pattern is stable. IMPRESSION: No mammographic evidence of malignancy in either breast. RECOMMENDATION: Screening mammogram in one year.(Code:SM-B-01Y) I have discussed the findings and recommendations with the patient. If applicable, a reminder letter will be sent to the patient regarding the next appointment. BI-RADS CATEGORY  1: Negative. Electronically Signed   By: Kristopher Oppenheim M.D.   On: 04/12/2021 14:41 ? ? ?Assessment & Plan:  ? ?Problem List Items Addressed This Visit   ? ? Well adult exam - Primary  ?  We discussed age appropriate health related issues, including available/recomended screening tests and vaccinations. Labs were ordered to be later reviewed . All questions were answered. We discussed one or more of the following - seat belt use, use of sunscreen/sun exposure exercise, safe sex, fall risk reduction, second hand smoke exposure, firearm use and storage, seat belt use, a need for adhering to healthy diet and exercise. ?Labs were ordered.  All questions were answered. ?Colon 2011, last 01/2021 -  [facility] ?GYN q 2 years ?Derm - q 12 mo ?  ?  ? Relevant Orders  ? TSH  ? Urinalysis  ? CBC with Differential/Platelet  ? Lipid panel  ? Comprehensive metabolic panel  ? Low back pain  ?  Intermittent ?Percocet - rare use.  Prescription provided. ? Potential benefits of a short or long term opioids use as well as potential risks (i.e. addiction risk, apnea etc) and complications (i.e. Somnolence, constipation and others) were explained to the patient and were aknowledged. ?  ?  ? Relevant Medications  ? oxyCODONE-acetaminophen (PERCOCET/ROXICET) 5-325 MG tablet  ? Diverticulitis  ?  Flare up ?Cipro and Flagyl; probiotic ?Recurrent  ?Colon 2011, last 01/2021 [facility] ?Diet discussed ?  ?  ?  ? ? ?Meds ordered this encounter  ?Medications  ? atenolol (TENORMIN) 50 MG tablet  ?  Sig: Take 1 tablet (50 mg total) by mouth daily.  ?  Dispense:  90 tablet  ?  Refill:  3  ? ciprofloxacin (CIPRO) 500 MG tablet  ?  Sig: Take 1 tablet (500 mg total) by mouth 2 (two) times daily for 10 days.  ?  Dispense:  20 tablet  ?  Refill:  0  ? metroNIDAZOLE (FLAGYL) 500 MG tablet  ?  Sig: Take 1 tablet (500 mg total) by mouth 3 (three) times daily.  ?  Dispense:  21 tablet  ?  Refill:  0  ? oxyCODONE-acetaminophen (PERCOCET/ROXICET) 5-325 MG tablet  ?  Sig: Take 1 tablet by mouth every 8 (eight)  hours as needed for severe pain.  ?  Dispense:  30 tablet  ?  Refill:  0  ?  ? ? ?Follow-up: Return in about 6 months (around 04/03/2022) for a follow-up visit. ? ?Walker Kehr, MD ?

## 2021-10-09 ENCOUNTER — Telehealth: Payer: Self-pay | Admitting: *Deleted

## 2021-10-09 NOTE — Telephone Encounter (Signed)
Rec'd Pa for Oxycodone 5/325 mg.. completed on cover-my-meds w/  (Key: [id number]). Rec'd " stating Your information has been submitted and will be reviewed by [place]. You may close this dialog, return to your dashboard, and perform other tasks. An electronic determination will be received in CoverMyMeds within 72-120 hours."./../lmb ?

## 2021-10-10 NOTE — Telephone Encounter (Signed)
Check cover-my-meds PA still pending waiting on insurance determination..Chryl Heck ?

## 2021-10-11 NOTE — Telephone Encounter (Signed)
Rec'd additional questionare.. filled form out and faxed back to along w/ last ov to [facility] @ [phone]..Chryl Heck ?

## 2021-10-16 NOTE — Telephone Encounter (Signed)
Still haven't heard from insurance check cover-my-meds rec'd msf " Your information has been submitted to [facility] and is being reviewed. You may close this dialog, return to your dashboard, and perform other tasks. You will receive an electronic determination in CoverMyMeds within 72-120 hours.Marland KitchenJohny Lin ?

## 2021-10-19 NOTE — Telephone Encounter (Signed)
Finally received PA approval w/ effective Start Date:10/09/2021;Coverage End Date:10/13/2022. Faxed over to [place] [facility].Marland KitchenJohny Chess ?

## 2021-11-10 ENCOUNTER — Ambulatory Visit (INDEPENDENT_AMBULATORY_CARE_PROVIDER_SITE_OTHER): Payer: Commercial Managed Care - HMO | Admitting: Internal Medicine

## 2021-11-10 ENCOUNTER — Encounter: Payer: Self-pay | Admitting: Internal Medicine

## 2021-11-10 DIAGNOSIS — M67479 Ganglion, unspecified ankle and foot: Secondary | ICD-10-CM | POA: Diagnosis not present

## 2021-11-10 DIAGNOSIS — G8929 Other chronic pain: Secondary | ICD-10-CM

## 2021-11-10 DIAGNOSIS — M545 Low back pain, unspecified: Secondary | ICD-10-CM

## 2021-11-10 MED ORDER — OXYCODONE-ACETAMINOPHEN 5-325 MG PO TABS: 1.0000 | ORAL_TABLET | Freq: Three times a day (TID) | ORAL | 0 refills | Status: DC | PRN

## 2021-11-10 NOTE — Assessment & Plan Note (Signed)
Rx Percocet - rare use.  Prescription provided.  Potential benefits of a short or long term opioids use as well as potential risks (i.e. addiction risk, apnea etc) and complications (i.e. Somnolence, constipation and others) were explained to the patient and were aknowledged.

## 2021-11-10 NOTE — Assessment & Plan Note (Signed)
L foot Podiatry ref

## 2021-11-10 NOTE — Progress Notes (Signed)
Subjective:  Patient ID: Sheila Lin, female    DOB: [birth year]  Age: 61 y.o. MRN: [id number]  CC: Left and ankle Concerns (Pt has a knot to the top of her left foot and the side of ankle. Denies any pain or injury. )   HPI Sheila Lin presents for L foot cyst and L lat ankle swelling x 3 weeks. No pain. F/u on LBP  Outpatient Medications Prior to Visit  Medication Sig Dispense Refill   atenolol (TENORMIN) 50 MG tablet Take 1 tablet (50 mg total) by mouth daily. 90 tablet 3   CALCIUM PO Take 800 mg by mouth daily.      Cholecalciferol (VITAMIN D3) 2000 units capsule Take 1 capsule (2,000 Units total) by mouth daily. 100 capsule 3   metroNIDAZOLE (FLAGYL) 500 MG tablet Take 1 tablet (500 mg total) by mouth 3 (three) times daily. 21 tablet 0   oxyCODONE-acetaminophen (PERCOCET/ROXICET) 5-325 MG tablet Take 1 tablet by mouth every 8 (eight) hours as needed for severe pain. 30 tablet 0   No facility-administered medications prior to visit.    ROS: Review of Systems  Constitutional:  Negative for chills.  Musculoskeletal:  Positive for back pain. Negative for arthralgias and joint swelling.   Objective:  BP 124/78   Pulse 62   Resp 18   Ht '5\' 8"'$  (1.727 m)   Wt 183 lb 12.8 oz (83.4 kg)   SpO2 99%   BMI 27.95 kg/m   BP Readings from Last 3 Encounters:  11/10/21 124/78  10/02/21 118/72  04/12/21 130/72    Wt Readings from Last 3 Encounters:  11/10/21 183 lb 12.8 oz (83.4 kg)  10/02/21 182 lb (82.6 kg)  04/12/21 179 lb 6.4 oz (81.4 kg)    Physical Exam Constitutional:      General: She is not in acute distress.    Appearance: She is well-developed.  HENT:     Head: Normocephalic.     Right Ear: External ear normal.     Left Ear: External ear normal.     Nose: Nose normal.  Eyes:     General:        Right eye: No discharge.        Left eye: No discharge.     Conjunctiva/sclera: Conjunctivae normal.     Pupils: Pupils are equal, round, and reactive  to light.  Neck:     Thyroid: No thyromegaly.     Vascular: No JVD.     Trachea: No tracheal deviation.  Cardiovascular:     Rate and Rhythm: Normal rate and regular rhythm.     Heart sounds: Normal heart sounds.  Pulmonary:     Effort: No respiratory distress.     Breath sounds: No stridor. No wheezing.  Abdominal:     General: Bowel sounds are normal. There is no distension.     Palpations: Abdomen is soft. There is no mass.     Tenderness: There is no abdominal tenderness. There is no guarding or rebound.  Musculoskeletal:        General: No tenderness.     Cervical back: Normal range of motion and neck supple. No rigidity.  Lymphadenopathy:     Cervical: No cervical adenopathy.  Skin:    Findings: No erythema or rash.  Neurological:     Cranial Nerves: No cranial nerve deficit.     Motor: No abnormal muscle tone.     Coordination: Coordination normal.     Deep  Tendon Reflexes: Reflexes normal.  Psychiatric:        Behavior: Behavior normal.        Thought Content: Thought content normal.        Judgment: Judgment normal.   1 cm L dorsal foot cyst Lat ankle swelling, focal Lab Results  Component Value Date   WBC 9.4 10/02/2021   HGB 12.4 10/02/2021   HCT 37.0 10/02/2021   PLT 349.0 10/02/2021   GLUCOSE 110 (H) 10/02/2021   CHOL 196 10/02/2021   TRIG 89.0 10/02/2021   HDL 61.20 10/02/2021   LDLDIRECT 156.2 03/15/2010   LDLCALC 117 (H) 10/02/2021   ALT 13 10/02/2021   AST 16 10/02/2021   NA 139 10/02/2021   K 4.0 10/02/2021   CL 103 10/02/2021   CREATININE 0.57 10/02/2021   BUN 17 10/02/2021   CO2 28 10/02/2021   TSH 0.36 10/02/2021    MM DIAG BREAST TOMO BILATERAL  Result Date: 04/12/2021 CLINICAL DATA:  61 year old female presenting for annual bilateral mammogram and delayed follow-up of a probably benign right breast mass. EXAM: DIGITAL DIAGNOSTIC BILATERAL MAMMOGRAM WITH TOMOSYNTHESIS AND CAD TECHNIQUE: Bilateral digital diagnostic mammography and  breast tomosynthesis was performed. The images were evaluated with computer-aided detection. COMPARISON:  Previous exam(s). ACR Breast Density Category b: There are scattered areas of fibroglandular density. FINDINGS: Previously seen circumscribed mass in the medial right breast at mid depth has completely resolved in the interim. Otherwise, no new or suspicious findings in either breast. The parenchymal pattern is stable. IMPRESSION: No mammographic evidence of malignancy in either breast. RECOMMENDATION: Screening mammogram in one year.(Code:SM-B-01Y) I have discussed the findings and recommendations with the patient. If applicable, a reminder letter will be sent to the patient regarding the next appointment. BI-RADS CATEGORY  1: Negative. Electronically Signed   By: Kristopher Oppenheim M.D.   On: 04/12/2021 14:41   Assessment & Plan:   Problem List Items Addressed This Visit     Low back pain    Rx Percocet - rare use.  Prescription provided.  Potential benefits of a short or long term opioids use as well as potential risks (i.e. addiction risk, apnea etc) and complications (i.e. Somnolence, constipation and others) were explained to the patient and were aknowledged.       Ganglion cyst of foot     L foot Podiatry ref          No orders of the defined types were placed in this encounter.     Follow-up: No follow-ups on file.  Walker Kehr, MD

## 2021-11-28 ENCOUNTER — Encounter: Payer: Self-pay | Admitting: Podiatry

## 2021-11-28 ENCOUNTER — Ambulatory Visit: Payer: Commercial Managed Care - HMO | Admitting: Podiatry

## 2021-11-28 DIAGNOSIS — M67472 Ganglion, left ankle and foot: Secondary | ICD-10-CM

## 2021-11-28 NOTE — Progress Notes (Signed)
  Subjective:  Patient ID: Sheila Lin, female    DOB: [birth year],   MRN: [id number]  Chief Complaint  Patient presents with   Cyst    Cyst of left top and lateral foot x 1 month. Pt states cyst came all of a sudden, non painful, no drainage or opening.     61 y.o. female presents for on the top of her left foot that has been there for about 1 month relates it cam sudenly. Denies any pain and denies any opening or drianage. Relates she just wants to make sure it is not anything bad . Denies any other pedal complaints. Denies n/v/f/c.   Past Medical History:  Diagnosis Date   History of bulimia    LBP (low back pain)    Palpitations 2011   [facility]    Objective:  Physical Exam: Vascular: DP/PT pulses 2/4 bilateral. CFT <3 seconds. Normal hair growth on digits. No edema.  Skin. No lacerations or abrasions bilateral feet. Fluctuant non mobil mass noted to dorsum of third metatarsal on the left.  Musculoskeletal: MMT 5/5 bilateral lower extremities in DF, PF, Inversion and Eversion. Deceased ROM in DF of ankle joint.  Neurological: Sensation intact to light touch.   Assessment:   1. Ganglion cyst of left foot      Plan:  Patient was evaluated and treated and all questions answered. X-rays reviewed and discussed with patient. Discussed ganglion cysts and treatment options with the patient. Patient defers aspiration at this time and would just like to keep an eye on the area.  Patient to follow-up as needed if any changes .     Lorenda Peck, DPM

## 2021-12-18 ENCOUNTER — Encounter: Payer: Self-pay | Admitting: Internal Medicine

## 2021-12-18 ENCOUNTER — Ambulatory Visit (INDEPENDENT_AMBULATORY_CARE_PROVIDER_SITE_OTHER): Payer: Commercial Managed Care - HMO | Admitting: Internal Medicine

## 2021-12-18 DIAGNOSIS — G8929 Other chronic pain: Secondary | ICD-10-CM

## 2021-12-18 DIAGNOSIS — M545 Low back pain, unspecified: Secondary | ICD-10-CM | POA: Diagnosis not present

## 2021-12-18 DIAGNOSIS — R002 Palpitations: Secondary | ICD-10-CM | POA: Diagnosis not present

## 2021-12-18 MED ORDER — OXYCODONE-ACETAMINOPHEN 5-325 MG PO TABS
1.0000 | ORAL_TABLET | Freq: Three times a day (TID) | ORAL | 0 refills | Status: DC | PRN
Start: 2021-12-18 — End: 2022-03-14

## 2021-12-18 MED ORDER — ATENOLOL 50 MG PO TABS: 50.0000 mg | ORAL_TABLET | Freq: Every day | ORAL | 3 refills | Status: DC

## 2022-02-05 ENCOUNTER — Other Ambulatory Visit: Payer: Self-pay | Admitting: Internal Medicine

## 2022-03-14 ENCOUNTER — Encounter: Payer: Self-pay | Admitting: Internal Medicine

## 2022-03-14 ENCOUNTER — Ambulatory Visit (INDEPENDENT_AMBULATORY_CARE_PROVIDER_SITE_OTHER): Payer: Commercial Managed Care - HMO | Admitting: Internal Medicine

## 2022-03-14 DIAGNOSIS — R002 Palpitations: Secondary | ICD-10-CM

## 2022-03-14 DIAGNOSIS — Z23 Encounter for immunization: Secondary | ICD-10-CM

## 2022-03-14 DIAGNOSIS — G8929 Other chronic pain: Secondary | ICD-10-CM

## 2022-03-14 DIAGNOSIS — R102 Pelvic and perineal pain: Secondary | ICD-10-CM | POA: Diagnosis not present

## 2022-03-14 DIAGNOSIS — M545 Low back pain, unspecified: Secondary | ICD-10-CM

## 2022-03-14 DIAGNOSIS — E559 Vitamin D deficiency, unspecified: Secondary | ICD-10-CM | POA: Diagnosis not present

## 2022-03-14 MED ORDER — OXYCODONE-ACETAMINOPHEN 5-325 MG PO TABS
1.0000 | ORAL_TABLET | Freq: Three times a day (TID) | ORAL | 0 refills | Status: DC | PRN
Start: 2022-03-14 — End: 2022-05-22

## 2022-03-14 NOTE — Assessment & Plan Note (Signed)
?  etiology: per pt -  benign calcified tumors per Dr Willis Modena office

## 2022-03-14 NOTE — Assessment & Plan Note (Signed)
Chronic On Atenolol

## 2022-03-14 NOTE — Assessment & Plan Note (Signed)
On Vit D 

## 2022-03-14 NOTE — Assessment & Plan Note (Signed)
Percocet - rare use.  Prescription provided.  Potential benefits of a short or long term opioids use as well as potential risks (i.e. addiction risk, apnea etc) and complications (i.e. Somnolence, constipation and others) were explained to the patient and were aknowledged.

## 2022-03-14 NOTE — Progress Notes (Signed)
Subjective:  Patient ID: Sheila Lin, female    DOB: 04/09/1961  Age: 61 y.o. MRN: [id number]  CC: Medication Refill (NEED REFILL ON OXYCODONE- Flu shot)   HPI Sheila Lin presents for LBP, palpitations, fibromas dx'd by GYN vs other  Outpatient Medications Prior to Visit  Medication Sig Dispense Refill   atenolol (TENORMIN) 50 MG tablet Take 1 tablet (50 mg total) by mouth daily. 90 tablet 3   CALCIUM PO Take 800 mg by mouth daily.      Cholecalciferol (VITAMIN D3) 2000 units capsule Take 1 capsule (2,000 Units total) by mouth daily. 100 capsule 3   oxyCODONE-acetaminophen (PERCOCET/ROXICET) 5-325 MG tablet Take 1 tablet by mouth every 8 (eight) hours as needed for severe pain. 90 tablet 0   No facility-administered medications prior to visit.    ROS: Review of Systems  Constitutional:  Negative for activity change, appetite change, chills, fatigue and unexpected weight change.  HENT:  Negative for congestion, mouth sores and sinus pressure.   Eyes:  Negative for visual disturbance.  Respiratory:  Negative for cough and chest tightness.   Gastrointestinal:  Negative for abdominal pain and nausea.  Genitourinary:  Positive for pelvic pain. Negative for difficulty urinating, frequency and vaginal pain.  Musculoskeletal:  Positive for back pain. Negative for gait problem.  Skin:  Negative for pallor and rash.  Neurological:  Negative for dizziness, tremors, weakness, numbness and headaches.  Psychiatric/Behavioral:  Negative for confusion and sleep disturbance. The patient is not nervous/anxious.     Objective:  BP 118/78 (BP Location: Left Arm)   Pulse (!) 102   Temp 99.3 F (37.4 C) (Oral)   Ht '5\' 8"'$  (1.727 m)   Wt 181 lb 12.8 oz (82.5 kg)   SpO2 97%   BMI 27.64 kg/m   BP Readings from Last 3 Encounters:  03/14/22 118/78  12/18/21 140/80  11/10/21 124/78    Wt Readings from Last 3 Encounters:  03/14/22 181 lb 12.8 oz (82.5 kg)  12/18/21 180 lb (81.6  kg)  11/10/21 183 lb 12.8 oz (83.4 kg)    Physical Exam Constitutional:      General: She is not in acute distress.    Appearance: Normal appearance. She is well-developed.  HENT:     Head: Normocephalic.     Right Ear: External ear normal.     Left Ear: External ear normal.     Nose: Nose normal.  Eyes:     General:        Right eye: No discharge.        Left eye: No discharge.     Conjunctiva/sclera: Conjunctivae normal.     Pupils: Pupils are equal, round, and reactive to light.  Neck:     Thyroid: No thyromegaly.     Vascular: No JVD.     Trachea: No tracheal deviation.  Cardiovascular:     Rate and Rhythm: Normal rate and regular rhythm.     Heart sounds: Normal heart sounds.  Pulmonary:     Effort: No respiratory distress.     Breath sounds: No stridor. No wheezing.  Abdominal:     General: Bowel sounds are normal. There is no distension.     Palpations: Abdomen is soft. There is no mass.     Tenderness: There is no abdominal tenderness. There is no guarding or rebound.  Musculoskeletal:        General: No tenderness.     Cervical back: Normal range of motion  and neck supple. No rigidity.  Lymphadenopathy:     Cervical: No cervical adenopathy.  Skin:    Findings: No erythema or rash.  Neurological:     Cranial Nerves: No cranial nerve deficit.     Motor: No abnormal muscle tone.     Coordination: Coordination normal.     Deep Tendon Reflexes: Reflexes normal.  Psychiatric:        Behavior: Behavior normal.        Thought Content: Thought content normal.        Judgment: Judgment normal.   LS w/pain  Lab Results  Component Value Date   WBC 9.4 10/02/2021   HGB 12.4 10/02/2021   HCT 37.0 10/02/2021   PLT 349.0 10/02/2021   GLUCOSE 110 (H) 10/02/2021   CHOL 196 10/02/2021   TRIG 89.0 10/02/2021   HDL 61.20 10/02/2021   LDLDIRECT 156.2 03/15/2010   LDLCALC 117 (H) 10/02/2021   ALT 13 10/02/2021   AST 16 10/02/2021   NA 139 10/02/2021   K 4.0  10/02/2021   CL 103 10/02/2021   CREATININE 0.57 10/02/2021   BUN 17 10/02/2021   CO2 28 10/02/2021   TSH 0.36 10/02/2021    MM DIAG BREAST TOMO BILATERAL  Result Date: 04/12/2021 CLINICAL DATA:  61 year old female presenting for annual bilateral mammogram and delayed follow-up of a probably benign right breast mass. EXAM: DIGITAL DIAGNOSTIC BILATERAL MAMMOGRAM WITH TOMOSYNTHESIS AND CAD TECHNIQUE: Bilateral digital diagnostic mammography and breast tomosynthesis was performed. The images were evaluated with computer-aided detection. COMPARISON:  Previous exam(s). ACR Breast Density Category b: There are scattered areas of fibroglandular density. FINDINGS: Previously seen circumscribed mass in the medial right breast at mid depth has completely resolved in the interim. Otherwise, no new or suspicious findings in either breast. The parenchymal pattern is stable. IMPRESSION: No mammographic evidence of malignancy in either breast. RECOMMENDATION: Screening mammogram in one year.(Code:SM-B-01Y) I have discussed the findings and recommendations with the patient. If applicable, a reminder letter will be sent to the patient regarding the next appointment. BI-RADS CATEGORY  1: Negative. Electronically Signed   By: Sheila Lin M.D.   On: 04/12/2021 14:41   Assessment & Plan:   Problem List Items Addressed This Visit     Low back pain    Percocet - rare use.  Prescription provided.  Potential benefits of a short or long term opioids use as well as potential risks (i.e. addiction risk, apnea etc) and complications (i.e. Somnolence, constipation and others) were explained to the patient and were aknowledged.      Relevant Medications   oxyCODONE-acetaminophen (PERCOCET/ROXICET) 5-325 MG tablet   Palpitations    Chronic On Atenolol      Pelvic pain    ?etiology: per pt -  benign calcified tumors per Dr Sheila Lin office      Vitamin D deficiency    On Vit D         Meds ordered this  encounter  Medications   oxyCODONE-acetaminophen (PERCOCET/ROXICET) 5-325 MG tablet    Sig: Take 1 tablet by mouth every 8 (eight) hours as needed for severe pain.    Dispense:  90 tablet    Refill:  0    Dx LBP      Follow-up: Return in about 3 months (around 06/13/2022) for a follow-up visit.  Walker Kehr, MD

## 2022-05-22 ENCOUNTER — Encounter: Payer: Self-pay | Admitting: Internal Medicine

## 2022-05-22 ENCOUNTER — Ambulatory Visit (INDEPENDENT_AMBULATORY_CARE_PROVIDER_SITE_OTHER): Payer: Commercial Managed Care - HMO | Admitting: Internal Medicine

## 2022-05-22 VITALS — BP 120/78 | HR 75 | Temp 98.2°F | Ht 68.0 in | Wt 174.0 lb

## 2022-05-22 DIAGNOSIS — G8929 Other chronic pain: Secondary | ICD-10-CM

## 2022-05-22 DIAGNOSIS — M545 Low back pain, unspecified: Secondary | ICD-10-CM

## 2022-05-22 MED ORDER — OXYCODONE-ACETAMINOPHEN 5-325 MG PO TABS: 1.0000 | ORAL_TABLET | Freq: Three times a day (TID) | ORAL | 0 refills | Status: DC | PRN

## 2022-05-22 NOTE — Assessment & Plan Note (Signed)
Percocet - rare use.  Prescription provided.  Potential benefits of a short or long term opioids use as well as potential risks (i.e. addiction risk, apnea etc) and complications (i.e. Somnolence, constipation and others) were explained to the patient and were aknowledged. Start yoga

## 2022-05-22 NOTE — Progress Notes (Signed)
Subjective:  Patient ID: Sheila Lin, female    DOB: [birth year]  Age: 61 y.o. MRN: [id number]  CC: Medication Refill   HPI Sheila Lin presents for LBP. Dad is getting a caregiver full time.  Outpatient Medications Prior to Visit  Medication Sig Dispense Refill   atenolol (TENORMIN) 50 MG tablet Take 1 tablet (50 mg total) by mouth daily. 90 tablet 3   CALCIUM PO Take 800 mg by mouth daily.      Cholecalciferol (VITAMIN D3) 2000 units capsule Take 1 capsule (2,000 Units total) by mouth daily. 100 capsule 3   oxyCODONE-acetaminophen (PERCOCET/ROXICET) 5-325 MG tablet Take 1 tablet by mouth every 8 (eight) hours as needed for severe pain. 90 tablet 0   No facility-administered medications prior to visit.    ROS: Review of Systems  Constitutional:  Negative for activity change, appetite change, chills, fatigue and unexpected weight change.  HENT:  Negative for congestion, mouth sores and sinus pressure.   Eyes:  Negative for visual disturbance.  Respiratory:  Negative for cough and chest tightness.   Gastrointestinal:  Negative for abdominal pain and nausea.  Genitourinary:  Negative for difficulty urinating, frequency and vaginal pain.  Musculoskeletal:  Positive for back pain and gait problem.  Skin:  Negative for pallor and rash.  Neurological:  Negative for dizziness, tremors, weakness, numbness and headaches.  Psychiatric/Behavioral:  Negative for confusion, sleep disturbance and suicidal ideas. The patient is not nervous/anxious.     Objective:  BP 120/78 (BP Location: Left Arm, Patient Position: Sitting, Cuff Size: Normal)   Pulse 75   Temp 98.2 F (36.8 C) (Oral)   Ht '5\' 8"'$  (1.727 m)   Wt 174 lb (78.9 kg)   SpO2 98%   BMI 26.46 kg/m   BP Readings from Last 3 Encounters:  05/22/22 120/78  03/14/22 118/78  12/18/21 140/80    Wt Readings from Last 3 Encounters:  05/22/22 174 lb (78.9 kg)  03/14/22 181 lb 12.8 oz (82.5 kg)  12/18/21 180 lb (81.6  kg)    Physical Exam Constitutional:      General: She is not in acute distress.    Appearance: She is well-developed. She is obese.  HENT:     Head: Normocephalic.     Right Ear: External ear normal.     Left Ear: External ear normal.     Nose: Nose normal.  Eyes:     General:        Right eye: No discharge.        Left eye: No discharge.     Conjunctiva/sclera: Conjunctivae normal.     Pupils: Pupils are equal, round, and reactive to light.  Neck:     Thyroid: No thyromegaly.     Vascular: No JVD.     Trachea: No tracheal deviation.  Cardiovascular:     Rate and Rhythm: Normal rate and regular rhythm.     Heart sounds: Normal heart sounds.  Pulmonary:     Effort: No respiratory distress.     Breath sounds: No stridor. No wheezing.  Abdominal:     General: Bowel sounds are normal. There is no distension.     Palpations: Abdomen is soft. There is no mass.     Tenderness: There is no abdominal tenderness. There is no guarding or rebound.  Musculoskeletal:        General: Tenderness present.     Cervical back: Normal range of motion and neck supple. No rigidity.  Lymphadenopathy:  Cervical: No cervical adenopathy.  Skin:    Findings: No erythema or rash.  Neurological:     Cranial Nerves: No cranial nerve deficit.     Motor: No abnormal muscle tone.     Coordination: Coordination normal.     Deep Tendon Reflexes: Reflexes normal.  Psychiatric:        Behavior: Behavior normal.        Thought Content: Thought content normal.        Judgment: Judgment normal.   LS w/pain  Lab Results  Component Value Date   WBC 9.4 10/02/2021   HGB 12.4 10/02/2021   HCT 37.0 10/02/2021   PLT 349.0 10/02/2021   GLUCOSE 110 (H) 10/02/2021   CHOL 196 10/02/2021   TRIG 89.0 10/02/2021   HDL 61.20 10/02/2021   LDLDIRECT 156.2 03/15/2010   LDLCALC 117 (H) 10/02/2021   ALT 13 10/02/2021   AST 16 10/02/2021   NA 139 10/02/2021   K 4.0 10/02/2021   CL 103 10/02/2021    CREATININE 0.57 10/02/2021   BUN 17 10/02/2021   CO2 28 10/02/2021   TSH 0.36 10/02/2021    MM DIAG BREAST TOMO BILATERAL  Result Date: 04/12/2021 CLINICAL DATA:  61 year old female presenting for annual bilateral mammogram and delayed follow-up of a probably benign right breast mass. EXAM: DIGITAL DIAGNOSTIC BILATERAL MAMMOGRAM WITH TOMOSYNTHESIS AND CAD TECHNIQUE: Bilateral digital diagnostic mammography and breast tomosynthesis was performed. The images were evaluated with computer-aided detection. COMPARISON:  Previous exam(s). ACR Breast Density Category b: There are scattered areas of fibroglandular density. FINDINGS: Previously seen circumscribed mass in the medial right breast at mid depth has completely resolved in the interim. Otherwise, no new or suspicious findings in either breast. The parenchymal pattern is stable. IMPRESSION: No mammographic evidence of malignancy in either breast. RECOMMENDATION: Screening mammogram in one year.(Code:SM-B-01Y) I have discussed the findings and recommendations with the patient. If applicable, a reminder letter will be sent to the patient regarding the next appointment. BI-RADS CATEGORY  1: Negative. Electronically Signed   By: Kristopher Oppenheim M.D.   On: 04/12/2021 14:41   Assessment & Plan:   Problem List Items Addressed This Visit     Low back pain - Primary    Percocet - rare use.  Prescription provided.  Potential benefits of a short or long term opioids use as well as potential risks (i.e. addiction risk, apnea etc) and complications (i.e. Somnolence, constipation and others) were explained to the patient and were aknowledged. Start yoga      Relevant Medications   oxyCODONE-acetaminophen (PERCOCET/ROXICET) 5-325 MG tablet      Meds ordered this encounter  Medications   oxyCODONE-acetaminophen (PERCOCET/ROXICET) 5-325 MG tablet    Sig: Take 1 tablet by mouth every 8 (eight) hours as needed for severe pain.    Dispense:  90 tablet     Refill:  0    Dx LBP      Follow-up: Return in about 3 months (around 08/22/2022).  Walker Kehr, MD

## 2022-06-05 ENCOUNTER — Ambulatory Visit: Payer: Commercial Managed Care - HMO | Admitting: Internal Medicine

## 2022-10-22 ENCOUNTER — Other Ambulatory Visit (HOSPITAL_COMMUNITY): Payer: Self-pay

## 2022-10-22 ENCOUNTER — Encounter: Payer: Self-pay | Admitting: Internal Medicine

## 2022-10-22 ENCOUNTER — Ambulatory Visit (INDEPENDENT_AMBULATORY_CARE_PROVIDER_SITE_OTHER): Payer: Commercial Managed Care - HMO | Admitting: Internal Medicine

## 2022-10-22 VITALS — BP 122/78 | HR 88 | Temp 98.3°F | Ht 68.0 in | Wt 181.0 lb

## 2022-10-22 DIAGNOSIS — S61501A Unspecified open wound of right wrist, initial encounter: Secondary | ICD-10-CM

## 2022-10-22 DIAGNOSIS — E559 Vitamin D deficiency, unspecified: Secondary | ICD-10-CM

## 2022-10-22 DIAGNOSIS — Z23 Encounter for immunization: Secondary | ICD-10-CM | POA: Diagnosis not present

## 2022-10-22 DIAGNOSIS — G8929 Other chronic pain: Secondary | ICD-10-CM

## 2022-10-22 DIAGNOSIS — Z0000 Encounter for general adult medical examination without abnormal findings: Secondary | ICD-10-CM

## 2022-10-22 DIAGNOSIS — M545 Low back pain, unspecified: Secondary | ICD-10-CM

## 2022-10-22 LAB — LIPID PANEL
Cholesterol: 186 mg/dL (ref 0–200)
HDL: 61.1 mg/dL (ref >39.00)
LDL Cholesterol: 110 mg/dL — ABNORMAL HIGH (ref 0–99)
NonHDL: 124.85
Total CHOL/HDL Ratio: 3
Triglycerides: 75 mg/dL (ref 0.0–149.0)
VLDL: 15 mg/dL (ref 0.0–40.0)

## 2022-10-22 LAB — CBC WITH DIFFERENTIAL/PLATELET
Basophils Absolute: 0.1 10*3/uL (ref 0.0–0.1)
Basophils Relative: 1 % (ref 0.0–3.0)
Eosinophils Absolute: 0.1 10*3/uL (ref 0.0–0.7)
Eosinophils Relative: 1 % (ref 0.0–5.0)
HCT: 38.8 % (ref 36.0–46.0)
Hemoglobin: 12.8 g/dL (ref 12.0–15.0)
Lymphocytes Relative: 28.4 % (ref 12.0–46.0)
Lymphs Abs: 3.3 10*3/uL (ref 0.7–4.0)
MCHC: 33.1 g/dL (ref 30.0–36.0)
MCV: 88.7 fl (ref 78.0–100.0)
Monocytes Absolute: 0.8 10*3/uL (ref 0.1–1.0)
Monocytes Relative: 6.9 % (ref 3.0–12.0)
Neutro Abs: 7.3 10*3/uL (ref 1.4–7.7)
Neutrophils Relative %: 62.7 % (ref 43.0–77.0)
Platelets: 441 10*3/uL — ABNORMAL HIGH (ref 150.0–400.0)
RBC: 4.37 Mil/uL (ref 3.87–5.11)
RDW: 13 % (ref 11.5–15.5)
WBC: 11.7 10*3/uL — ABNORMAL HIGH (ref 4.0–10.5)

## 2022-10-22 LAB — COMPREHENSIVE METABOLIC PANEL
ALT: 14 U/L (ref 0–35)
AST: 18 U/L (ref 0–37)
Albumin: 4.6 g/dL (ref 3.5–5.2)
Alkaline Phosphatase: 88 U/L (ref 39–117)
BUN: 15 mg/dL (ref 6–23)
CO2: 27 mEq/L (ref 19–32)
Calcium: 10.6 mg/dL — ABNORMAL HIGH (ref 8.4–10.5)
Chloride: 101 mEq/L (ref 96–112)
Creatinine, Ser: 0.68 mg/dL (ref 0.40–1.20)
GFR: 92.77 mL/min (ref >60.00)
Glucose, Bld: 103 mg/dL — ABNORMAL HIGH (ref 70–99)
Potassium: 4.6 mEq/L (ref 3.5–5.1)
Sodium: 138 mEq/L (ref 135–145)
Total Bilirubin: 0.5 mg/dL (ref 0.2–1.2)
Total Protein: 7.7 g/dL (ref 6.0–8.3)

## 2022-10-22 LAB — URINALYSIS
Bilirubin Urine: NEGATIVE
Hgb urine dipstick: NEGATIVE
Ketones, ur: NEGATIVE
Leukocytes,Ua: NEGATIVE
Nitrite: NEGATIVE
Specific Gravity, Urine: 1.015 (ref 1.000–1.030)
Total Protein, Urine: NEGATIVE
Urine Glucose: NEGATIVE
Urobilinogen, UA: 0.2 (ref 0.0–1.0)
pH: 7 (ref 5.0–8.0)

## 2022-10-22 LAB — TSH: TSH: 1.01 u[IU]/mL (ref 0.35–5.50)

## 2022-10-22 MED ORDER — OXYCODONE-ACETAMINOPHEN 5-325 MG PO TABS
1.0000 | ORAL_TABLET | Freq: Three times a day (TID) | ORAL | 0 refills | Status: DC | PRN
  Filled 2022-10-22: qty 90, 30d supply, fill #0

## 2022-10-22 MED ORDER — ATENOLOL 50 MG PO TABS
50.0000 mg | ORAL_TABLET | Freq: Every day | ORAL | 3 refills | Status: DC
  Filled 2022-10-22 – 2022-12-24 (×4): qty 90, 90d supply, fill #0

## 2022-10-22 NOTE — Assessment & Plan Note (Signed)
On Vit D 

## 2022-10-22 NOTE — Assessment & Plan Note (Signed)
Percocet - rare use.  Prescription provided.  Potential benefits of a short or long term opioids use as well as potential risks (i.e. addiction risk, apnea etc) and complications (i.e. Somnolence, constipation and others) were explained to the patient and were aknowledged. Start yoga 

## 2022-10-22 NOTE — Progress Notes (Signed)
Subjective:  Patient ID: Sheila Lin, female    DOB: 09/19/1960  Age: 62 y.o. MRN: [id number]  CC: Annual Exam (Physical, Refill for oxyCODONE-acetaminophen, atenolol (TENORMIN) 50 MG tablet is needed)   HPI Sheila Lin presents for LBP - taking care of her dad again  Well exam  Outpatient Medications Prior to Visit  Medication Sig Dispense Refill   CALCIUM PO Take 800 mg by mouth daily.      Cholecalciferol (VITAMIN D3) 2000 units capsule Take 1 capsule (2,000 Units total) by mouth daily. 100 capsule 3   atenolol (TENORMIN) 50 MG tablet Take 1 tablet (50 mg total) by mouth daily. 90 tablet 3   oxyCODONE-acetaminophen (PERCOCET/ROXICET) 5-325 MG tablet Take 1 tablet by mouth every 8 (eight) hours as needed for severe pain. 90 tablet 0   No facility-administered medications prior to visit.    ROS: Review of Systems  Constitutional:  Negative for activity change, appetite change, chills, fatigue and unexpected weight change.  HENT:  Negative for congestion, mouth sores and sinus pressure.   Eyes:  Negative for visual disturbance.  Respiratory:  Negative for cough and chest tightness.   Gastrointestinal:  Negative for abdominal pain and nausea.  Genitourinary:  Negative for difficulty urinating, frequency and vaginal pain.  Musculoskeletal:  Positive for back pain. Negative for gait problem.  Skin:  Negative for pallor and rash.  Neurological:  Negative for dizziness, tremors, weakness, numbness and headaches.  Psychiatric/Behavioral:  Negative for confusion and sleep disturbance.     Objective:  BP 122/78 (BP Location: Left Arm, Patient Position: Sitting, Cuff Size: Large)   Pulse 88   Temp 98.3 F (36.8 C) (Oral)   Ht 5\' 8"  (1.727 m)   Wt 181 lb (82.1 kg)   SpO2 98%   BMI 27.52 kg/m   BP Readings from Last 3 Encounters:  10/22/22 122/78  05/22/22 120/78  03/14/22 118/78    Wt Readings from Last 3 Encounters:  10/22/22 181 lb (82.1 kg)  05/22/22 174  lb (78.9 kg)  03/14/22 181 lb 12.8 oz (82.5 kg)    Physical Exam Constitutional:      General: She is not in acute distress.    Appearance: She is well-developed. She is obese.  HENT:     Head: Normocephalic.     Right Ear: External ear normal.     Left Ear: External ear normal.     Nose: Nose normal.  Eyes:     General:        Right eye: No discharge.        Left eye: No discharge.     Conjunctiva/sclera: Conjunctivae normal.     Pupils: Pupils are equal, round, and reactive to light.  Neck:     Thyroid: No thyromegaly.     Vascular: No JVD.     Trachea: No tracheal deviation.  Cardiovascular:     Rate and Rhythm: Normal rate and regular rhythm.     Heart sounds: Normal heart sounds.  Pulmonary:     Effort: No respiratory distress.     Breath sounds: No stridor. No wheezing.  Abdominal:     General: Bowel sounds are normal. There is no distension.     Palpations: Abdomen is soft. There is no mass.     Tenderness: There is no abdominal tenderness. There is no guarding or rebound.  Musculoskeletal:        General: Tenderness present.     Cervical back: Normal range of  motion and neck supple. No rigidity.     Right lower leg: No edema.     Left lower leg: No edema.  Lymphadenopathy:     Cervical: No cervical adenopathy.  Skin:    Findings: No erythema or rash.  Neurological:     Mental Status: She is oriented to person, place, and time.     Cranial Nerves: No cranial nerve deficit.     Motor: No abnormal muscle tone.     Coordination: Coordination normal.     Deep Tendon Reflexes: Reflexes normal.  Psychiatric:        Behavior: Behavior normal.        Thought Content: Thought content normal.        Judgment: Judgment normal.   Wounds R wrist - dressed  Lab Results  Component Value Date   WBC 9.4 10/02/2021   HGB 12.4 10/02/2021   HCT 37.0 10/02/2021   PLT 349.0 10/02/2021   GLUCOSE 110 (H) 10/02/2021   CHOL 196 10/02/2021   TRIG 89.0 10/02/2021   HDL  61.20 10/02/2021   LDLDIRECT 156.2 03/15/2010   LDLCALC 117 (H) 10/02/2021   ALT 13 10/02/2021   AST 16 10/02/2021   NA 139 10/02/2021   K 4.0 10/02/2021   CL 103 10/02/2021   CREATININE 0.57 10/02/2021   BUN 17 10/02/2021   CO2 28 10/02/2021   TSH 0.36 10/02/2021    MM DIAG BREAST TOMO BILATERAL  Result Date: 04/12/2021 CLINICAL DATA:  62 year old female presenting for annual bilateral mammogram and delayed follow-up of a probably benign right breast mass. EXAM: DIGITAL DIAGNOSTIC BILATERAL MAMMOGRAM WITH TOMOSYNTHESIS AND CAD TECHNIQUE: Bilateral digital diagnostic mammography and breast tomosynthesis was performed. The images were evaluated with computer-aided detection. COMPARISON:  Previous exam(s). ACR Breast Density Category b: There are scattered areas of fibroglandular density. FINDINGS: Previously seen circumscribed mass in the medial right breast at mid depth has completely resolved in the interim. Otherwise, no new or suspicious findings in either breast. The parenchymal pattern is stable. IMPRESSION: No mammographic evidence of malignancy in either breast. RECOMMENDATION: Screening mammogram in one year.(Code:SM-B-01Y) I have discussed the findings and recommendations with the patient. If applicable, a reminder letter will be sent to the patient regarding the next appointment. BI-RADS CATEGORY  1: Negative. Electronically Signed   By: Sande Brothers M.D.   On: 04/12/2021 14:41   Assessment & Plan:   Problem List Items Addressed This Visit     Well adult exam - Primary    We discussed age appropriate health related issues, including available/recomended screening tests and vaccinations. Labs were ordered to be later reviewed . All questions were answered. We discussed one or more of the following - seat belt use, use of sunscreen/sun exposure exercise, safe sex, fall risk reduction, second hand smoke exposure, firearm use and storage, seat belt use, a need for adhering to healthy  diet and exercise. Labs were ordered.  All questions were answered. Colon 2011, last 01/2021 - [facility] q 2 years Derm - q 12 mo tDAP 2024      Relevant Orders   TSH   Urinalysis   CBC with Differential/Platelet   Lipid panel   Comprehensive metabolic panel   Vitamin D deficiency    On Vit D      Low back pain    Percocet - rare use.  Prescription provided.  Potential benefits of a short or long term opioids use as well as potential risks (i.e. addiction  risk, apnea etc) and complications (i.e. Somnolence, constipation and others) were explained to the patient and were aknowledged. Start yoga      Relevant Medications   oxyCODONE-acetaminophen (PERCOCET/ROXICET) 5-325 MG tablet   Other Visit Diagnoses     Open wound of right wrist, initial encounter       Relevant Orders   Tdap vaccine greater than or equal to 7yo IM (Completed)         Meds ordered this encounter  Medications   atenolol (TENORMIN) 50 MG tablet    Sig: Take 1 tablet (50 mg total) by mouth daily.    Dispense:  90 tablet    Refill:  3   oxyCODONE-acetaminophen (PERCOCET/ROXICET) 5-325 MG tablet    Sig: Take 1 tablet by mouth every 8 (eight) hours as needed for severe pain.    Dispense:  90 tablet    Refill:  0    Dx LBP      Follow-up: Return in about 3 months (around 01/21/2023) for a follow-up visit.  Sonda Primes, MD

## 2022-10-22 NOTE — Assessment & Plan Note (Signed)
We discussed age appropriate health related issues, including available/recomended screening tests and vaccinations. Labs were ordered to be later reviewed . All questions were answered. We discussed one or more of the following - seat belt use, use of sunscreen/sun exposure exercise, safe sex, fall risk reduction, second hand smoke exposure, firearm use and storage, seat belt use, a need for adhering to healthy diet and exercise. Labs were ordered.  All questions were answered. Colon 2011, last 01/2021 - [facility] q 2 years Derm - q 12 mo tDAP 2024

## 2022-12-19 ENCOUNTER — Other Ambulatory Visit (HOSPITAL_COMMUNITY): Payer: Self-pay

## 2022-12-24 ENCOUNTER — Other Ambulatory Visit: Payer: Self-pay

## 2022-12-24 ENCOUNTER — Other Ambulatory Visit (HOSPITAL_COMMUNITY): Payer: Self-pay

## 2022-12-26 ENCOUNTER — Ambulatory Visit: Payer: Commercial Managed Care - HMO | Admitting: Internal Medicine

## 2022-12-26 ENCOUNTER — Other Ambulatory Visit (HOSPITAL_COMMUNITY): Payer: Self-pay

## 2022-12-26 ENCOUNTER — Encounter: Payer: Self-pay | Admitting: Internal Medicine

## 2022-12-26 VITALS — BP 122/78 | HR 70 | Temp 98.0°F | Ht 68.0 in | Wt 181.0 lb

## 2022-12-26 DIAGNOSIS — E559 Vitamin D deficiency, unspecified: Secondary | ICD-10-CM

## 2022-12-26 DIAGNOSIS — D72829 Elevated white blood cell count, unspecified: Secondary | ICD-10-CM | POA: Diagnosis not present

## 2022-12-26 DIAGNOSIS — M545 Low back pain, unspecified: Secondary | ICD-10-CM | POA: Diagnosis not present

## 2022-12-26 DIAGNOSIS — R739 Hyperglycemia, unspecified: Secondary | ICD-10-CM | POA: Diagnosis not present

## 2022-12-26 DIAGNOSIS — G8929 Other chronic pain: Secondary | ICD-10-CM

## 2022-12-26 MED ORDER — OXYCODONE-ACETAMINOPHEN 5-325 MG PO TABS
1.0000 | ORAL_TABLET | Freq: Three times a day (TID) | ORAL | 0 refills | Status: DC | PRN
  Filled 2022-12-26: qty 90, 30d supply, fill #0

## 2022-12-26 NOTE — Assessment & Plan Note (Signed)
D/c calcium Check PTH

## 2022-12-26 NOTE — Assessment & Plan Note (Signed)
A1c

## 2022-12-26 NOTE — Assessment & Plan Note (Signed)
Percocet - rare use.  Prescription provided.  Potential benefits of a short or long term opioids use as well as potential risks (i.e. addiction risk, apnea etc) and complications (i.e. Somnolence, constipation and others) were explained to the patient and were aknowledged. Start yoga 

## 2022-12-26 NOTE — Assessment & Plan Note (Signed)
Check CBC 

## 2022-12-26 NOTE — Assessment & Plan Note (Signed)
On Vit D 

## 2022-12-26 NOTE — Progress Notes (Signed)
Subjective:  Patient ID: Sheila Lin, female    DOB: 1960/08/17  Age: 62 y.o. MRN: [id number]  CC: Follow-up   HPI Sheila Lin presents for LBP, abn CBC, elevated calcium  Outpatient Medications Prior to Visit  Medication Sig Dispense Refill   atenolol (TENORMIN) 50 MG tablet Take 1 tablet (50 mg total) by mouth daily. 90 tablet 3   CALCIUM PO Take 800 mg by mouth daily.      Cholecalciferol (VITAMIN D3) 2000 units capsule Take 1 capsule (2,000 Units total) by mouth daily. 100 capsule 3   oxyCODONE-acetaminophen (PERCOCET/ROXICET) 5-325 MG tablet Take 1 tablet by mouth every 8 (eight) hours as needed for severe pain. 90 tablet 0   No facility-administered medications prior to visit.    ROS: Review of Systems  Constitutional:  Negative for activity change, appetite change, chills, fatigue and unexpected weight change.  HENT:  Negative for congestion, mouth sores and sinus pressure.   Eyes:  Negative for visual disturbance.  Respiratory:  Negative for cough and chest tightness.   Gastrointestinal:  Negative for abdominal pain and nausea.  Genitourinary:  Negative for difficulty urinating, frequency and vaginal pain.  Musculoskeletal:  Negative for back pain and gait problem.  Skin:  Negative for pallor and rash.  Neurological:  Negative for dizziness, tremors, weakness, numbness and headaches.  Psychiatric/Behavioral:  Negative for confusion, sleep disturbance and suicidal ideas.     Objective:  BP 122/78 (BP Location: Left Arm, Patient Position: Sitting, Cuff Size: Large)   Pulse 70   Temp 98 F (36.7 C) (Oral)   Ht 5\' 8"  (1.727 m)   Wt 181 lb (82.1 kg)   SpO2 97%   BMI 27.52 kg/m   BP Readings from Last 3 Encounters:  12/26/22 122/78  10/22/22 122/78  05/22/22 120/78    Wt Readings from Last 3 Encounters:  12/26/22 181 lb (82.1 kg)  10/22/22 181 lb (82.1 kg)  05/22/22 174 lb (78.9 kg)    Physical Exam Constitutional:      General: She is not  in acute distress.    Appearance: Normal appearance. She is well-developed.  HENT:     Head: Normocephalic.     Right Ear: External ear normal.     Left Ear: External ear normal.     Nose: Nose normal.  Eyes:     General:        Right eye: No discharge.        Left eye: No discharge.     Conjunctiva/sclera: Conjunctivae normal.     Pupils: Pupils are equal, round, and reactive to light.  Neck:     Thyroid: No thyromegaly.     Vascular: No JVD.     Trachea: No tracheal deviation.  Cardiovascular:     Rate and Rhythm: Normal rate and regular rhythm.     Heart sounds: Normal heart sounds.  Pulmonary:     Effort: No respiratory distress.     Breath sounds: No stridor. No wheezing.  Abdominal:     General: Bowel sounds are normal. There is no distension.     Palpations: Abdomen is soft. There is no mass.     Tenderness: There is no abdominal tenderness. There is no guarding or rebound.  Musculoskeletal:        General: No tenderness.     Cervical back: Normal range of motion and neck supple. No rigidity.  Lymphadenopathy:     Cervical: No cervical adenopathy.  Skin:  Findings: No erythema or rash.  Neurological:     Cranial Nerves: No cranial nerve deficit.     Motor: No abnormal muscle tone.     Coordination: Coordination normal.     Deep Tendon Reflexes: Reflexes normal.  Psychiatric:        Behavior: Behavior normal.        Thought Content: Thought content normal.        Judgment: Judgment normal.     Lab Results  Component Value Date   WBC 11.7 (H) 10/22/2022   HGB 12.8 10/22/2022   HCT 38.8 10/22/2022   PLT 441.0 (H) 10/22/2022   GLUCOSE 103 (H) 10/22/2022   CHOL 186 10/22/2022   TRIG 75.0 10/22/2022   HDL 61.10 10/22/2022   LDLDIRECT 156.2 03/15/2010   LDLCALC 110 (H) 10/22/2022   ALT 14 10/22/2022   AST 18 10/22/2022   NA 138 10/22/2022   K 4.6 10/22/2022   CL 101 10/22/2022   CREATININE 0.68 10/22/2022   BUN 15 10/22/2022   CO2 27 10/22/2022    TSH 1.01 10/22/2022    MM DIAG BREAST TOMO BILATERAL  Result Date: 04/12/2021 CLINICAL DATA:  62 year old female presenting for annual bilateral mammogram and delayed follow-up of a probably benign right breast mass. EXAM: DIGITAL DIAGNOSTIC BILATERAL MAMMOGRAM WITH TOMOSYNTHESIS AND CAD TECHNIQUE: Bilateral digital diagnostic mammography and breast tomosynthesis was performed. The images were evaluated with computer-aided detection. COMPARISON:  Previous exam(s). ACR Breast Density Category b: There are scattered areas of fibroglandular density. FINDINGS: Previously seen circumscribed mass in the medial right breast at mid depth has completely resolved in the interim. Otherwise, no new or suspicious findings in either breast. The parenchymal pattern is stable. IMPRESSION: No mammographic evidence of malignancy in either breast. RECOMMENDATION: Screening mammogram in one year.(Code:SM-B-01Y) I have discussed the findings and recommendations with the patient. If applicable, a reminder letter will be sent to the patient regarding the next appointment. BI-RADS CATEGORY  1: Negative. Electronically Signed   By: Sheila Lin M.D.   On: 04/12/2021 14:41   Assessment & Plan:   Problem List Items Addressed This Visit     Leukocytosis    Check CBC      Relevant Orders   CBC with Differential/Platelet   Comprehensive metabolic panel   Vitamin D deficiency    On Vit D      Hyperglycemia    A1c      Relevant Orders   Comprehensive metabolic panel   Hemoglobin A1c   Low back pain - Primary    Percocet - rare use.  Prescription provided.  Potential benefits of a short or long term opioids use as well as potential risks (i.e. addiction risk, apnea etc) and complications (i.e. Somnolence, constipation and others) were explained to the patient and were aknowledged. Start yoga      Relevant Medications   oxyCODONE-acetaminophen (PERCOCET/ROXICET) 5-325 MG tablet   Hypercalcemia    D/c  calcium Check PTH      Relevant Orders   PTH, intact and calcium   Comprehensive metabolic panel      Meds ordered this encounter  Medications   oxyCODONE-acetaminophen (PERCOCET/ROXICET) 5-325 MG tablet    Sig: Take 1 tablet by mouth every 8 (eight) hours as needed for severe pain.    Dispense:  90 tablet    Refill:  0    Dx LBP      Follow-up: No follow-ups on file.  Sonda Primes, MD

## 2023-01-17 ENCOUNTER — Other Ambulatory Visit (INDEPENDENT_AMBULATORY_CARE_PROVIDER_SITE_OTHER): Payer: Commercial Managed Care - HMO

## 2023-01-17 DIAGNOSIS — R739 Hyperglycemia, unspecified: Secondary | ICD-10-CM

## 2023-01-17 DIAGNOSIS — D72829 Elevated white blood cell count, unspecified: Secondary | ICD-10-CM

## 2023-01-17 LAB — CBC WITH DIFFERENTIAL/PLATELET
Basophils Absolute: 0.1 10*3/uL (ref 0.0–0.1)
Basophils Relative: 1.4 % (ref 0.0–3.0)
Eosinophils Absolute: 0.2 10*3/uL (ref 0.0–0.7)
Eosinophils Relative: 1.8 % (ref 0.0–5.0)
HCT: 37 % (ref 36.0–46.0)
Hemoglobin: 12 g/dL (ref 12.0–15.0)
Lymphocytes Relative: 25.7 % (ref 12.0–46.0)
Lymphs Abs: 2.7 10*3/uL (ref 0.7–4.0)
MCHC: 32.4 g/dL (ref 30.0–36.0)
MCV: 91.1 fl (ref 78.0–100.0)
Monocytes Absolute: 0.5 10*3/uL (ref 0.1–1.0)
Monocytes Relative: 5.1 % (ref 3.0–12.0)
Neutro Abs: 6.9 10*3/uL (ref 1.4–7.7)
Neutrophils Relative %: 66 % (ref 43.0–77.0)
Platelets: 551 10*3/uL — ABNORMAL HIGH (ref 150.0–400.0)
RBC: 4.06 Mil/uL (ref 3.87–5.11)
RDW: 13.5 % (ref 11.5–15.5)
WBC: 10.4 10*3/uL (ref 4.0–10.5)

## 2023-01-17 LAB — COMPREHENSIVE METABOLIC PANEL
ALT: 15 U/L (ref 0–35)
AST: 18 U/L (ref 0–37)
Albumin: 4.5 g/dL (ref 3.5–5.2)
Alkaline Phosphatase: 84 U/L (ref 39–117)
BUN: 22 mg/dL (ref 6–23)
CO2: 28 mEq/L (ref 19–32)
Calcium: 10.4 mg/dL (ref 8.4–10.5)
Chloride: 103 mEq/L (ref 96–112)
Creatinine, Ser: 0.53 mg/dL (ref 0.40–1.20)
GFR: 98.35 mL/min (ref >60.00)
Glucose, Bld: 102 mg/dL — ABNORMAL HIGH (ref 70–99)
Potassium: 4.1 mEq/L (ref 3.5–5.1)
Sodium: 138 mEq/L (ref 135–145)
Total Bilirubin: 0.5 mg/dL (ref 0.2–1.2)
Total Protein: 7.6 g/dL (ref 6.0–8.3)

## 2023-01-17 LAB — HEMOGLOBIN A1C: Hgb A1c MFr Bld: 5.8 % (ref 4.6–6.5)

## 2023-01-18 ENCOUNTER — Other Ambulatory Visit: Payer: Self-pay | Admitting: Internal Medicine

## 2023-01-18 MED ORDER — ASPIRIN 81 MG PO TBEC: 81.0000 mg | DELAYED_RELEASE_TABLET | Freq: Every day | ORAL | Status: AC

## 2023-02-12 ENCOUNTER — Ambulatory Visit: Payer: Commercial Managed Care - HMO | Admitting: Internal Medicine

## 2023-02-19 ENCOUNTER — Ambulatory Visit: Payer: Commercial Managed Care - HMO | Admitting: Internal Medicine

## 2023-03-19 ENCOUNTER — Encounter: Payer: Self-pay | Admitting: Internal Medicine

## 2023-03-27 ENCOUNTER — Ambulatory Visit: Payer: Managed Care, Other (non HMO) | Admitting: Internal Medicine

## 2023-03-27 ENCOUNTER — Encounter: Payer: Self-pay | Admitting: Internal Medicine

## 2023-03-27 ENCOUNTER — Other Ambulatory Visit (HOSPITAL_COMMUNITY): Payer: Self-pay

## 2023-03-27 VITALS — BP 122/80 | HR 76 | Temp 98.4°F | Ht 68.0 in | Wt 187.0 lb

## 2023-03-27 DIAGNOSIS — D649 Anemia, unspecified: Secondary | ICD-10-CM

## 2023-03-27 DIAGNOSIS — D75839 Thrombocytosis, unspecified: Secondary | ICD-10-CM

## 2023-03-27 DIAGNOSIS — K5790 Diverticulosis of intestine, part unspecified, without perforation or abscess without bleeding: Secondary | ICD-10-CM | POA: Diagnosis not present

## 2023-03-27 DIAGNOSIS — M545 Low back pain, unspecified: Secondary | ICD-10-CM | POA: Diagnosis not present

## 2023-03-27 DIAGNOSIS — E559 Vitamin D deficiency, unspecified: Secondary | ICD-10-CM

## 2023-03-27 DIAGNOSIS — G8929 Other chronic pain: Secondary | ICD-10-CM

## 2023-03-27 DIAGNOSIS — D72829 Elevated white blood cell count, unspecified: Secondary | ICD-10-CM

## 2023-03-27 DIAGNOSIS — R739 Hyperglycemia, unspecified: Secondary | ICD-10-CM

## 2023-03-27 LAB — COMPREHENSIVE METABOLIC PANEL
ALT: 16 U/L (ref 0–35)
AST: 15 U/L (ref 0–37)
Albumin: 4.1 g/dL (ref 3.5–5.2)
Alkaline Phosphatase: 98 U/L (ref 39–117)
BUN: 14 mg/dL (ref 6–23)
CO2: 28 meq/L (ref 19–32)
Calcium: 10.4 mg/dL (ref 8.4–10.5)
Chloride: 105 meq/L (ref 96–112)
Creatinine, Ser: 0.55 mg/dL (ref 0.40–1.20)
GFR: 97.34 mL/min (ref >60.00)
Glucose, Bld: 119 mg/dL — ABNORMAL HIGH (ref 70–99)
Potassium: 4.6 meq/L (ref 3.5–5.1)
Sodium: 141 meq/L (ref 135–145)
Total Bilirubin: 0.4 mg/dL (ref 0.2–1.2)
Total Protein: 7.5 g/dL (ref 6.0–8.3)

## 2023-03-27 LAB — CBC WITH DIFFERENTIAL/PLATELET
Basophils Absolute: 0.2 10*3/uL — ABNORMAL HIGH (ref 0.0–0.1)
Basophils Relative: 1.2 % (ref 0.0–3.0)
Eosinophils Absolute: 0.3 10*3/uL (ref 0.0–0.7)
Eosinophils Relative: 1.9 % (ref 0.0–5.0)
HCT: 32.9 % — ABNORMAL LOW (ref 36.0–46.0)
Hemoglobin: 10.5 g/dL — ABNORMAL LOW (ref 12.0–15.0)
Lymphocytes Relative: 14.9 % (ref 12.0–46.0)
Lymphs Abs: 2 10*3/uL (ref 0.7–4.0)
MCHC: 32 g/dL (ref 30.0–36.0)
MCV: 88.4 fL (ref 78.0–100.0)
Monocytes Absolute: 0.4 10*3/uL (ref 0.1–1.0)
Monocytes Relative: 3.2 % (ref 3.0–12.0)
Neutro Abs: 10.6 10*3/uL — ABNORMAL HIGH (ref 1.4–7.7)
Neutrophils Relative %: 78.8 % — ABNORMAL HIGH (ref 43.0–77.0)
Platelets: 504 10*3/uL — ABNORMAL HIGH (ref 150.0–400.0)
RBC: 3.72 Mil/uL — ABNORMAL LOW (ref 3.87–5.11)
RDW: 13.8 % (ref 11.5–15.5)
WBC: 13.4 10*3/uL — ABNORMAL HIGH (ref 4.0–10.5)

## 2023-03-27 LAB — URINALYSIS
Bilirubin Urine: NEGATIVE
Leukocytes,Ua: NEGATIVE
Nitrite: NEGATIVE
Specific Gravity, Urine: >=1.03 — AB (ref 1.000–1.030)
Total Protein, Urine: NEGATIVE
Urine Glucose: NEGATIVE
Urobilinogen, UA: 0.2 (ref 0.0–1.0)
pH: 6 (ref 5.0–8.0)

## 2023-03-27 LAB — TSH: TSH: 0.35 u[IU]/mL (ref 0.35–5.50)

## 2023-03-27 MED ORDER — OXYCODONE-ACETAMINOPHEN 5-325 MG PO TABS
1.0000 | ORAL_TABLET | Freq: Three times a day (TID) | ORAL | 0 refills | Status: DC | PRN
  Filled 2023-03-27: qty 90, 30d supply, fill #0

## 2023-03-27 NOTE — Assessment & Plan Note (Addendum)
Recent diverticulitis 6 wks ago - resolved w/o Rx GI appt is pending in Dec 2024 - Dr Marina Goodell Start Metamucil

## 2023-03-27 NOTE — Assessment & Plan Note (Signed)
On ASA Check CBC Hem ref if persisted

## 2023-03-27 NOTE — Assessment & Plan Note (Signed)
On Vit D 

## 2023-03-27 NOTE — Progress Notes (Signed)
Subjective:  Patient ID: Sheila Lin, female    DOB: September 05, 1960  Age: 62 y.o. MRN: [id number]  CC: Follow-up (Discuss meds and needing labs)   HPI Sheila Lin presents for LBP C/o recent diverticulitis 6 wks ago - resolved w/o Rx GI appt is pending in Dec 2024 - Dr Marina Goodell F/u on LBP   Outpatient Medications Prior to Visit  Medication Sig Dispense Refill   aspirin EC 81 MG tablet Take 1 tablet (81 mg total) by mouth daily.     atenolol (TENORMIN) 50 MG tablet Take 1 tablet (50 mg total) by mouth daily. 90 tablet 3   CALCIUM PO Take 800 mg by mouth daily.      Cholecalciferol (VITAMIN D3) 2000 units capsule Take 1 capsule (2,000 Units total) by mouth daily. 100 capsule 3   oxyCODONE-acetaminophen (PERCOCET/ROXICET) 5-325 MG tablet Take 1 tablet by mouth every 8 (eight) hours as needed for severe pain. 90 tablet 0   No facility-administered medications prior to visit.    ROS: Review of Systems  Constitutional:  Positive for unexpected weight change. Negative for activity change, appetite change, chills and fatigue.  HENT:  Negative for congestion, mouth sores and sinus pressure.   Eyes:  Negative for visual disturbance.  Respiratory:  Negative for cough and chest tightness.   Gastrointestinal:  Negative for abdominal pain and nausea.  Genitourinary:  Negative for difficulty urinating, frequency and vaginal pain.  Musculoskeletal:  Positive for back pain. Negative for gait problem.  Skin:  Negative for pallor and rash.  Neurological:  Negative for dizziness, tremors, weakness, numbness and headaches.  Psychiatric/Behavioral:  Negative for confusion and sleep disturbance.     Objective:  BP 122/80 (BP Location: Left Arm, Patient Position: Sitting, Cuff Size: Normal)   Pulse 76   Temp 98.4 F (36.9 C) (Oral)   Ht 5\' 8"  (1.727 m)   Wt 187 lb (84.8 kg)   SpO2 95%   BMI 28.43 kg/m   BP Readings from Last 3 Encounters:  03/27/23 122/80  12/26/22 122/78   10/22/22 122/78    Wt Readings from Last 3 Encounters:  03/27/23 187 lb (84.8 kg)  12/26/22 181 lb (82.1 kg)  10/22/22 181 lb (82.1 kg)    Physical Exam Constitutional:      General: She is not in acute distress.    Appearance: She is well-developed. She is obese.  HENT:     Head: Normocephalic.     Right Ear: External ear normal.     Left Ear: External ear normal.     Nose: Nose normal.  Eyes:     General:        Right eye: No discharge.        Left eye: No discharge.     Conjunctiva/sclera: Conjunctivae normal.     Pupils: Pupils are equal, round, and reactive to light.  Neck:     Thyroid: No thyromegaly.     Vascular: No JVD.     Trachea: No tracheal deviation.  Cardiovascular:     Rate and Rhythm: Normal rate and regular rhythm.     Heart sounds: Normal heart sounds.  Pulmonary:     Effort: No respiratory distress.     Breath sounds: No stridor. No wheezing.  Abdominal:     General: Bowel sounds are normal. There is no distension.     Palpations: Abdomen is soft. There is no mass.     Tenderness: There is no abdominal tenderness. There is  no guarding or rebound.  Musculoskeletal:        General: Tenderness present.     Cervical back: Normal range of motion and neck supple. No rigidity.  Lymphadenopathy:     Cervical: No cervical adenopathy.  Skin:    Findings: No erythema or rash.  Neurological:     Mental Status: She is oriented to person, place, and time.     Cranial Nerves: No cranial nerve deficit.     Motor: No abnormal muscle tone.     Coordination: Coordination normal.     Deep Tendon Reflexes: Reflexes normal.  Psychiatric:        Behavior: Behavior normal.        Thought Content: Thought content normal.        Judgment: Judgment normal.   LS w/pain LLQ is sensitive  Lab Results  Component Value Date   WBC 10.4 01/17/2023   HGB 12.0 01/17/2023   HCT 37.0 01/17/2023   PLT 551.0 (H) 01/17/2023   GLUCOSE 102 (H) 01/17/2023   CHOL 186  10/22/2022   TRIG 75.0 10/22/2022   HDL 61.10 10/22/2022   LDLDIRECT 156.2 03/15/2010   LDLCALC 110 (H) 10/22/2022   ALT 15 01/17/2023   AST 18 01/17/2023   NA 138 01/17/2023   K 4.1 01/17/2023   CL 103 01/17/2023   CREATININE 0.53 01/17/2023   BUN 22 01/17/2023   CO2 28 01/17/2023   TSH 1.01 10/22/2022   HGBA1C 5.8 01/17/2023    MM DIAG BREAST TOMO BILATERAL  Result Date: 04/12/2021 CLINICAL DATA:  62 year old female presenting for annual bilateral mammogram and delayed follow-up of a probably benign right breast mass. EXAM: DIGITAL DIAGNOSTIC BILATERAL MAMMOGRAM WITH TOMOSYNTHESIS AND CAD TECHNIQUE: Bilateral digital diagnostic mammography and breast tomosynthesis was performed. The images were evaluated with computer-aided detection. COMPARISON:  Previous exam(s). ACR Breast Density Category b: There are scattered areas of fibroglandular density. FINDINGS: Previously seen circumscribed mass in the medial right breast at mid depth has completely resolved in the interim. Otherwise, no new or suspicious findings in either breast. The parenchymal pattern is stable. IMPRESSION: No mammographic evidence of malignancy in either breast. RECOMMENDATION: Screening mammogram in one year.(Code:[id number]-B-01Y) I have discussed the findings and recommendations with the patient. If applicable, a reminder letter will be sent to the patient regarding the next appointment. BI-RADS CATEGORY  1: Negative. Electronically Signed   By: Sande Brothers M.D.   On: 04/12/2021 14:41   Assessment & Plan:   Problem List Items Addressed This Visit     Leukocytosis    Repeat CBC      Relevant Orders   Comprehensive metabolic panel   CBC with Differential/Platelet   Pain Management Screening Profile (10S)   Urinalysis   TSH   Vitamin D deficiency    On Vit D      Relevant Orders   Urinalysis   Low back pain    Percocet - rare use.  Prescription provided.  Potential benefits of a short or long term opioids  use as well as potential risks (i.e. addiction risk, apnea etc) and complications (i.e. Somnolence, constipation and others) were explained to the patient and were aknowledged.       Relevant Medications   oxyCODONE-acetaminophen (PERCOCET/ROXICET) 5-325 MG tablet   Other Relevant Orders   Pain Management Screening Profile (10S)   Urinalysis   Diverticulosis - Primary     Recent diverticulitis 6 wks ago - resolved w/o Rx GI appt is pending in Dec  2024 - Dr Marina Goodell Start Metamucil      Relevant Orders   Comprehensive metabolic panel   CBC with Differential/Platelet   Pain Management Screening Profile (10S)   Urinalysis   TSH   Thrombocytosis    On ASA Check CBC Hem ref if persisted      Relevant Orders   Comprehensive metabolic panel   CBC with Differential/Platelet   Pain Management Screening Profile (10S)   Urinalysis   TSH      Meds ordered this encounter  Medications   oxyCODONE-acetaminophen (PERCOCET/ROXICET) 5-325 MG tablet    Sig: Take 1 tablet by mouth every 8 (eight) hours as needed for severe pain.    Dispense:  90 tablet    Refill:  0    Dx LBP      Follow-up: Return in about 3 months (around 06/27/2023) for a follow-up visit.  Sonda Primes, MD

## 2023-03-27 NOTE — Assessment & Plan Note (Signed)
Percocet - rare use.  Prescription provided.  Potential benefits of a short or long term opioids use as well as potential risks (i.e. addiction risk, apnea etc) and complications (i.e. Somnolence, constipation and others) were explained to the patient and were aknowledged.

## 2023-03-27 NOTE — Assessment & Plan Note (Signed)
Repeat CBC

## 2023-03-28 LAB — PMP SCREEN PROFILE (10S), URINE
Amphetamine Scrn, Ur: NEGATIVE ng/mL
BARBITURATE SCREEN URINE: NEGATIVE ng/mL
BENZODIAZEPINE SCREEN, URINE: NEGATIVE ng/mL
CANNABINOIDS UR QL SCN: POSITIVE ng/mL — AB
Cocaine (Metab) Scrn, Ur: NEGATIVE ng/mL
Creatinine(Crt), U: 214.5 mg/dL (ref 20.0–300.0)
Methadone Screen, Urine: NEGATIVE ng/mL
OXYCODONE+OXYMORPHONE UR QL SCN: POSITIVE ng/mL — AB
Opiate Scrn, Ur: POSITIVE ng/mL — AB
Ph of Urine: 5.4 (ref 4.5–8.9)
Phencyclidine Qn, Ur: NEGATIVE ng/mL
Propoxyphene Scrn, Ur: NEGATIVE ng/mL

## 2023-04-01 NOTE — Addendum Note (Signed)
Addended by: Tresa Garter on: 04/01/2023 07:38 AM   Modules accepted: Orders

## 2023-04-15 ENCOUNTER — Other Ambulatory Visit (HOSPITAL_COMMUNITY): Payer: Self-pay

## 2023-05-08 ENCOUNTER — Other Ambulatory Visit: Payer: Self-pay | Admitting: Obstetrics and Gynecology

## 2023-05-08 DIAGNOSIS — R1900 Intra-abdominal and pelvic swelling, mass and lump, unspecified site: Secondary | ICD-10-CM

## 2023-05-09 ENCOUNTER — Encounter: Payer: Self-pay | Admitting: Obstetrics and Gynecology

## 2023-05-22 ENCOUNTER — Ambulatory Visit
Admission: RE | Admit: 2023-05-22 | Discharge: 2023-05-22 | Disposition: A | Discharge status: Home / Self Care | Payer: Commercial Managed Care - HMO | Source: Ambulatory Visit | Attending: Obstetrics and Gynecology | Admitting: Obstetrics and Gynecology

## 2023-05-22 DIAGNOSIS — R1900 Intra-abdominal and pelvic swelling, mass and lump, unspecified site: Secondary | ICD-10-CM

## 2023-05-22 MED ORDER — IOPAMIDOL (ISOVUE-300) INJECTION 61%
500.0000 mL | Freq: Once | INTRAVENOUS | Status: AC | PRN
Start: 2023-05-22 — End: 2023-05-22
  Administered 2023-05-22: 85 mL via INTRAVENOUS

## 2023-05-28 ENCOUNTER — Encounter: Payer: Self-pay | Admitting: Gastroenterology

## 2023-05-28 ENCOUNTER — Other Ambulatory Visit: Payer: Commercial Managed Care - HMO

## 2023-05-28 ENCOUNTER — Other Ambulatory Visit (INDEPENDENT_AMBULATORY_CARE_PROVIDER_SITE_OTHER): Payer: Commercial Managed Care - HMO

## 2023-05-28 ENCOUNTER — Ambulatory Visit: Payer: Commercial Managed Care - HMO | Admitting: Gastroenterology

## 2023-05-28 ENCOUNTER — Other Ambulatory Visit (HOSPITAL_COMMUNITY): Payer: Self-pay

## 2023-05-28 VITALS — BP 130/70 | HR 85 | Ht 68.0 in | Wt 167.0 lb

## 2023-05-28 DIAGNOSIS — R933 Abnormal findings on diagnostic imaging of other parts of digestive tract: Secondary | ICD-10-CM | POA: Diagnosis not present

## 2023-05-28 DIAGNOSIS — Z8719 Personal history of other diseases of the digestive system: Secondary | ICD-10-CM

## 2023-05-28 DIAGNOSIS — R194 Change in bowel habit: Secondary | ICD-10-CM

## 2023-05-28 DIAGNOSIS — Z800 Family history of malignant neoplasm of digestive organs: Secondary | ICD-10-CM

## 2023-05-28 DIAGNOSIS — R634 Abnormal weight loss: Secondary | ICD-10-CM | POA: Diagnosis not present

## 2023-05-28 DIAGNOSIS — R1032 Left lower quadrant pain: Secondary | ICD-10-CM | POA: Diagnosis not present

## 2023-05-28 DIAGNOSIS — R1084 Generalized abdominal pain: Secondary | ICD-10-CM

## 2023-05-28 LAB — BASIC METABOLIC PANEL
BUN: 22 mg/dL (ref 6–23)
CO2: 29 meq/L (ref 19–32)
Calcium: 10.2 mg/dL (ref 8.4–10.5)
Chloride: 104 meq/L (ref 96–112)
Creatinine, Ser: 0.58 mg/dL (ref 0.40–1.20)
GFR: 95.99 mL/min (ref >60.00)
Glucose, Bld: 116 mg/dL — ABNORMAL HIGH (ref 70–99)
Potassium: 3.8 meq/L (ref 3.5–5.1)
Sodium: 140 meq/L (ref 135–145)

## 2023-05-28 LAB — CBC
HCT: 29.8 % — ABNORMAL LOW (ref 36.0–46.0)
Hemoglobin: 9.7 g/dL — ABNORMAL LOW (ref 12.0–15.0)
MCHC: 32.5 g/dL (ref 30.0–36.0)
MCV: 87.8 fL (ref 78.0–100.0)
Platelets: 712 10*3/uL — ABNORMAL HIGH (ref 150.0–400.0)
RBC: 3.39 Mil/uL — ABNORMAL LOW (ref 3.87–5.11)
RDW: 13.9 % (ref 11.5–15.5)
WBC: 18.6 10*3/uL (ref 4.0–10.5)

## 2023-05-28 MED ORDER — METRONIDAZOLE 500 MG PO TABS
500.0000 mg | ORAL_TABLET | Freq: Two times a day (BID) | ORAL | 0 refills | Status: DC
  Filled 2023-05-28: qty 28, 14d supply, fill #0

## 2023-05-28 MED ORDER — CIPROFLOXACIN HCL 500 MG PO TABS
500.0000 mg | ORAL_TABLET | Freq: Two times a day (BID) | ORAL | 0 refills | Status: DC
  Filled 2023-05-28: qty 28, 14d supply, fill #0

## 2023-05-28 MED ORDER — OXYCODONE HCL 5 MG PO TABS
10.0000 mg | ORAL_TABLET | Freq: Three times a day (TID) | ORAL | 0 refills | Status: DC | PRN
  Filled 2023-05-28: qty 20, 4d supply, fill #0

## 2023-05-28 NOTE — Patient Instructions (Signed)
We have sent the following medications to your pharmacy for you to pick up at your convenience: Cipro, Flagyl , Oxycodone   You have been scheduled for a colonoscopy. Please follow written instructions given to you at your visit today.   Please pick up your prep supplies at the pharmacy within the next 1-3 days.  If you use inhalers (even only as needed), please bring them with you on the day of your procedure.  DO NOT TAKE 7 DAYS PRIOR TO TEST- Trulicity (dulaglutide) Ozempic, Wegovy (semaglutide) Mounjaro (tirzepatide) Bydureon Bcise (exanatide extended release)  DO NOT TAKE 1 DAY PRIOR TO YOUR TEST Rybelsus (semaglutide) Adlyxin (lixisenatide) Victoza (liraglutide) Byetta (exanatide) ___________________________________________________________________________  Your provider has requested that you go to the basement level for lab work before leaving today. Press "B" on the elevator. The lab is located at the first door on the left as you exit the elevator.   Due to recent changes in healthcare laws, you may see the results of your imaging and laboratory studies on MyChart before your provider has had a chance to review them.  We understand that in some cases there may be results that are confusing or concerning to you. Not all laboratory results come back in the same time frame and the provider may be waiting for multiple results in order to interpret others.  Please give [place] 48 hours in order for your provider to thoroughly review all the results before contacting the office for clarification of your results.   Thank you for choosing me and [facility].  Dr. Meridee Score

## 2023-05-28 NOTE — Progress Notes (Unsigned)
GASTROENTEROLOGY OUTPATIENT CLINIC VISIT   Primary Care Provider [name], [name], MD [address] [place] [address] [phone]  Referring Provider [name], [name], MD [address] [place],  [place] [address] [phone]  Patient Profile: Sheila Lin is a 62 y.o. female with a pmh significant for Arthritis, DJD (previously on opioid therapy), prior shingles, thrombocytosis, family history gastric cancer, diverticulosis (with prior reported episodes of diverticulitis).  The patient presents to the [facility] for an evaluation and management of problem(s) noted below:  Problem List 1. Abnormal CT scan, colon   2. History of diverticulitis   3. Generalized abdominal pain   4. Abdominal pain, left lower quadrant   5. Change in bowel habits   6. Unintentional weight loss   7. Family history of gastric cancer    Discussed the use of AI scribe software for clinical note transcription with the patient, who gave verbal consent to proceed.  History of Present Illness This is the patient's first visit to the [facility] GI clinic.  She has a history of reported prior diverticulitis in the past but more recently, had thought she could be having an episode in September/October but did not end up getting imaging or laboratories and when she saw her PCP in follow-up was doing OK.  However, over the last 6-7 weeks, since that PCP visit, things have changed/progressed.  She is now experiencing significant lower abdominal pain/discomfort.  Her pain is sharp and stabbing at times.  She has noted a change in her bowel habits as well with decreased frequency and thinner caliber stools.  She has a sensation of incomplete evacuation occurring as well.  She has lost nearly 25 pounds in the last 7-weeks.  She had an outpatient evaluation with her gynecologist whom she reports palpated a mass that led to a CT scan being performed last week.  The full results are  noted below but there is concern for an underlying long segment thickening with oral contrast inability to pass the region concerning for possible underlying mass and an enlarged lymph node.  Outpatient labs showed evidence of progressive leukocytosis and thrombocytosis.  She was not prescribed any antibiotic therapy.  Today, she states that things are progressing and worsening, and she is having progressive anorexia because she is worried about worsening her pain.  She is anxious and concerned that she is going to die from colon cancer.  Her last colonoscopy was greater than 10 years ago.  She has a reported family history of gastric cancer in her mother.  She has chronic pain issues related to her back for which she was previously on narcotic therapy, but due to recent UDS she is not being prescribed this by her PCP.  She wants answers as soon as possible.   GI Review of Systems Positive as above Negative for melena  Review of Systems General: Denies fevers/chills Cardiovascular: Denies chest pain Pulmonary: Denies shortness of breath Gastroenterological: See HPI Genitourinary: Denies darkened urine  Hematological: Denies easy bruising/bleeding Endocrine: Denies temperature intolerance Dermatological: Denies jaundice Psychological: Mood is anxious and scared   Medications Current Outpatient Medications  Medication Sig Dispense Refill   aspirin EC 81 MG tablet Take 1 tablet (81 mg total) by mouth daily.     atenolol (TENORMIN) 50 MG tablet Take 1 tablet (50 mg total) by mouth daily. 90 tablet 3   Cholecalciferol (VITAMIN D3) 2000 units capsule Take 1 capsule (2,000 Units total) by mouth daily. 100 capsule 3  ciprofloxacin (CIPRO) 500 MG tablet Take 1 tablet (500 mg total) by mouth 2 (two) times daily. 28 tablet 0   metroNIDAZOLE (FLAGYL) 500 MG tablet Take 1 tablet (500 mg total) by mouth 2 (two) times daily. 28 tablet 0   MULTIPLE VITAMIN PO Take 1 tablet by mouth daily.     Oxycodone  HCl 10 MG TABS Take 1 tablet (10 mg total) by mouth every 8 (eight) hours as needed for up to 7 days. 21 tablet 0   No current facility-administered medications for this visit.    Allergies Allergies  Allergen Reactions   Fish Oil     REACTION: GERD   Penicillins    Tramadol     itching    Histories Past Medical History:  Diagnosis Date   Anemia    Diverticulitis    History of bulimia    LBP (low back pain)    Palpitations 2011   [facility] card   Past Surgical History:  Procedure Laterality Date   ABDOMINAL HYSTERECTOMY     COLONOSCOPY     etopic pregnancy     LAPAROSCOPIC BILATERAL SALPINGO OOPHERECTOMY Right 10/06/2018   Procedure: LAPAROSCOPIC  RIGHT  SALPINGO OOPHORECTOMY;  Surgeon: Lavina Hamman, MD;  Location: [facility];  Service: Gynecology;  Laterality: Right;   Social History   Socioeconomic History   Marital status: Married    Spouse name: Not on file   Number of children: 2   Years of education: Not on file   Highest education level: Not on file  Occupational History   Occupation: retired  Tobacco Use   Smoking status: Never   Smokeless tobacco: Never  Vaping Use   Vaping status: Never Used  Substance and Sexual Activity   Alcohol use: No   Drug use: No   Sexual activity: Yes  Other Topics Concern   Not on file  Social History Narrative   Regular exercise- playing ball   Social Determinants of Health   Financial Resource Strain: Not on file  Food Insecurity: Not on file  Transportation Needs: Not on file  Physical Activity: Not on file  Stress: Not on file  Social Connections: Not on file  Intimate Partner Violence: Not on file   Family History  Problem Relation Age of Onset   Thyroid cancer Mother    Cancer Mother 23       sarcoma   Stomach cancer Mother    Colon cancer Neg Hx    Esophageal cancer Neg Hx    Inflammatory bowel disease Neg Hx    Liver disease Neg Hx    Pancreatic cancer Neg Hx    Rectal cancer Neg  Hx    I have reviewed her medical, social, and family history in detail and updated the electronic medical record as necessary.    PHYSICAL EXAMINATION  BP 130/70   Pulse 85   Ht 5\' 8"  (1.727 m)   Wt 167 lb (75.8 kg)   BMI 25.39 kg/m  Wt Readings from Last 3 Encounters:  05/28/23 167 lb (75.8 kg)  03/27/23 187 lb (84.8 kg)  12/26/22 181 lb (82.1 kg)  GEN: Chronically ill-appearing, nontoxic, appears older than stated age  PSYCH: Cooperative, without pressured speech EYE: Conjunctivae pink, sclerae anicteric ENT: MMM CV: Nontachycardic RESP: No audible wheezing GI: NABS, soft, TTP in LLQ, volitional guarding present, no rebound MSK/EXT: No significant lower extremity edema SKIN: No jaundice NEURO:  Alert & Oriented x 3, no focal deficits  REVIEW OF DATA  I reviewed the following data at the time of this encounter:  GI Procedures and Studies  2011 colonoscopy Reported as normal but do not have access to records/report or who performed this  Laboratory Studies  Reviewed those in epic  Imaging Studies  May 23, 2023 CTAP IMPRESSION: 1. Irregular wall thickening involving a 12.7 cm length of sigmoid colon with adjacent stranding, highly concerning for colonic malignancy. Recommend correlation with colonoscopy. 2. Left external iliac lymph node measures 2.5 x 1.9 cm, suspicious for nodal metastasis. 3.  Aortic Atherosclerosis (ICD10-I70.0).   ASSESSMENT  Ms. Kasprzak is a 62 y.o. female with a pmh significant for The patient is seen today for evaluation and management of:  1. Abnormal CT scan, colon   2. History of diverticulitis   3. Generalized abdominal pain   4. Abdominal pain, left lower quadrant   5. Change in bowel habits   6. Unintentional weight loss   7. Family history of gastric cancer    The patient is hemodynamically stable today.  Clinically however, she has significant GI symptomatology and abnormal imaging that is concerning.  The long  length of thickening noted in the colon, and imaging that suggestive of a mass, seems a little bit out of proportion to what we would think would be normal presentations of colon cancer malignancy.  With that being said we must have a higher degree of concern and with her significant unintentional weight loss over the last few weeks this is concerning.  High risk colonoscopy pursued sooner rather than later does make sense.  I am going to initiate her on antibiotic therapy as she has had previous "diverticulitis" in the past as this could be that.  We are going to schedule her for colonoscopy later this week to ensure that we are not missing a colon cancer.  Will set the CEA level and repeat blood work today.  Her leukocytosis and thrombocytosis suggest more inflammatory process.  I am not sure where things are going to plan but colonoscopy will help [place].  I am going to do in the hospital-based setting as she will be increased risk for perforation.  We would be in the hospital if needed. The risks and benefits of endoscopic evaluation were discussed with the patient; these include but are not limited to the risk of perforation, infection, bleeding, missed lesions, lack of diagnosis, severe illness requiring hospitalization, as well as anesthesia and sedation related illnesses.  The patient and/or family is agreeable to proceed.  All patient questions were answered to the best of my ability, and the patient agrees to the aforementioned plan of action with follow-up as indicated.   PLAN  Laboratories as outlined below Proceed with scheduling colonoscopy in hospital-based setting later this week Ciprofloxacin 500 mg twice daily x 10 days Flagyl 500 mg daily x 10 days Oxycodone short course medication 10 mg every 8 hours as needed Follow-up to be dictated based on results of colonoscopy later this week   Orders Placed This Encounter  Procedures   Procedural/ Surgical Case Request: COLONOSCOPY WITH PROPOFOL    CBC   Basic Metabolic Panel (BMET)   CEA   Ambulatory referral to Gastroenterology    New Prescriptions   CIPROFLOXACIN (CIPRO) 500 MG TABLET    Take 1 tablet (500 mg total) by mouth 2 (two) times daily.   METRONIDAZOLE (FLAGYL) 500 MG TABLET    Take 1 tablet (500 mg total) by mouth 2 (two) times daily.  OXYCODONE HCL 10 MG TABS    Take 1 tablet (10 mg total) by mouth every 8 (eight) hours as needed for up to 7 days.   Modified Medications   No medications on file    Planned Follow Up No follow-ups on file.   Total Time in Face-to-Face and in Coordination of Care for patient including independent/personal interpretation/review of prior testing, medical history, examination, medication adjustment, communicating results with the patient directly, and documentation within the EHR is 45 minutes.Corliss Parish, MD [facility] Advanced Endoscopy Office # [phone]

## 2023-05-29 ENCOUNTER — Encounter (HOSPITAL_COMMUNITY): Payer: Self-pay | Admitting: Gastroenterology

## 2023-05-29 ENCOUNTER — Encounter: Payer: Self-pay | Admitting: Gastroenterology

## 2023-05-29 LAB — CEA: CEA: <2 ng/mL

## 2023-05-29 NOTE — Anesthesia Preprocedure Evaluation (Signed)
Anesthesia Evaluation  Patient identified by MRN, date of birth, ID band Patient awake    Reviewed: Allergy & Precautions, NPO status , Patient's Chart, lab work & pertinent test results  History of Anesthesia Complications Negative for: history of anesthetic complications  Airway Mallampati: I  TM Distance: >3 FB Neck ROM: Full   Comment: Previous grade II view with MAC 3, easy mask Dental  (+) Dental Advisory Given   Pulmonary neg pulmonary ROS   Pulmonary exam normal breath sounds clear to auscultation       Cardiovascular hypertension (atenolol), Pt. on home beta blockers (-) angina (-) Past MI, (-) Cardiac Stents and (-) CABG + dysrhythmias (palpitations)  Rhythm:Regular Rate:Normal     Neuro/Psych  PSYCHIATRIC DISORDERS Anxiety Depression    negative neurological ROS     GI/Hepatic Neg liver ROS, Bowel prep,,,Diverticulosis    Endo/Other  negative endocrine ROS    Renal/GU negative Renal ROS     Musculoskeletal  (+) Arthritis ,    Abdominal   Peds  Hematology  (+) Blood dyscrasia, anemia Lab Results      Component                Value               Date                      WBC                                          05/28/2023            18.6 Repeated and verified X2. (HH)      HGB                      9.7 (L)             05/28/2023                HCT                      29.8 (L)            05/28/2023                MCV                      87.8                05/28/2023                PLT                      712.0 (H)           05/28/2023              Anesthesia Other Findings   Reproductive/Obstetrics                             Anesthesia Physical Anesthesia Plan  ASA: 2  Anesthesia Plan: MAC   Post-op Pain Management:    Induction: Intravenous  PONV Risk Score and Plan: 2 and Propofol infusion, TIVA and Treatment may vary due to age or medical condition  Airway  Management Planned: Natural Airway and Nasal Cannula  Additional Equipment:   Intra-op Plan:   Post-operative Plan:   Informed Consent: I have reviewed the patients History and Physical, chart, labs and discussed the procedure including the risks, benefits and alternatives for the proposed anesthesia with the patient or authorized representative who has indicated his/her understanding and acceptance.     Dental advisory given  Plan Discussed with: Anesthesiologist and CRNA  Anesthesia Plan Comments: (Discussed with patient risks of MAC including, but not limited to, minor pain or discomfort, hearing people in the room, and possible need for backup general anesthesia. Risks for general anesthesia also discussed including, but not limited to, sore throat, hoarse voice, chipped/damaged teeth, injury to vocal cords, nausea and vomiting, allergic reactions, lung infection, heart attack, stroke, and death. All questions answered. )        Anesthesia Quick Evaluation

## 2023-05-30 ENCOUNTER — Other Ambulatory Visit: Payer: Self-pay

## 2023-05-30 ENCOUNTER — Ambulatory Visit (HOSPITAL_COMMUNITY): Payer: Self-pay | Admitting: Anesthesiology

## 2023-05-30 ENCOUNTER — Ambulatory Visit (HOSPITAL_COMMUNITY)
Admission: RE | Admit: 2023-05-30 | Discharge: 2023-05-30 | Disposition: A | Discharge status: Home / Self Care | Payer: Commercial Managed Care - HMO | Attending: Gastroenterology | Admitting: Gastroenterology

## 2023-05-30 ENCOUNTER — Other Ambulatory Visit (HOSPITAL_COMMUNITY): Payer: Self-pay

## 2023-05-30 ENCOUNTER — Encounter (HOSPITAL_COMMUNITY)
Admission: RE | Disposition: A | Discharge status: Home / Self Care | Payer: Self-pay | Source: Home / Self Care | Attending: Gastroenterology

## 2023-05-30 ENCOUNTER — Encounter (HOSPITAL_COMMUNITY): Payer: Self-pay | Admitting: Gastroenterology

## 2023-05-30 DIAGNOSIS — K644 Residual hemorrhoidal skin tags: Secondary | ICD-10-CM | POA: Diagnosis not present

## 2023-05-30 DIAGNOSIS — I10 Essential (primary) hypertension: Secondary | ICD-10-CM | POA: Insufficient documentation

## 2023-05-30 DIAGNOSIS — K529 Noninfective gastroenteritis and colitis, unspecified: Secondary | ICD-10-CM | POA: Diagnosis not present

## 2023-05-30 DIAGNOSIS — D124 Benign neoplasm of descending colon: Secondary | ICD-10-CM | POA: Diagnosis not present

## 2023-05-30 DIAGNOSIS — K5730 Diverticulosis of large intestine without perforation or abscess without bleeding: Secondary | ICD-10-CM | POA: Insufficient documentation

## 2023-05-30 DIAGNOSIS — R948 Abnormal results of function studies of other organs and systems: Secondary | ICD-10-CM | POA: Diagnosis present

## 2023-05-30 DIAGNOSIS — K641 Second degree hemorrhoids: Secondary | ICD-10-CM | POA: Insufficient documentation

## 2023-05-30 DIAGNOSIS — Z860100 Personal history of colon polyps, unspecified: Secondary | ICD-10-CM

## 2023-05-30 DIAGNOSIS — R933 Abnormal findings on diagnostic imaging of other parts of digestive tract: Secondary | ICD-10-CM

## 2023-05-30 DIAGNOSIS — D122 Benign neoplasm of ascending colon: Secondary | ICD-10-CM | POA: Insufficient documentation

## 2023-05-30 DIAGNOSIS — K5732 Diverticulitis of large intestine without perforation or abscess without bleeding: Secondary | ICD-10-CM

## 2023-05-30 DIAGNOSIS — Z8719 Personal history of other diseases of the digestive system: Secondary | ICD-10-CM

## 2023-05-30 HISTORY — PX: POLYPECTOMY: SHX5525

## 2023-05-30 HISTORY — PX: BIOPSY: SHX5522

## 2023-05-30 HISTORY — PX: COLONOSCOPY WITH PROPOFOL: SHX5780

## 2023-05-30 SURGERY — COLONOSCOPY WITH PROPOFOL
Anesthesia: Monitor Anesthesia Care

## 2023-05-30 MED ORDER — OXYCODONE HCL 10 MG PO TABS
10.0000 mg | ORAL_TABLET | Freq: Three times a day (TID) | ORAL | 0 refills | Status: AC | PRN
  Filled 2023-05-30: qty 21, 7d supply, fill #0

## 2023-05-30 MED ORDER — CIPROFLOXACIN IN D5W 400 MG/200ML IV SOLN
INTRAVENOUS | Status: DC | PRN
  Administered 2023-05-30: 400 mg via INTRAVENOUS

## 2023-05-30 MED ORDER — PROPOFOL 500 MG/50ML IV EMUL
INTRAVENOUS | Status: DC | PRN
  Administered 2023-05-30: 125 ug/kg/min via INTRAVENOUS

## 2023-05-30 MED ORDER — SODIUM CHLORIDE 0.9 % IV SOLN: INTRAVENOUS | Status: DC

## 2023-05-30 MED ORDER — LIDOCAINE 2% (20 MG/ML) 5 ML SYRINGE
INTRAMUSCULAR | Status: DC | PRN
Start: 2023-05-30 — End: 2023-05-30
  Administered 2023-05-30: 50 mg via INTRAVENOUS

## 2023-05-30 MED ORDER — PROPOFOL 10 MG/ML IV BOLUS
INTRAVENOUS | Status: DC | PRN
  Administered 2023-05-30: 30 mg via INTRAVENOUS

## 2023-05-30 MED ORDER — SODIUM CHLORIDE 0.9 % IV SOLN: INTRAVENOUS | Status: DC | PRN

## 2023-05-30 MED ORDER — CIPROFLOXACIN IN D5W 400 MG/200ML IV SOLN
INTRAVENOUS | Status: AC
  Filled 2023-05-30: qty 200

## 2023-05-30 SURGICAL SUPPLY — 21 items

## 2023-05-30 NOTE — Discharge Instructions (Signed)

## 2023-05-30 NOTE — Anesthesia Postprocedure Evaluation (Signed)
Anesthesia Post Note  Patient: Chaya Jan  Procedure(s) Performed: COLONOSCOPY WITH PROPOFOL POLYPECTOMY BIOPSY     Patient location during evaluation: PACU Anesthesia Type: MAC Level of consciousness: awake Pain management: pain level controlled Vital Signs Assessment: post-procedure vital signs reviewed and stable Respiratory status: spontaneous breathing, nonlabored ventilation and respiratory function stable Cardiovascular status: stable and blood pressure returned to baseline Postop Assessment: no apparent nausea or vomiting Anesthetic complications: no   No notable events documented.  Last Vitals:  Vitals:   05/30/23 0850 05/30/23 0900  BP: 139/74 (!) 147/65  Pulse: (!) 108 (!) 124  Resp: 16 20  Temp:    SpO2: 100% 94%    Last Pain:  Vitals:   05/30/23 0900  TempSrc:   PainSc: 4                  Linton Rump

## 2023-05-30 NOTE — Progress Notes (Signed)
Instructions given for change in prescription for oxycodone. Patient instructed to take medication only as prescribed.

## 2023-05-30 NOTE — H&P (Signed)
GASTROENTEROLOGY PROCEDURE H&P NOTE   Primary Care Physician: Tresa Garter, MD  HPI: Sheila Lin is a 62 y.o. female who presents for Colonoscopy for evaluation of abnormal imaging concern for malignancy with known diverticular disease previously.  Increased risk procedure due to possible underlying diverticulitis but high concern on imaging for malignancy.  Past Medical History:  Diagnosis Date   Anemia    Diverticulitis    History of bulimia    LBP (low back pain)    Palpitations 2011   [facility] card   Past Surgical History:  Procedure Laterality Date   ABDOMINAL HYSTERECTOMY     COLONOSCOPY     etopic pregnancy     LAPAROSCOPIC BILATERAL SALPINGO OOPHERECTOMY Right 10/06/2018   Procedure: LAPAROSCOPIC  RIGHT  SALPINGO OOPHORECTOMY;  Surgeon: Lavina Hamman, MD;  Location: [facility];  Service: Gynecology;  Laterality: Right;   Current Facility-Administered Medications  Medication Dose Route Frequency Provider Last Rate Last Admin   0.9 %  sodium chloride infusion   Intravenous Continuous Mansouraty, [name]., MD        Current Facility-Administered Medications:    0.9 %  sodium chloride infusion, , Intravenous, Continuous, Mansouraty, [name]., MD Allergies  Allergen Reactions   Fish Oil     REACTION: GERD   Penicillins    Tramadol     itching   Family History  Problem Relation Age of Onset   Thyroid cancer Mother    Cancer Mother 63       sarcoma   Stomach cancer Mother    Colon cancer Neg Hx    Esophageal cancer Neg Hx    Social History   Socioeconomic History   Marital status: Married    Spouse name: Not on file   Number of children: 2   Years of education: Not on file   Highest education level: Not on file  Occupational History   Occupation: retired  Tobacco Use   Smoking status: Never   Smokeless tobacco: Never  Vaping Use   Vaping status: Never Used  Substance and Sexual Activity   Alcohol use: No   Drug  use: No   Sexual activity: Yes  Other Topics Concern   Not on file  Social History Narrative   Regular exercise- playing ball   Social Determinants of Health   Financial Resource Strain: Not on file  Food Insecurity: Not on file  Transportation Needs: Not on file  Physical Activity: Not on file  Stress: Not on file  Social Connections: Not on file  Intimate Partner Violence: Not on file    Physical Exam: There were no vitals filed for this visit. There is no height or weight on file to calculate BMI. GEN: NAD EYE: Sclerae anicteric ENT: MMM CV: Non-tachycardic GI: Soft, NT/ND NEURO:  Alert & Oriented x 3  Lab Results: Recent Labs    05/28/23 1614  WBC 18.6 Repeated and verified X2.*  HGB 9.7*  HCT 29.8*  PLT 712.0*   BMET Recent Labs    05/28/23 1614  NA 140  K 3.8  CL 104  CO2 29  GLUCOSE 116*  BUN 22  CREATININE 0.58  CALCIUM 10.2   LFT No results for input(s): "PROT", "ALBUMIN", "AST", "ALT", "ALKPHOS", "BILITOT", "BILIDIR", "IBILI" in the last 72 hours. PT/INR No results for input(s): "LABPROT", "INR" in the last 72 hours.   Impression / Plan: This is a 62 y.o.female who presents for Colonoscopy for evaluation of abnormal imaging  concern for malignancy with known diverticular disease previously.  Increased risk procedure due to possible underlying diverticulitis but high concern on imaging for malignancy.  The risks and benefits of endoscopic evaluation/treatment were discussed with the patient and/or family; these include but are not limited to the risk of perforation, infection, bleeding, missed lesions, lack of diagnosis, severe illness requiring hospitalization, as well as anesthesia and sedation related illnesses.  The patient's history has been reviewed, patient examined, no change in status, and deemed stable for procedure.  The patient and/or family is agreeable to proceed.    Corliss Parish, MD [facility]  Endoscopy Office # [phone]

## 2023-05-30 NOTE — Op Note (Signed)
[facility] Patient Name: Sheila Lin Procedure Date: 05/30/2023 MRN: [id number] Attending MD: Corliss Parish , MD, [id number] Date of Birth: [birth year] CSN: [id number] Age: 62 Admit Type: Outpatient Procedure:                Colonoscopy Indications:              Abnormal CT of the GI tract, Exclusion of colonic                            obstruction, Exclusion of diverticulitis Providers:                Corliss Parish, MD, Norman Clay, RN, Harrington Challenger,                            Technician Referring MD:              Medicines:                Monitored Anesthesia Care, Cipro 400 mg IV Complications:            No immediate complications. Estimated Blood Loss:     Estimated blood loss was minimal. Procedure:                Pre-Anesthesia Assessment:                           - Prior to the procedure, a History and Physical                            was performed, and patient medications and                            allergies were reviewed. The patient's tolerance of                            previous anesthesia was also reviewed. The risks                            and benefits of the procedure and the sedation                            options and risks were discussed with the patient.                            All questions were answered, and informed consent                            was obtained. Prior Anticoagulants: The patient has                            taken no anticoagulant or antiplatelet agents                            except for aspirin. ASA Grade Assessment: II - A  patient with mild systemic disease. After reviewing                            the risks and benefits, the patient was deemed in                            satisfactory condition to undergo the procedure.                           After obtaining informed consent, the colonoscope                            was passed under direct vision.  Throughout the                            procedure, the patient's blood pressure, pulse, and                            oxygen saturations were monitored continuously. The                            PCF-H190TL ([id number]) Olympus slim colonoscope was                            introduced through the anus and advanced to the 3                            cm into the ileum. The colonoscopy was performed                            without difficulty. The patient tolerated the                            procedure. The quality of the bowel preparation was                            adequate. The terminal ileum, ileocecal valve,                            appendiceal orifice, and rectum were photographed. Scope In: 8:13:46 AM Scope Out: 8:37:06 AM Scope Withdrawal Time: 0 hours 18 minutes 18 seconds  Total Procedure Duration: 0 hours 23 minutes 20 seconds  Findings:      The digital rectal exam findings include hemorrhoids. Pertinent       negatives include no palpable rectal lesions.      Skin tags were found on perianal exam.      The terminal ileum and ileocecal valve appeared normal.      Two sessile polyps were found in the descending colon and ascending       colon. The polyps were 3 to 5 mm in size. These polyps were removed with       a cold snare. Resection and retrieval were complete.      Many medium-mouthed and small-mouthed diverticula were found in the       recto-sigmoid colon,  sigmoid colon and descending colon.      Diffuse moderate inflammation characterized by congestion (edema),       erosions, erythema and granularity was found in the recto-sigmoid colon       and in the distal sigmoid colon and this was noted in between the       diverticulosis (could not truly see overt diverticulitis or pus from any       of the diverticula). This area was slightly narrowed but the colonoscope       was able to traverse with ease. Biopsies were taken with a cold forceps       for  histology.      Normal mucosa was found in the proximal sigmoid colon, in the descending       colon, at the splenic flexure, in the transverse colon, at the hepatic       flexure, in the ascending colon and in the cecum.      Normal mucosa was found in the rectum.      Non-bleeding non-thrombosed external and internal hemorrhoids were found       during retroflexion, during perianal exam and during digital exam. The       hemorrhoids were Grade II (internal hemorrhoids that prolapse but reduce       spontaneously). Impression:               - Hemorrhoids found on digital rectal exam.                            Perianal skin tags found on perianal exam.                           - The examined portion of the ileum was normal.                           - Two 3 to 5 mm polyps in the descending colon and                            in the ascending colon, removed with a cold snare.                            Resected and retrieved.                           - Diverticulosis in the recto-sigmoid colon, in the                            sigmoid colon and in the descending colon.                           - Diffuse moderate inflammation was found in the                            recto-sigmoid colon and in the distal sigmoid colon                            secondary to colitis (this is noted between the  diverticula but overt diverticulitis/pus from any                            of the diverticular openings was not noted). This                            area is narrowed slightly but the colonoscope was                            able to traverse with ease. Biopsied.                           - Normal mucosa in the proximal sigmoid colon, in                            the descending colon, at the splenic flexure, in                            the transverse colon, at the hepatic flexure, in                            the ascending colon and in the cecum.                            - Normal mucosa in the rectum.                           - Non-bleeding non-thrombosed external and internal                            hemorrhoids.                           - No evidence of overt colonic malignancy. Moderate Sedation:      Not Applicable - Patient had care per Anesthesia. Recommendation:           - The patient will be observed post-procedure,                            until all discharge criteria are met.                           - Discharge patient to home.                           - Patient has a contact number available for                            emergencies. The signs and symptoms of potential                            delayed complications were discussed with the                            patient.  Return to normal activities tomorrow.                            Written discharge instructions were provided to the                            patient.                           - High fiber diet.                           - Use FiberCon 1-2 tablets PO daily.                           - Continue present medications.                           - Await pathology results.                           - Repeat colonoscopy in 5 years for surveillance                            based on pathology results.                           - Continue antibiotic therapy for 10-day course as                            already outlined for treatment of possible                            diverticulitis versus colitis.                           - Repeat imaging in 6 weeks.                           - The findings and recommendations were discussed                            with the patient.                           - The findings and recommendations were discussed                            with the patient's family. Procedure Code(s):        --- Professional ---                           [phone], Colonoscopy, flexible; with removal of                             tumor(s), polyp(s), or other lesion(s) by snare  technique                           45380, 59, Colonoscopy, flexible; with biopsy,                            single or multiple Diagnosis Code(s):        --- Professional ---                           K64.1, Second degree hemorrhoids                           D12.4, Benign neoplasm of descending colon                           D12.2, Benign neoplasm of ascending colon                           K52.9, Noninfective gastroenteritis and colitis,                            unspecified                           K64.4, Residual hemorrhoidal skin tags                           K57.30, Diverticulosis of large intestine without                            perforation or abscess without bleeding                           R93.3, Abnormal findings on diagnostic imaging of                            other parts of digestive tract CPT copyright 2022 American [employer] Association. All rights reserved. The codes documented in this report are preliminary and upon coder review may  be revised to meet current compliance requirements. Corliss Parish, MD 05/30/2023 8:50:36 AM Number of Addenda: 0

## 2023-05-30 NOTE — Transfer of Care (Addendum)
Immediate Anesthesia Transfer of Care Note  Patient: Chaya Jan  Procedure(s) Performed: COLONOSCOPY WITH PROPOFOL POLYPECTOMY BIOPSY  Patient Location: Endoscopy Unit  Anesthesia Type:MAC  Level of Consciousness: awake and patient cooperative  Airway & Oxygen Therapy: Patient Spontanous Breathing and Patient connected to face mask  Post-op Assessment: Report given to RN and Post -op Vital signs reviewed and stable  Post vital signs: Reviewed and stable  Last Vitals:  Vitals Value Taken Time  BP 154/121 05/30/23 0841  Temp    Pulse 108 05/30/23 0842  Resp 17 05/30/23 0842  SpO2 100 % 05/30/23 0842  Vitals shown include unfiled device data.  Last Pain: There were no vitals filed for this visit.       Complications: No notable events documented.

## 2023-05-31 ENCOUNTER — Encounter: Payer: Self-pay | Admitting: Gastroenterology

## 2023-05-31 DIAGNOSIS — Z800 Family history of malignant neoplasm of digestive organs: Secondary | ICD-10-CM | POA: Insufficient documentation

## 2023-05-31 DIAGNOSIS — R194 Change in bowel habit: Secondary | ICD-10-CM | POA: Insufficient documentation

## 2023-05-31 DIAGNOSIS — R1032 Left lower quadrant pain: Secondary | ICD-10-CM | POA: Insufficient documentation

## 2023-05-31 DIAGNOSIS — R1084 Generalized abdominal pain: Secondary | ICD-10-CM | POA: Insufficient documentation

## 2023-05-31 LAB — SURGICAL PATHOLOGY

## 2023-06-01 ENCOUNTER — Encounter: Payer: Self-pay | Admitting: Gastroenterology

## 2023-06-01 DIAGNOSIS — R634 Abnormal weight loss: Secondary | ICD-10-CM | POA: Insufficient documentation

## 2023-06-03 ENCOUNTER — Telehealth: Payer: Self-pay

## 2023-06-03 ENCOUNTER — Encounter (HOSPITAL_COMMUNITY): Payer: Self-pay | Admitting: Gastroenterology

## 2023-06-03 DIAGNOSIS — R634 Abnormal weight loss: Secondary | ICD-10-CM

## 2023-06-03 DIAGNOSIS — K529 Noninfective gastroenteritis and colitis, unspecified: Secondary | ICD-10-CM

## 2023-06-03 DIAGNOSIS — Z8719 Personal history of other diseases of the digestive system: Secondary | ICD-10-CM

## 2023-06-03 DIAGNOSIS — R933 Abnormal findings on diagnostic imaging of other parts of digestive tract: Secondary | ICD-10-CM

## 2023-06-03 DIAGNOSIS — R1084 Generalized abdominal pain: Secondary | ICD-10-CM

## 2023-06-03 DIAGNOSIS — R194 Change in bowel habit: Secondary | ICD-10-CM

## 2023-06-03 NOTE — Telephone Encounter (Signed)
Recall entered CT order entered and sent to the schedulers  Pt aware

## 2023-06-03 NOTE — Telephone Encounter (Signed)
CT abdomen/pelvis in 6 weeks for follow-up of colitis and inflammatory process concerning for malignancy (negative colonoscopy).  Colonoscopy recall 7 years.

## 2023-06-10 ENCOUNTER — Other Ambulatory Visit (HOSPITAL_COMMUNITY): Payer: Self-pay

## 2023-06-10 ENCOUNTER — Encounter (HOSPITAL_COMMUNITY): Payer: Self-pay

## 2023-06-10 ENCOUNTER — Other Ambulatory Visit: Payer: Self-pay | Admitting: Gastroenterology

## 2023-06-11 ENCOUNTER — Encounter: Payer: Self-pay | Admitting: Internal Medicine

## 2023-06-11 ENCOUNTER — Ambulatory Visit: Payer: Commercial Managed Care - HMO | Admitting: Internal Medicine

## 2023-06-11 VITALS — BP 120/82 | HR 83 | Temp 98.0°F | Ht 68.0 in | Wt 162.0 lb

## 2023-06-11 DIAGNOSIS — I700 Atherosclerosis of aorta: Secondary | ICD-10-CM

## 2023-06-11 DIAGNOSIS — R739 Hyperglycemia, unspecified: Secondary | ICD-10-CM

## 2023-06-11 DIAGNOSIS — Z860100 Personal history of colon polyps, unspecified: Secondary | ICD-10-CM

## 2023-06-11 NOTE — Patient Instructions (Signed)

## 2023-06-11 NOTE — Progress Notes (Signed)
Subjective:  Patient ID: Sheila Lin, female    DOB: 1960-06-29  Age: 62 y.o. MRN: [id number]  CC: Follow-up (Discuss recent CT results, referral to cardiology if needed due to Aortic Atherosclerosis findings on recent CT)   HPI Sheila Lin presents for abn aorta on abd CT - very worried No CP, abd pain now, no claudication  Outpatient Medications Prior to Visit  Medication Sig Dispense Refill   aspirin EC 81 MG tablet Take 1 tablet (81 mg total) by mouth daily.     atenolol (TENORMIN) 50 MG tablet Take 1 tablet (50 mg total) by mouth daily. 90 tablet 3   Cholecalciferol (VITAMIN D3) 2000 units capsule Take 1 capsule (2,000 Units total) by mouth daily. 100 capsule 3   MULTIPLE VITAMIN PO Take 1 tablet by mouth daily.     ciprofloxacin (CIPRO) 500 MG tablet Take 1 tablet (500 mg total) by mouth 2 (two) times daily. 28 tablet 0   metroNIDAZOLE (FLAGYL) 500 MG tablet Take 1 tablet (500 mg total) by mouth 2 (two) times daily. 28 tablet 0   No facility-administered medications prior to visit.    ROS: Review of Systems  Constitutional:  Negative for activity change, appetite change, chills, fatigue and unexpected weight change.  HENT:  Negative for congestion, mouth sores and sinus pressure.   Eyes:  Negative for visual disturbance.  Respiratory:  Negative for cough, chest tightness and shortness of breath.   Cardiovascular:  Negative for chest pain and palpitations.  Gastrointestinal:  Negative for abdominal pain, blood in stool, constipation and nausea.  Genitourinary:  Negative for difficulty urinating, frequency and vaginal pain.  Musculoskeletal:  Negative for back pain and gait problem.  Skin:  Negative for pallor and rash.  Neurological:  Negative for dizziness, tremors, weakness, numbness and headaches.  Psychiatric/Behavioral:  Negative for confusion and sleep disturbance.     Objective:  BP 120/82 (BP Location: Left Arm, Patient Position: Sitting, Cuff Size:  Normal)   Pulse 83   Temp 98 F (36.7 C) (Oral)   Ht 5\' 8"  (1.727 m)   Wt 162 lb (73.5 kg)   SpO2 97%   BMI 24.63 kg/m   BP Readings from Last 3 Encounters:  06/11/23 120/82  05/30/23 (!) 147/65  05/28/23 130/70    Wt Readings from Last 3 Encounters:  06/11/23 162 lb (73.5 kg)  05/28/23 167 lb (75.8 kg)  03/27/23 187 lb (84.8 kg)    Physical Exam Constitutional:      General: She is not in acute distress.    Appearance: She is well-developed.  HENT:     Head: Normocephalic.     Right Ear: External ear normal.     Left Ear: External ear normal.     Nose: Nose normal.  Eyes:     General:        Right eye: No discharge.        Left eye: No discharge.     Conjunctiva/sclera: Conjunctivae normal.     Pupils: Pupils are equal, round, and reactive to light.  Neck:     Thyroid: No thyromegaly.     Vascular: No JVD.     Trachea: No tracheal deviation.  Cardiovascular:     Rate and Rhythm: Normal rate and regular rhythm.     Heart sounds: Normal heart sounds.  Pulmonary:     Effort: No respiratory distress.     Breath sounds: No stridor. No wheezing.  Abdominal:  General: Bowel sounds are normal. There is no distension.     Palpations: Abdomen is soft. There is no mass.     Tenderness: There is no abdominal tenderness. There is no guarding or rebound.  Musculoskeletal:        General: No tenderness.     Cervical back: Normal range of motion and neck supple. No rigidity.  Lymphadenopathy:     Cervical: No cervical adenopathy.  Skin:    Findings: No erythema or rash.  Neurological:     Cranial Nerves: No cranial nerve deficit.     Motor: No abnormal muscle tone.     Coordination: Coordination normal.     Deep Tendon Reflexes: Reflexes normal.  Psychiatric:        Behavior: Behavior normal.        Thought Content: Thought content normal.        Judgment: Judgment normal.     Lab Results  Component Value Date   WBC 18.6 Repeated and verified X2. (HH)  05/28/2023   HGB 9.7 (L) 05/28/2023   HCT 29.8 (L) 05/28/2023   PLT 712.0 (H) 05/28/2023   GLUCOSE 116 (H) 05/28/2023   CHOL 186 10/22/2022   TRIG 75.0 10/22/2022   HDL 61.10 10/22/2022   LDLDIRECT 156.2 03/15/2010   LDLCALC 110 (H) 10/22/2022   ALT 16 03/27/2023   AST 15 03/27/2023   NA 140 05/28/2023   K 3.8 05/28/2023   CL 104 05/28/2023   CREATININE 0.58 05/28/2023   BUN 22 05/28/2023   CO2 29 05/28/2023   TSH 0.35 03/27/2023   HGBA1C 5.8 01/17/2023    No results found.  Assessment & Plan:   Problem List Items Addressed This Visit     Hyperglycemia   Monitor A1c      History of colonic polyps    Dr Meridee Score Colon 2024 due in 2029      Aortic atherosclerosis (HCC) - Primary   04/2023 on abd CT Sch coronary calcium CT scoring test      Relevant Orders   CT CARDIAC SCORING (SELF PAY ONLY)      No orders of the defined types were placed in this encounter.     Follow-up: Return in about 3 months (around 09/09/2023) for a follow-up visit.  Sonda Primes, MD

## 2023-06-11 NOTE — Assessment & Plan Note (Signed)
04/2023 on abd CT Sch coronary calcium CT scoring test

## 2023-06-11 NOTE — Assessment & Plan Note (Signed)
Monitor A1c 

## 2023-06-11 NOTE — Assessment & Plan Note (Signed)
Dr Meridee Score Colon 2024 due in 2029

## 2023-06-20 ENCOUNTER — Ambulatory Visit: Payer: Commercial Managed Care - HMO | Admitting: Internal Medicine

## 2023-07-03 ENCOUNTER — Other Ambulatory Visit: Payer: Commercial Managed Care - HMO

## 2023-07-04 ENCOUNTER — Other Ambulatory Visit (HOSPITAL_COMMUNITY): Payer: Commercial Managed Care - HMO

## 2023-07-10 ENCOUNTER — Ambulatory Visit (HOSPITAL_COMMUNITY)
Admission: RE | Admit: 2023-07-10 | Discharge: 2023-07-10 | Disposition: A | Discharge status: Home / Self Care | Payer: Commercial Managed Care - HMO | Source: Ambulatory Visit | Attending: Gastroenterology | Admitting: Gastroenterology

## 2023-07-10 DIAGNOSIS — R933 Abnormal findings on diagnostic imaging of other parts of digestive tract: Secondary | ICD-10-CM

## 2023-07-10 DIAGNOSIS — R634 Abnormal weight loss: Secondary | ICD-10-CM | POA: Diagnosis present

## 2023-07-10 DIAGNOSIS — Z8719 Personal history of other diseases of the digestive system: Secondary | ICD-10-CM

## 2023-07-10 DIAGNOSIS — R1084 Generalized abdominal pain: Secondary | ICD-10-CM | POA: Diagnosis present

## 2023-07-10 DIAGNOSIS — K529 Noninfective gastroenteritis and colitis, unspecified: Secondary | ICD-10-CM

## 2023-07-10 DIAGNOSIS — R194 Change in bowel habit: Secondary | ICD-10-CM | POA: Diagnosis present

## 2023-07-10 MED ORDER — IOHEXOL 300 MG/ML  SOLN
30.0000 mL | Freq: Once | INTRAMUSCULAR | Status: AC | PRN
  Administered 2023-07-10: 30 mL via ORAL

## 2023-07-10 MED ORDER — IOHEXOL 300 MG/ML  SOLN
100.0000 mL | Freq: Once | INTRAMUSCULAR | Status: AC | PRN
Start: 2023-07-10 — End: 2023-07-10
  Administered 2023-07-10: 100 mL via INTRAVENOUS

## 2023-07-19 ENCOUNTER — Encounter (HOSPITAL_COMMUNITY): Payer: Self-pay | Admitting: Emergency Medicine

## 2023-07-19 ENCOUNTER — Telehealth: Payer: Self-pay | Admitting: Gastroenterology

## 2023-07-19 ENCOUNTER — Emergency Department (HOSPITAL_COMMUNITY): Payer: Commercial Managed Care - HMO

## 2023-07-19 ENCOUNTER — Other Ambulatory Visit: Payer: Self-pay

## 2023-07-19 ENCOUNTER — Inpatient Hospital Stay (HOSPITAL_COMMUNITY)
Admission: EM | Admit: 2023-07-19 | Discharge: 2023-07-21 | DRG: 392 | Disposition: A | Discharge status: Home / Self Care | Payer: Commercial Managed Care - HMO | Attending: Family Medicine | Admitting: Family Medicine

## 2023-07-19 DIAGNOSIS — Z808 Family history of malignant neoplasm of other organs or systems: Secondary | ICD-10-CM

## 2023-07-19 DIAGNOSIS — D649 Anemia, unspecified: Secondary | ICD-10-CM | POA: Diagnosis present

## 2023-07-19 DIAGNOSIS — Z8719 Personal history of other diseases of the digestive system: Secondary | ICD-10-CM

## 2023-07-19 DIAGNOSIS — Z800 Family history of malignant neoplasm of digestive organs: Secondary | ICD-10-CM

## 2023-07-19 DIAGNOSIS — Z79899 Other long term (current) drug therapy: Secondary | ICD-10-CM

## 2023-07-19 DIAGNOSIS — Z885 Allergy status to narcotic agent status: Secondary | ICD-10-CM | POA: Diagnosis not present

## 2023-07-19 DIAGNOSIS — K5732 Diverticulitis of large intestine without perforation or abscess without bleeding: Secondary | ICD-10-CM | POA: Diagnosis present

## 2023-07-19 DIAGNOSIS — Z7982 Long term (current) use of aspirin: Secondary | ICD-10-CM

## 2023-07-19 DIAGNOSIS — R1032 Left lower quadrant pain: Secondary | ICD-10-CM | POA: Diagnosis present

## 2023-07-19 DIAGNOSIS — K529 Noninfective gastroenteritis and colitis, unspecified: Secondary | ICD-10-CM

## 2023-07-19 DIAGNOSIS — R002 Palpitations: Secondary | ICD-10-CM | POA: Diagnosis not present

## 2023-07-19 DIAGNOSIS — Z888 Allergy status to other drugs, medicaments and biological substances status: Secondary | ICD-10-CM | POA: Diagnosis not present

## 2023-07-19 LAB — URINALYSIS, ROUTINE W REFLEX MICROSCOPIC
Bilirubin Urine: NEGATIVE
Glucose, UA: NEGATIVE mg/dL
Hgb urine dipstick: NEGATIVE
Ketones, ur: NEGATIVE mg/dL
Nitrite: POSITIVE — AB
Protein, ur: 30 mg/dL — AB
Specific Gravity, Urine: 1.021 (ref 1.005–1.030)
WBC, UA: >50 WBC/hpf (ref 0–5)
pH: 5 (ref 5.0–8.0)

## 2023-07-19 LAB — COMPREHENSIVE METABOLIC PANEL
ALT: 13 U/L (ref 0–44)
AST: 12 U/L — ABNORMAL LOW (ref 15–41)
Albumin: 3.6 g/dL (ref 3.5–5.0)
Alkaline Phosphatase: 79 U/L (ref 38–126)
Anion gap: 9 (ref 5–15)
BUN: 30 mg/dL — ABNORMAL HIGH (ref 8–23)
CO2: 25 mmol/L (ref 22–32)
Calcium: 9.7 mg/dL (ref 8.9–10.3)
Chloride: 101 mmol/L (ref 98–111)
Creatinine, Ser: 0.69 mg/dL (ref 0.44–1.00)
GFR, Estimated: >60 mL/min (ref >60)
Glucose, Bld: 119 mg/dL — ABNORMAL HIGH (ref 70–99)
Potassium: 3.7 mmol/L (ref 3.5–5.1)
Sodium: 135 mmol/L (ref 135–145)
Total Bilirubin: 0.3 mg/dL (ref 0.0–1.2)
Total Protein: 7.8 g/dL (ref 6.5–8.1)

## 2023-07-19 LAB — CBC WITH DIFFERENTIAL/PLATELET
Abs Immature Granulocytes: 0.08 10*3/uL — ABNORMAL HIGH (ref 0.00–0.07)
Basophils Absolute: 0.2 10*3/uL — ABNORMAL HIGH (ref 0.0–0.1)
Basophils Relative: 1 %
Eosinophils Absolute: 0.4 10*3/uL (ref 0.0–0.5)
Eosinophils Relative: 3 %
HCT: 32.1 % — ABNORMAL LOW (ref 36.0–46.0)
Hemoglobin: 10 g/dL — ABNORMAL LOW (ref 12.0–15.0)
Immature Granulocytes: 1 %
Lymphocytes Relative: 24 %
Lymphs Abs: 3.5 10*3/uL (ref 0.7–4.0)
MCH: 28.1 pg (ref 26.0–34.0)
MCHC: 31.2 g/dL (ref 30.0–36.0)
MCV: 90.2 fL (ref 80.0–100.0)
Monocytes Absolute: 0.9 10*3/uL (ref 0.1–1.0)
Monocytes Relative: 7 %
Neutro Abs: 9.2 10*3/uL — ABNORMAL HIGH (ref 1.7–7.7)
Neutrophils Relative %: 64 %
Platelets: 616 10*3/uL — ABNORMAL HIGH (ref 150–400)
RBC: 3.56 MIL/uL — ABNORMAL LOW (ref 3.87–5.11)
RDW: 13.9 % (ref 11.5–15.5)
WBC: 14.3 10*3/uL — ABNORMAL HIGH (ref 4.0–10.5)
nRBC: 0 % (ref 0.0–0.2)

## 2023-07-19 LAB — LIPASE, BLOOD: Lipase: 27 U/L (ref 11–51)

## 2023-07-19 MED ORDER — FENTANYL CITRATE PF 50 MCG/ML IJ SOSY: 50.0000 ug | PREFILLED_SYRINGE | Freq: Once | INTRAMUSCULAR | Status: DC | PRN

## 2023-07-19 MED ORDER — FENTANYL CITRATE PF 50 MCG/ML IJ SOSY: 25.0000 ug | PREFILLED_SYRINGE | Freq: Once | INTRAMUSCULAR | Status: DC | PRN

## 2023-07-19 MED ORDER — IOHEXOL 300 MG/ML  SOLN
25.0000 mL | Freq: Once | INTRAMUSCULAR | Status: AC | PRN
  Administered 2023-07-19: 30 mL via ORAL

## 2023-07-19 MED ORDER — CIPROFLOXACIN HCL 500 MG PO TABS
500.0000 mg | ORAL_TABLET | Freq: Two times a day (BID) | ORAL | 0 refills | Status: AC
Start: 2023-07-19 — End: 2023-08-02

## 2023-07-19 MED ORDER — ACETAMINOPHEN 650 MG RE SUPP: 650.0000 mg | Freq: Four times a day (QID) | RECTAL | Status: DC | PRN

## 2023-07-19 MED ORDER — OXYCODONE HCL 5 MG PO TABS
5.0000 mg | ORAL_TABLET | ORAL | Status: DC | PRN
  Administered 2023-07-20 (×3): 5 mg via ORAL
  Filled 2023-07-19 (×3): qty 1

## 2023-07-19 MED ORDER — ACETAMINOPHEN 325 MG PO TABS
650.0000 mg | ORAL_TABLET | Freq: Four times a day (QID) | ORAL | Status: DC | PRN
Start: 2023-07-19 — End: 2023-07-21

## 2023-07-19 MED ORDER — IOHEXOL 300 MG/ML  SOLN
100.0000 mL | Freq: Once | INTRAMUSCULAR | Status: AC | PRN
  Administered 2023-07-19: 100 mL via INTRAVENOUS

## 2023-07-19 MED ORDER — LACTATED RINGERS IV SOLN: INTRAVENOUS | Status: DC

## 2023-07-19 MED ORDER — PIPERACILLIN-TAZOBACTAM 3.375 G IVPB 30 MIN
3.3750 g | Freq: Once | INTRAVENOUS | Status: AC
  Administered 2023-07-19: 3.375 g via INTRAVENOUS
  Filled 2023-07-19: qty 50

## 2023-07-19 MED ORDER — METRONIDAZOLE 250 MG PO TABS: 500.0000 mg | ORAL_TABLET | Freq: Two times a day (BID) | ORAL | 0 refills | Status: AC

## 2023-07-19 MED ORDER — ENOXAPARIN SODIUM 40 MG/0.4ML IJ SOSY
40.0000 mg | PREFILLED_SYRINGE | INTRAMUSCULAR | Status: DC
  Administered 2023-07-20 – 2023-07-21 (×2): 40 mg via SUBCUTANEOUS
  Filled 2023-07-19 (×2): qty 0.4

## 2023-07-19 MED ORDER — SODIUM CHLORIDE 0.9% FLUSH
3.0000 mL | Freq: Two times a day (BID) | INTRAVENOUS | Status: DC
  Administered 2023-07-19 – 2023-07-21 (×4): 3 mL via INTRAVENOUS

## 2023-07-19 MED ORDER — ONDANSETRON HCL 4 MG/2ML IJ SOLN: 4.0000 mg | Freq: Four times a day (QID) | INTRAMUSCULAR | Status: DC | PRN

## 2023-07-19 MED ORDER — PIPERACILLIN-TAZOBACTAM 3.375 G IVPB
3.3750 g | Freq: Three times a day (TID) | INTRAVENOUS | Status: DC
  Administered 2023-07-20 – 2023-07-21 (×5): 3.375 g via INTRAVENOUS
  Filled 2023-07-19 (×5): qty 50

## 2023-07-19 NOTE — Telephone Encounter (Signed)
CT scan ordered and sent to the schedulers to set up

## 2023-07-19 NOTE — ED Provider Notes (Signed)
[place] EMERGENCY DEPARTMENT AT [facility] Provider Note   CSN: [id number] Arrival date & time: 07/19/23  1554     History {Add pertinent medical, surgical, social history, OB history to HPI:1} Chief Complaint  Patient presents with  . Abdominal Pain    Sheila Lin is a 63 y.o. female sent in for evaluation after abnormal CT ordered in the outpatient setting.  Patient has a history of recurrent episodes of colitis/diverticulitis.  She underwent colonoscopy in December 2024 that showed an area of focal colitis, 2 polyps but otherwise normal mucosa along the entire tract of the colon.  Patient reports that she completed 14 days of antibiotics with no improvement in her pain or symptoms.  She had a CT scan performed on 07/10/2023 and was called today with abnormal read (see findings listed below HPI.) patient had a discussion with Dr.Nandigam via telephone today and was called in Cipro and Flagyl however she had worsening pain and was ordered to come to the emergency department.  She denies nausea or vomiting.  She denies fever or chills.    CT scan from 07/10/23.     IMPRESSION: 1. Redemonstrated long segment of irregular wall thickening involving sigmoid colon with considerable inflammatory changes. Differential considerations include colitis of inflammatory etiology, long segment diverticulitis, and neoplasm. 2. New small gas and fluid collection left pelvis measuring 3.2 x 0.4 cm, may be contiguous with small gas tracking towards the junction of the descending colon and sigmoid colon and also communicating with the distal sigmoid colon raising possibility of colo colonic fistula and small abscess. Small contained perforation could also be considered. 3. New small volume gas in the vagina. Indistinct fat plane between the vaginal cuff and the inflamed sigmoid colon cranial to it. Cannot exclude colovaginal fistula. 4. New gas collection within the bladder with  wall thickening along the roof of the bladder. Focal gas and fluid collection superior to the anterior roof of the bladder, also potentially communicating with adjacent inflamed sigmoid colon, collective findings are suspect for colovesical fistula and potential small abscess. 5. 3 cm left pelvic enlarged lymph node versus phlegmonous tissue.    Abdominal Pain      Home Medications Prior to Admission medications   Medication Sig Start Date End Date Taking? Authorizing Provider  aspirin EC 81 MG tablet Take 1 tablet (81 mg total) by mouth daily. 01/18/23 01/18/24  [name], [name], MD  atenolol (TENORMIN) 50 MG tablet Take 1 tablet (50 mg total) by mouth daily. 10/22/22   [name], [name], MD  Cholecalciferol (VITAMIN D3) 2000 units capsule Take 1 capsule (2,000 Units total) by mouth daily. 03/03/18   [name], [name], MD  ciprofloxacin (CIPRO) 500 MG tablet Take 1 tablet (500 mg total) by mouth 2 (two) times daily for 14 days. 07/19/23 08/02/23  Napoleon Form, MD  metroNIDAZOLE (FLAGYL) 250 MG tablet Take 2 tablets (500 mg total) by mouth 2 (two) times daily for 14 days. 07/19/23 08/02/23  Napoleon Form, MD  MULTIPLE VITAMIN PO Take 1 tablet by mouth daily.    [provider]      Allergies    Fish oil and Tramadol    Review of Systems   Review of Systems  Gastrointestinal:  Positive for abdominal pain.    Physical Exam Updated Vital Signs BP 106/64 (BP Location: Right Arm)   Pulse 87   Temp 98.5 F (36.9 C) (Oral)   Resp 16   SpO2 98%  Physical Exam Vitals and nursing note reviewed.  Constitutional:      General: She is not in acute distress.    Appearance: She is well-developed. She is not diaphoretic.  HENT:     Head: Normocephalic and atraumatic.     Right Ear: External ear normal.     Left Ear: External ear normal.     Nose: Nose normal.     Mouth/Throat:     Mouth: Mucous membranes are moist.  Eyes:     General: No scleral icterus.     Conjunctiva/sclera: Conjunctivae normal.  Cardiovascular:     Rate and Rhythm: Normal rate and regular rhythm.     Heart sounds: Normal heart sounds. No murmur heard.    No friction rub. No gallop.  Pulmonary:     Effort: Pulmonary effort is normal. No respiratory distress.     Breath sounds: Normal breath sounds.  Abdominal:     General: Bowel sounds are normal. There is no distension.     Palpations: Abdomen is soft. There is no mass.     Tenderness: There is abdominal tenderness in the periumbilical area, suprapubic area and left lower quadrant. There is guarding and rebound.  Musculoskeletal:     Cervical back: Normal range of motion.  Skin:    General: Skin is warm and dry.  Neurological:     Mental Status: She is alert and oriented to person, place, and time.  Psychiatric:        Behavior: Behavior normal.    ED Results / Procedures / Treatments   Labs (all labs ordered are listed, but only abnormal results are displayed) Labs Reviewed  COMPREHENSIVE METABOLIC PANEL - Abnormal; Notable for the following components:      Result Value   Glucose, Bld 119 (*)    BUN 30 (*)    AST 12 (*)    All other components within normal limits  CBC WITH DIFFERENTIAL/PLATELET - Abnormal; Notable for the following components:   WBC 14.3 (*)    RBC 3.56 (*)    Hemoglobin 10.0 (*)    HCT 32.1 (*)    Platelets 616 (*)    Neutro Abs 9.2 (*)    Basophils Absolute 0.2 (*)    Abs Immature Granulocytes 0.08 (*)    All other components within normal limits  LIPASE, BLOOD  URINALYSIS, ROUTINE W REFLEX MICROSCOPIC    EKG None  Radiology No results found.  Procedures Procedures  {Document cardiac monitor, telemetry assessment procedure when appropriate:1}  Medications Ordered in ED Medications  fentaNYL (SUBLIMAZE) injection 50 mcg (has no administration in time range)  piperacillin-tazobactam (ZOSYN) IVPB 3.375 g (3.375 g Intravenous New Bag/Given 07/19/23 1804)    ED Course/  Medical Decision Making/ A&P Clinical Course as of 07/19/23 1843  Fri Jul 19, 2023  1810 WBC(!): 14.3 [AH]  1810 Comprehensive metabolic panel(!) [AH]  1810 Glucose(!): 119 [AH]  1810 Lipase, blood [AH]  1824 WBC(!): 14.3 [AH]    Clinical Course User Index [AH] Arthor Captain, PA-C   {   Click here for ABCD2, HEART and other calculatorsREFRESH Note before signing :1}                              Medical Decision Making Amount and/or Complexity of Data Reviewed Labs: ordered. Decision-making details documented in ED Course. Radiology: ordered.  Risk Prescription drug management.   This patient presents to the ED with chief  complaint(s) of left lower quadrant abdominal pain with pertinent past medical history of chronic, recurrent colitis which further complicates the presenting complaint. The complaint involves an extensive differential diagnosis and treatment options and also carries with it a high risk of complications and morbidity.    The differential diagnosis includes colitis, diverticulitis, neoplasm.  The initial plan is to order fluids, labs, repeat imaging and to treat patient with Zosyn for intra-abdominal infection  Additional Tests and treatment considered: Patient is low risk / negative by ***, therefore do not feel that *** is indicated.  Additional history obtained: Additional history obtained from {additional history:26846} Records reviewed {records:26847}  Reassessment and review (also see workup area): Lab Tests: I Ordered, and personally interpreted labs.  The pertinent results include:  ***  Imaging Studies: I ordered and independently visualized and interpreted the following imaging {imaging:26848}  which showed *** The interpretation of the imaging was limited to assessing for emergent pathology, for which purpose it was ordered.  Consultations Obtained: I requested consultation with the {consultation:26851}, and discussed  findings as well as  pertinent plan - they recommend: ***  Medicines ordered and prescription drug management: I ordered the following medications *** for ***  I considered this additional medications: ***   Reevaluation of the patient after these medicines showed that the patient    {resolved/improved/worsened:23923::"improved"}  Social Determinants of Health: Patient's {ZOXW:96045}  increases the complexity of managing their presentation  Cardiac Monitoring: The patient was maintained on a cardiac monitor.  I personally viewed and interpreted the cardiac monitor which showed an underlying rhythm of:  {cardiac monitor:26849}  Complexity of problems addressed: Patient's presentation is most consistent with  {COPA:26843} During patient's assessment  Disposition: After consideration of the diagnostic results and the patient's response to treatment,  I feel that the patent would benefit from {disposition:26850}.    {Document critical care time when appropriate:1} {Document review of labs and clinical decision tools ie heart score, Chads2Vasc2 etc:1}  {Document your independent review of radiology images, and any outside records:1} {Document your discussion with family members, caretakers, and with consultants:1} {Document social determinants of health affecting pt's care:1} {Document your decision making why or why not admission, treatments were needed:1} Final Clinical Impression(s) / ED Diagnoses Final diagnoses:  None    Rx / DC Orders ED Discharge Orders     None

## 2023-07-19 NOTE — Progress Notes (Signed)
Pharmacy Antibiotic Note  Sheila Lin is a 63 y.o. female admitted on 07/19/2023 with intra-abdominal infection.  Pharmacy has been consulted for zosyn dosing.  Plan: Zosyn 3.375g IV q8h (4 hour infusion). Pharmacy will sign off and follow peripherally     Temp (24hrs), Avg:98.5 F (36.9 C), Min:98.4 F (36.9 C), Max:98.5 F (36.9 C)  Recent Labs  Lab 07/19/23 1737  WBC 14.3*  CREATININE 0.69    CrCl cannot be calculated (Unknown ideal weight.).    Allergies  Allergen Reactions   Fish Oil     REACTION: GERD   Tramadol     itching     Thank you for allowing pharmacy to be a part of this patient's care.  Arley Phenix RPh 07/19/2023, 10:59 PM

## 2023-07-19 NOTE — Progress Notes (Signed)
ED Pharmacy Antibiotic Sign Off An antibiotic consult was received from an ED provider for Zosyn per pharmacy dosing for [facility]. A chart review was completed to assess appropriateness.   The following one time order(s) were placed:  Zosyn 3.375g IV x 1  Further antibiotic and/or antibiotic pharmacy consults should be ordered by the admitting provider if indicated.   Thank you for allowing pharmacy to be a part of this patient's care.   Misty Stanley [place], [facility]  Clinical Pharmacist 07/19/23 5:19 PM

## 2023-07-19 NOTE — Telephone Encounter (Signed)
The pt states that she has the same abd pain that she has had over the past few weeks.  She says there are days that the pain is worse but today is a good day.  She prefers to try abx at home and will go to the ED if the pain or symptoms worsen.   Appt made to see Dr Meridee Score on 08/30/23 at 8:50 am  Prescriptions have been sent for cipro and flagyl

## 2023-07-19 NOTE — Telephone Encounter (Signed)
S/p colonoscopy patient was treated with a course of antibiotics and oxycodone. Biopsies showed acute focal colitis. Cannot exclude possible contained microperforation.  Will treat with 2-week course of oral ciprofloxacin 500 mg twice daily and metronidazole 500 mg twice daily for 14 days.  Repeat CT abdomen pelvis with contrast in 2 weeks Please  advise patient to come into ER if she develops fever or worsening symptoms.  Please schedule office follow-up visit with Dr. Meridee Score next available.

## 2023-07-19 NOTE — H&P (Signed)
History and Physical    Sheila Lin ZOX:[id number] DOB: 11/27/1960 DOA: 07/19/2023  PCP: Tresa Garter, MD   Patient coming from: Home   Chief Complaint: Abdominal pain, abnormal outpatient CT   HPI: Sheila Lin is a 63 y.o. female with medical history significant for anemia, back pain, palpitations, and recent colitis who presents with ongoing abdominal pain and concerning CT findings despite outpatient treatment with antibiotics.   Patient reports that she developed left lower quadrant abdominal pain in October 2024.  She has been following with GI for this, had CT of the abdomen and pelvis on 05/22/2023 that was concerning for malignancy involving the sigmoid colon.  She underwent colonoscopy on 05/30/2023 with inflammatory changes noted in the rectosigmoid colon which was biopsied and determined to be focal acute colitis and she was treated with antibiotics.  She reports continued worsening in her pain despite antibiotics and had another CT scan concerning for colitis versus diverticulitis versus neoplasia, as well as concern for possible fistula.  She was given another course of antibiotics but continued to worsen and came into the ED.  Denies fevers, chills, nausea, vomiting, or diarrhea.  ED Course: Upon arrival to the ED, patient is found to be afebrile and saturating well on room air with normal heart rate and stable blood pressure.  Labs are most notable for elevated BUN to creatinine ratio, WBC 14,300, hemoglobin 10, platelets 616,000, and UA with many bacteria, pyuria, and nitrites.  CT with IV and rectal contrast reveals persistent diverticulitis changes with decreased extraluminal air, question of small abscesses that would be too small to drain, and no evidence for fistulization.  Patient was started on Zosyn in the ED.  Review of Systems:  All other systems reviewed and apart from HPI, are negative.  Past Medical History:  Diagnosis Date   Anemia     Diverticulitis    History of bulimia    LBP (low back pain)    Palpitations 2011   [facility] card    Past Surgical History:  Procedure Laterality Date   ABDOMINAL HYSTERECTOMY     BIOPSY  05/30/2023   Procedure: BIOPSY;  Surgeon: Lemar Lofty., MD;  Location: [facility];  Service: Gastroenterology;;   COLONOSCOPY     COLONOSCOPY WITH PROPOFOL N/A 05/30/2023   Procedure: COLONOSCOPY WITH PROPOFOL;  Surgeon: Lemar Lofty., MD;  Location: [facility];  Service: Gastroenterology;  Laterality: N/A;   etopic pregnancy     LAPAROSCOPIC BILATERAL SALPINGO OOPHERECTOMY Right 10/06/2018   Procedure: LAPAROSCOPIC  RIGHT  SALPINGO OOPHORECTOMY;  Surgeon: Lavina Hamman, MD;  Location: [facility];  Service: Gynecology;  Laterality: Right;   POLYPECTOMY  05/30/2023   Procedure: POLYPECTOMY;  Surgeon: Mansouraty, [name]., MD;  Location: [facility];  Service: Gastroenterology;;    Social History:   reports that she has never smoked. She has never used smokeless tobacco. She reports that she does not drink alcohol and does not use drugs.  Allergies  Allergen Reactions   Fish Oil     REACTION: GERD   Tramadol     itching    Family History  Problem Relation Age of Onset   Thyroid cancer Mother    Cancer Mother 1       sarcoma   Stomach cancer Mother    Colon cancer Neg Hx    Esophageal cancer Neg Hx    Inflammatory bowel disease Neg Hx    Liver disease Neg Hx    Pancreatic  cancer Neg Hx    Rectal cancer Neg Hx      Prior to Admission medications   Medication Sig Start Date End Date Taking? Authorizing Provider  aspirin EC 81 MG tablet Take 1 tablet (81 mg total) by mouth daily. 01/18/23 01/18/24 Yes [name], [name], MD  atenolol (TENORMIN) 50 MG tablet Take 1 tablet (50 mg total) by mouth daily. 10/22/22  Yes [name], [name], MD  Cholecalciferol (VITAMIN D3) 2000 units capsule Take 1 capsule (2,000 Units total) by mouth daily. 03/03/18  Yes  [name], [name], MD  ciprofloxacin (CIPRO) 500 MG tablet Take 1 tablet (500 mg total) by mouth 2 (two) times daily for 14 days. 07/19/23 08/02/23 Yes Nandigam, Eleonore Chiquito, MD  metroNIDAZOLE (FLAGYL) 250 MG tablet Take 2 tablets (500 mg total) by mouth 2 (two) times daily for 14 days. 07/19/23 08/02/23 Yes Nandigam, Eleonore Chiquito, MD  MULTIPLE VITAMIN PO Take 1 tablet by mouth daily.   Yes [provider]    Physical Exam: Vitals:   07/19/23 1559 07/19/23 1923  BP: 106/64 (!) 105/59  Pulse: 87 72  Resp: 16 18  Temp: 98.5 F (36.9 C) 98.4 F (36.9 C)  TempSrc: Oral Oral  SpO2: 98% 96%    Constitutional: NAD, calm  Eyes: PERTLA, lids and conjunctivae normal ENMT: Mucous membranes are moist. Posterior pharynx clear of any exudate or lesions.   Neck: supple, no masses  Respiratory: no wheezing, no crackles. No accessory muscle use.  Cardiovascular: S1 & S2 heard, regular rate and rhythm. No extremity edema.   Abdomen: Soft, tender in LLQ, no guarding. Bowel sounds active.  Musculoskeletal: no clubbing / cyanosis. No joint deformity upper and lower extremities.   Skin: no significant rashes, lesions, ulcers. Warm, dry, well-perfused. Neurologic: CN 2-12 grossly intact. Moving all extremities. Alert and oriented.  Psychiatric: Pleasant. Cooperative.    Labs and Imaging on Admission: I have personally reviewed following labs and imaging studies  CBC: Recent Labs  Lab 07/19/23 1737  WBC 14.3*  NEUTROABS 9.2*  HGB 10.0*  HCT 32.1*  MCV 90.2  PLT 616*   Basic Metabolic Panel: Recent Labs  Lab 07/19/23 1737  NA 135  K 3.7  CL 101  CO2 25  GLUCOSE 119*  BUN 30*  CREATININE 0.69  CALCIUM 9.7   GFR: CrCl cannot be calculated (Unknown ideal weight.). Liver Function Tests: Recent Labs  Lab 07/19/23 1737  AST 12*  ALT 13  ALKPHOS 79  BILITOT 0.3  PROT 7.8  ALBUMIN 3.6   Recent Labs  Lab 07/19/23 1737  LIPASE 27   No results for input(s): "AMMONIA" in the  last 168 hours. Coagulation Profile: No results for input(s): "INR", "PROTIME" in the last 168 hours. Cardiac Enzymes: No results for input(s): "CKTOTAL", "CKMB", "CKMBINDEX", "TROPONINI" in the last 168 hours. BNP (last 3 results) No results for input(s): "PROBNP" in the last 8760 hours. HbA1C: No results for input(s): "HGBA1C" in the last 72 hours. CBG: No results for input(s): "GLUCAP" in the last 168 hours. Lipid Profile: No results for input(s): "CHOL", "HDL", "LDLCALC", "TRIG", "CHOLHDL", "LDLDIRECT" in the last 72 hours. Thyroid Function Tests: No results for input(s): "TSH", "T4TOTAL", "FREET4", "T3FREE", "THYROIDAB" in the last 72 hours. Anemia Panel: No results for input(s): "VITAMINB12", "FOLATE", "FERRITIN", "TIBC", "IRON", "RETICCTPCT" in the last 72 hours. Urine analysis:    Component Value Date/Time   COLORURINE YELLOW 07/19/2023 1745   APPEARANCEUR HAZY (A) 07/19/2023 1745   LABSPEC 1.021 07/19/2023 1745  PHURINE 5.0 07/19/2023 1745   GLUCOSEU NEGATIVE 07/19/2023 1745   GLUCOSEU NEGATIVE 03/27/2023 0855   HGBUR NEGATIVE 07/19/2023 1745   BILIRUBINUR NEGATIVE 07/19/2023 1745   BILIRUBINUR NEGATVE 04/12/2021 0827   KETONESUR NEGATIVE 07/19/2023 1745   PROTEINUR 30 (A) 07/19/2023 1745   UROBILINOGEN 0.2 03/27/2023 0855   NITRITE POSITIVE (A) 07/19/2023 1745   LEUKOCYTESUR LARGE (A) 07/19/2023 1745   Sepsis Labs: @LABRCNTIP (procalcitonin:4,lacticidven:4) )No results found for this or any previous visit (from the past 240 hours).   Radiological Exams on Admission: CT ABDOMEN PELVIS W CONTRAST Result Date: 07/19/2023 CLINICAL DATA:  History of diverticulitis with known air in the urinary bladder on prior imaging, evaluate for possible colovesical fistula. EXAM: CT ABDOMEN AND PELVIS WITH CONTRAST TECHNIQUE: Multidetector CT imaging of the abdomen and pelvis was performed using the standard protocol following bolus administration of intravenous contrast.  Additionally rectal contrast was administered. RADIATION DOSE REDUCTION: This exam was performed according to the departmental dose-optimization program which includes automated exposure control, adjustment of the mA and/or kV according to patient size and/or use of iterative reconstruction technique. CONTRAST:  OMNIPAQUE IOHEXOL 300 MG/ML SOLN, 30mL OMNIPAQUE IOHEXOL 300 MG/ML SOLN COMPARISON:  07/10/2023 FINDINGS: Lower chest: No acute abnormality. Hepatobiliary: Gallbladder is within normal limits. Liver demonstrates multiple cysts simple in appearance and stable from the prior exam. Pancreas: Unremarkable. No pancreatic ductal dilatation or surrounding inflammatory changes. Spleen: Normal in size without focal abnormality. Adrenals/Urinary Tract: Adrenal glands are within normal limits. Kidneys demonstrate a normal enhancement pattern bilaterally. A few small scattered cysts are seen. No follow-up is recommended. No calculi or obstructive changes are noted. The bladder is partially distended again with a small amount of air within. Anterior to superior aspect of the bladder in the midline is a small fluid collection which previously showed air within likely representing a small abscess. Stomach/Bowel: Rectal contrast has been administered. This outlines the colonic lumen and changes of diverticulitis which persist in the sigmoid colon. Wall thickening and pericolonic inflammatory changes are again noted. The collection of air along the left side of the sigmoid colon in the pelvis seen on the prior exam is less well visualized now with a more solid area of inflammatory change without air within. No extension of contrast material into this area is identified. Additionally no extravasation of contrast is seen to delineate a colovesical fistula. More proximal colon shows no significant inflammatory changes. No obstructive changes are noted. The appendix is within normal limits. Small bowel and stomach are  unremarkable. Vascular/Lymphatic: Aortic atherosclerosis. No enlarged abdominal or pelvic lymph nodes. Reproductive: Uterus has been surgically removed. Air is again noted in the vaginal vault although no findings to suggest colo-vaginal fistula are seen. Other: No abdominal wall hernia or abnormality. No abdominopelvic ascites. Musculoskeletal: No acute or significant osseous findings. IMPRESSION: Persistent changes of diverticulitis although the degree of extraluminal air has improved when compared with the prior exam. No areas of extravasation of contrast from the rectosigmoid are seen to delineate a colovesical or colovaginal fistula. Persistent areas of inflammatory change are noted along the left wall of the sigmoid and along the superior aspect of the urinary bladder which again may represent small abscesses although not drainable at this time. Again no filling of contrast in these areas is noted to suggest fistulization. Electronically Signed   By: Alcide Clever M.D.   On: 07/19/2023 21:01    Assessment/Plan  1. Acute diverticulitis  - No frank perforation, fistula, or obstruction noted on  CT  - Zosyn, pain-control, IVF, clear liquid diet, follow clinical course      DVT prophylaxis: Lovenox  Code Status: Full  Level of Care: Level of care: Med-Surg Family Communication: None present   Disposition Plan:  Patient is from: Home  Anticipated d/c is to: Home Anticipated d/c date is: 07/22/23  Patient currently: Pending clinical improvement  Consults called: None  Admission status: Inpatient     Briscoe Deutscher, MD [facility]  07/19/2023, 11:02 PM

## 2023-07-19 NOTE — Telephone Encounter (Signed)
Dr Lavon Paganini as DOD can you please review the pt CT scan from 07/10/23.    IMPRESSION: 1. Redemonstrated long segment of irregular wall thickening involving sigmoid colon with considerable inflammatory changes. Differential considerations include colitis of inflammatory etiology, long segment diverticulitis, and neoplasm. 2. New small gas and fluid collection left pelvis measuring 3.2 x 0.4 cm, may be contiguous with small gas tracking towards the junction of the descending colon and sigmoid colon and also communicating with the distal sigmoid colon raising possibility of colo colonic fistula and small abscess. Small contained perforation could also be considered. 3. New small volume gas in the vagina. Indistinct fat plane between the vaginal cuff and the inflamed sigmoid colon cranial to it. Cannot exclude colovaginal fistula. 4. New gas collection within the bladder with wall thickening along the roof of the bladder. Focal gas and fluid collection superior to the anterior roof of the bladder, also potentially communicating with adjacent inflamed sigmoid colon, collective findings are suspect for colovesical fistula and potential small abscess. 5. 3 cm left pelvic enlarged lymph node versus phlegmonous tissue.   These results will be called to the ordering clinician or representative by the Radiologist Assistant, and communication documented in the PACS or [employer].     Electronically Signed   By: Jasmine Pang M.D.   On: 07/18/2023 23:34      Result History

## 2023-07-19 NOTE — Telephone Encounter (Signed)
Radiology rep call and stated that he had the results for this patient CT scan and wanted to verbalize the result with the nurse. Radiologist stated a good call back number for them is [phone]. Please advise.

## 2023-07-19 NOTE — ED Triage Notes (Signed)
Pt here from home sent by her PCP for an abnormal ct scan for possible admission , pt is on her second round of antibiotics for diverticulitis , no pain at present

## 2023-07-20 ENCOUNTER — Other Ambulatory Visit: Payer: Self-pay

## 2023-07-20 DIAGNOSIS — D649 Anemia, unspecified: Secondary | ICD-10-CM

## 2023-07-20 DIAGNOSIS — R1032 Left lower quadrant pain: Secondary | ICD-10-CM

## 2023-07-20 DIAGNOSIS — R002 Palpitations: Secondary | ICD-10-CM | POA: Diagnosis not present

## 2023-07-20 DIAGNOSIS — K5732 Diverticulitis of large intestine without perforation or abscess without bleeding: Secondary | ICD-10-CM | POA: Diagnosis not present

## 2023-07-20 LAB — BASIC METABOLIC PANEL
Anion gap: 10 (ref 5–15)
BUN: 21 mg/dL (ref 8–23)
CO2: 27 mmol/L (ref 22–32)
Calcium: 9.9 mg/dL (ref 8.9–10.3)
Chloride: 103 mmol/L (ref 98–111)
Creatinine, Ser: 0.66 mg/dL (ref 0.44–1.00)
GFR, Estimated: >60 mL/min (ref >60)
Glucose, Bld: 100 mg/dL — ABNORMAL HIGH (ref 70–99)
Potassium: 3.7 mmol/L (ref 3.5–5.1)
Sodium: 140 mmol/L (ref 135–145)

## 2023-07-20 LAB — CBC
HCT: 32.4 % — ABNORMAL LOW (ref 36.0–46.0)
Hemoglobin: 10.2 g/dL — ABNORMAL LOW (ref 12.0–15.0)
MCH: 28.4 pg (ref 26.0–34.0)
MCHC: 31.5 g/dL (ref 30.0–36.0)
MCV: 90.3 fL (ref 80.0–100.0)
Platelets: 570 10*3/uL — ABNORMAL HIGH (ref 150–400)
RBC: 3.59 MIL/uL — ABNORMAL LOW (ref 3.87–5.11)
RDW: 14 % (ref 11.5–15.5)
WBC: 14.5 10*3/uL — ABNORMAL HIGH (ref 4.0–10.5)
nRBC: 0 % (ref 0.0–0.2)

## 2023-07-20 LAB — HIV ANTIBODY (ROUTINE TESTING W REFLEX): HIV Screen 4th Generation wRfx: NONREACTIVE

## 2023-07-20 LAB — MAGNESIUM: Magnesium: 2.2 mg/dL (ref 1.7–2.4)

## 2023-07-20 MED ORDER — ATENOLOL 25 MG PO TABS
50.0000 mg | ORAL_TABLET | Freq: Every day | ORAL | Status: DC
  Administered 2023-07-20 – 2023-07-21 (×2): 50 mg via ORAL
  Filled 2023-07-20: qty 1
  Filled 2023-07-20: qty 2

## 2023-07-20 MED ORDER — BOOST / RESOURCE BREEZE PO LIQD CUSTOM
1.0000 | Freq: Three times a day (TID) | ORAL | Status: DC
  Administered 2023-07-20 – 2023-07-21 (×4): 1 via ORAL

## 2023-07-20 NOTE — Assessment & Plan Note (Addendum)
See excellent summary from Dr. Meridee Score from 12/3  In brief, she has developed progressive sharp lower abdominal pain over the last 3 months.  Colonoscopy showed nonspecific colitis, pathology report not consistent with inflammatory bowel disease, mostly suggest this was a resolving infectious colitis, possibly diverticulitis.  Was treated with antibiotics, but symptoms returned and are now worse.  CT now shows extraluminal fluid collections, possible fistulization - Continue Zosyn - Clear liquid diet - Consult general surgery and gastroenterology, appreciate expertise - Hold aspirin

## 2023-07-20 NOTE — Progress Notes (Signed)
  Progress Note   Patient: Sheila Lin ZOX:[id number] DOB: 1961-04-24 DOA: 07/19/2023     1 DOS: the patient was seen and examined on 07/20/2023        Brief hospital course: 63 y.o. F who presented with ongoing abdominal pain and abnormal CT of the colon.  Developed LLQ in Oct 2024, had CT and colonoscopy, appeared to be colitis, treated with antibiotics, no relief.  Pain continued, and so repeat CT was obtained this week that showed ongoing colon thickening, possible infection, also possible fistula.  Sent to the ER for work up.     Assessment and Plan: * Diverticulitis large intestine See excellent summary from Dr. Meridee Score from 12/3  In brief, she has developed progressive sharp lower abdominal pain over the last 3 months.  Colonoscopy showed nonspecific colitis, pathology report not consistent with inflammatory bowel disease, mostly suggest this was a resolving infectious colitis, possibly diverticulitis.  Was treated with antibiotics, but symptoms returned and are now worse.  CT now shows extraluminal fluid collections, possible fistulization - Continue Zosyn - Clear liquid diet - Consult general surgery and gastroenterology, appreciate expertise - Hold aspirin    Palpitations Takes atenolol for palpitations only.  No HTN. - Continue atenolol  Normocytic anemia Mild anemia. - Chek iron stores, retics          Subjective: Patient is feeling unchanged.  She has had no fever overnight.  She still has sharp lower quadrant pain.     Physical Exam: BP (!) 115/56   Pulse 78   Temp 98 F (36.7 C) (Oral)   Resp 18   SpO2 97%   Thin adult female, sitting up in bed, interactive and appropriate RRR, no murmurs, no peripheral edema Respiratory normal, lungs clear without rales or wheezes Abdomen soft, no guarding anywhere.  No rigidity, no distention.  She has tenderness mostly in the left lower quadrant. Attention normal, affect appropriate, judgment  and insight appear normal, oriented to person, place, and time    Data Reviewed: Discussed with general surgery and GI CT report reviewed, shows extraluminal fluid collections and possible fistulization as described above white blood cell count 14K Hemoglobin 10.2, normocytic Basic metabolic panel normal Urinalysis with many white cells and bacteria    Family Communication: None present    Disposition: Status is: Inpatient         Author: Alberteen Sam, MD 07/20/2023 9:10 AM  For on call review [email or web address].

## 2023-07-20 NOTE — ED Notes (Signed)
Pt ambulated to bathroom

## 2023-07-20 NOTE — Consult Note (Signed)
Reason for Consult: Diverticulitis Referring Physician: Maryfrances Bunnell MD  Sheila Lin is an 63 y.o. female.  HPI: Pleasant 63 year old female with a history of abdominal pain since October 2024.  She has been treated for diverticulitis and followed by GI medicine.  Colonoscopy showed diverticular inflammation and colitis.  She was readmitted for abdominal pain.  CT scan showed what appeared to be chronic diverticulitis.  Some question about abscess.  There is some concern for fistula.  I reviewed the CT scan myself.  This appears to be consistent with chronic diverticulitis.  Her pain is waxed and waned but is never cleared.  She does have a mild leukocytosis.  She is comfortable now though.  No blood in her stool.  No coke colored or bubbles in urine.  No drainage from vagina  Past Medical History:  Diagnosis Date   Anemia    Diverticulitis    History of bulimia    LBP (low back pain)    Palpitations 2011   [facility] card    Past Surgical History:  Procedure Laterality Date   ABDOMINAL HYSTERECTOMY     BIOPSY  05/30/2023   Procedure: BIOPSY;  Surgeon: Lemar Lofty., MD;  Location: [facility];  Service: Gastroenterology;;   COLONOSCOPY     COLONOSCOPY WITH PROPOFOL N/A 05/30/2023   Procedure: COLONOSCOPY WITH PROPOFOL;  Surgeon: Lemar Lofty., MD;  Location: [facility];  Service: Gastroenterology;  Laterality: N/A;   etopic pregnancy     LAPAROSCOPIC BILATERAL SALPINGO OOPHERECTOMY Right 10/06/2018   Procedure: LAPAROSCOPIC  RIGHT  SALPINGO OOPHORECTOMY;  Surgeon: Lavina Hamman, MD;  Location: [facility];  Service: Gynecology;  Laterality: Right;   POLYPECTOMY  05/30/2023   Procedure: POLYPECTOMY;  Surgeon: Mansouraty, [name]., MD;  Location: [facility];  Service: Gastroenterology;;    Family History  Problem Relation Age of Onset   Thyroid cancer Mother    Cancer Mother 73       sarcoma   Stomach cancer Mother    Colon cancer Neg Hx     Esophageal cancer Neg Hx    Inflammatory bowel disease Neg Hx    Liver disease Neg Hx    Pancreatic cancer Neg Hx    Rectal cancer Neg Hx     Social History:  reports that she has never smoked. She has never used smokeless tobacco. She reports that she does not drink alcohol and does not use drugs.  Allergies:  Allergies  Allergen Reactions   Fish Oil     REACTION: GERD   Tramadol     itching    Medications: I have reviewed the patient's current medications.  Results for orders placed or performed during the hospital encounter of 07/19/23 (from the past 48 hours)  Comprehensive metabolic panel     Status: Abnormal   Collection Time: 07/19/23  5:37 PM  Result Value Ref Range   Sodium 135 135 - 145 mmol/L   Potassium 3.7 3.5 - 5.1 mmol/L   Chloride 101 98 - 111 mmol/L   CO2 25 22 - 32 mmol/L   Glucose, Bld 119 (H) 70 - 99 mg/dL    Comment: Glucose reference range applies only to samples taken after fasting for at least 8 hours.   BUN 30 (H) 8 - 23 mg/dL   Creatinine, Ser 7.82 0.44 - 1.00 mg/dL   Calcium 9.7 8.9 - 95.6 mg/dL   Total Protein 7.8 6.5 - 8.1 g/dL   Albumin 3.6 3.5 - 5.0 g/dL  AST 12 (L) 15 - 41 U/L   ALT 13 0 - 44 U/L   Alkaline Phosphatase 79 38 - 126 U/L   Total Bilirubin 0.3 0.0 - 1.2 mg/dL   GFR, Estimated >28 >41 mL/min    Comment: (NOTE) Calculated using the CKD-EPI Creatinine Equation (2021)    Anion gap 9 5 - 15    Comment: Performed at [facility], [address], [place], [place] [address]  Lipase, blood     Status: None   Collection Time: 07/19/23  5:37 PM  Result Value Ref Range   Lipase 27 11 - 51 U/L    Comment: Performed at [facility], [address] [place], [place] [address]  CBC with Diff     Status: Abnormal   Collection Time: 07/19/23  5:37 PM  Result Value Ref Range   WBC 14.3 (H) 4.0 - 10.5 K/uL   RBC 3.56 (L) 3.87 - 5.11 MIL/uL   Hemoglobin 10.0 (L) 12.0 - 15.0 g/dL   HCT 53.6 (L) 64.4  - 46.0 %   MCV 90.2 80.0 - 100.0 fL   MCH 28.1 26.0 - 34.0 pg   MCHC 31.2 30.0 - 36.0 g/dL   RDW 03.4 74.2 - 59.5 %   Platelets 616 (H) 150 - 400 K/uL   nRBC 0.0 0.0 - 0.2 %   Neutrophils Relative % 64 %   Neutro Abs 9.2 (H) 1.7 - 7.7 K/uL   Lymphocytes Relative 24 %   Lymphs Abs 3.5 0.7 - 4.0 K/uL   Monocytes Relative 7 %   Monocytes Absolute 0.9 0.1 - 1.0 K/uL   Eosinophils Relative 3 %   Eosinophils Absolute 0.4 0.0 - 0.5 K/uL   Basophils Relative 1 %   Basophils Absolute 0.2 (H) 0.0 - 0.1 K/uL   Immature Granulocytes 1 %   Abs Immature Granulocytes 0.08 (H) 0.00 - 0.07 K/uL    Comment: Performed at [facility], [address], [place], [place] [address]  Urinalysis, Routine w reflex microscopic -Urine, Clean Catch     Status: Abnormal   Collection Time: 07/19/23  5:45 PM  Result Value Ref Range   Color, Urine YELLOW YELLOW   APPearance HAZY (A) CLEAR   Specific Gravity, Urine 1.021 1.005 - 1.030   pH 5.0 5.0 - 8.0   Glucose, UA NEGATIVE NEGATIVE mg/dL   Hgb urine dipstick NEGATIVE NEGATIVE   Bilirubin Urine NEGATIVE NEGATIVE   Ketones, ur NEGATIVE NEGATIVE mg/dL   Protein, ur 30 (A) NEGATIVE mg/dL   Nitrite POSITIVE (A) NEGATIVE   Leukocytes,Ua LARGE (A) NEGATIVE   RBC / HPF 21-50 0 - 5 RBC/hpf   WBC, UA >50 0 - 5 WBC/hpf   Bacteria, UA MANY (A) NONE SEEN   Squamous Epithelial / HPF 0-5 0 - 5 /HPF   Mucus PRESENT    Hyaline Casts, UA PRESENT     Comment: Performed at [facility], [address], [place], [place] [address]  Basic metabolic panel     Status: Abnormal   Collection Time: 07/20/23  5:16 AM  Result Value Ref Range   Sodium 140 135 - 145 mmol/L   Potassium 3.7 3.5 - 5.1 mmol/L   Chloride 103 98 - 111 mmol/L   CO2 27 22 - 32 mmol/L   Glucose, Bld 100 (H) 70 - 99 mg/dL    Comment: Glucose reference range applies only to samples taken after fasting for at least 8 hours.  BUN 21 8 - 23 mg/dL   Creatinine, Ser 1.61  0.44 - 1.00 mg/dL   Calcium 9.9 8.9 - 09.6 mg/dL   GFR, Estimated >04 >54 mL/min    Comment: (NOTE) Calculated using the CKD-EPI Creatinine Equation (2021)    Anion gap 10 5 - 15    Comment: Performed at [facility], [address] [place], [place] [address]  Magnesium     Status: None   Collection Time: 07/20/23  5:16 AM  Result Value Ref Range   Magnesium 2.2 1.7 - 2.4 mg/dL    Comment: Performed at [facility], [address], [place], [place] [address]  CBC     Status: Abnormal   Collection Time: 07/20/23  5:16 AM  Result Value Ref Range   WBC 14.5 (H) 4.0 - 10.5 K/uL   RBC 3.59 (L) 3.87 - 5.11 MIL/uL   Hemoglobin 10.2 (L) 12.0 - 15.0 g/dL   HCT 29.5 (L) 62.1 - 30.8 %   MCV 90.3 80.0 - 100.0 fL   MCH 28.4 26.0 - 34.0 pg   MCHC 31.5 30.0 - 36.0 g/dL   RDW 65.7 84.6 - 96.2 %   Platelets 570 (H) 150 - 400 K/uL   nRBC 0.0 0.0 - 0.2 %    Comment: Performed at [facility], [address], [place], [place] [address]    CT ABDOMEN PELVIS W CONTRAST Result Date: 07/19/2023 CLINICAL DATA:  History of diverticulitis with known air in the urinary bladder on prior imaging, evaluate for possible colovesical fistula. EXAM: CT ABDOMEN AND PELVIS WITH CONTRAST TECHNIQUE: Multidetector CT imaging of the abdomen and pelvis was performed using the standard protocol following bolus administration of intravenous contrast. Additionally rectal contrast was administered. RADIATION DOSE REDUCTION: This exam was performed according to the departmental dose-optimization program which includes automated exposure control, adjustment of the mA and/or kV according to patient size and/or use of iterative reconstruction technique. CONTRAST:  OMNIPAQUE IOHEXOL 300 MG/ML SOLN, 30mL OMNIPAQUE IOHEXOL 300 MG/ML SOLN COMPARISON:  07/10/2023 FINDINGS: Lower chest: No acute abnormality. Hepatobiliary: Gallbladder is within normal limits. Liver demonstrates  multiple cysts simple in appearance and stable from the prior exam. Pancreas: Unremarkable. No pancreatic ductal dilatation or surrounding inflammatory changes. Spleen: Normal in size without focal abnormality. Adrenals/Urinary Tract: Adrenal glands are within normal limits. Kidneys demonstrate a normal enhancement pattern bilaterally. A few small scattered cysts are seen. No follow-up is recommended. No calculi or obstructive changes are noted. The bladder is partially distended again with a small amount of air within. Anterior to superior aspect of the bladder in the midline is a small fluid collection which previously showed air within likely representing a small abscess. Stomach/Bowel: Rectal contrast has been administered. This outlines the colonic lumen and changes of diverticulitis which persist in the sigmoid colon. Wall thickening and pericolonic inflammatory changes are again noted. The collection of air along the left side of the sigmoid colon in the pelvis seen on the prior exam is less well visualized now with a more solid area of inflammatory change without air within. No extension of contrast material into this area is identified. Additionally no extravasation of contrast is seen to delineate a colovesical fistula. More proximal colon shows no significant inflammatory changes. No obstructive changes are noted. The appendix is within normal limits. Small bowel and stomach are unremarkable. Vascular/Lymphatic: Aortic atherosclerosis. No enlarged abdominal or pelvic lymph nodes. Reproductive: Uterus has been surgically removed. Air is again  noted in the vaginal vault although no findings to suggest colo-vaginal fistula are seen. Other: No abdominal wall hernia or abnormality. No abdominopelvic ascites. Musculoskeletal: No acute or significant osseous findings. IMPRESSION: Persistent changes of diverticulitis although the degree of extraluminal air has improved when compared with the prior exam. No areas  of extravasation of contrast from the rectosigmoid are seen to delineate a colovesical or colovaginal fistula. Persistent areas of inflammatory change are noted along the left wall of the sigmoid and along the superior aspect of the urinary bladder which again may represent small abscesses although not drainable at this time. Again no filling of contrast in these areas is noted to suggest fistulization. Electronically Signed   By: Alcide Clever M.D.   On: 07/19/2023 21:01    Review of Systems  Gastrointestinal:  Positive for abdominal pain.  Genitourinary:  Negative for difficulty urinating, dysuria, hematuria, urgency, vaginal discharge and vaginal pain.  Musculoskeletal:  Positive for back pain.  Neurological: Negative.   Hematological: Negative.   Psychiatric/Behavioral: Negative.     Blood pressure 112/65, pulse 80, temperature 97.9 F (36.6 C), temperature source Oral, resp. rate 16, SpO2 99%. Physical Exam Vitals reviewed.  Cardiovascular:     Rate and Rhythm: Normal rate.  Pulmonary:     Effort: Pulmonary effort is normal.  Abdominal:     General: Bowel sounds are increased.     Tenderness: There is abdominal tenderness in the left upper quadrant. There is no guarding or rebound.  Neurological:     Mental Status: She is alert.     Assessment/Plan: Acute on chronic diverticulitis.  I see no evidence of fistula or any significant abscess  Suspect she has smoldering diverticulitis and may benefit from consideration of surgical therapy.  Ideally, this episode could be cleared and she can be seen as an outpatient for a 1 stage procedure.  We discussed both 1 stage and two-stage procedures and colostomy.  There is no acute surgical need.  If she improves, I will have her referred to one of our colorectal specialist as an outpatient.  If she fails medical management, she may require surgery during this admission.  Okay to allow diet at this point in time.  Sheila Lin A Sheila Lin 07/20/2023,  10:51 AM   High complexity

## 2023-07-20 NOTE — Plan of Care (Signed)
Pt on clear liquid diet. Abdominal pain managed by PRN medication. Continue course of antibiotic.  Problem: Education: Goal: Knowledge of General Education information will improve Description: Including pain rating scale, medication(s)/side effects and non-pharmacologic comfort measures Outcome: Progressing   Problem: Health Behavior/Discharge Planning: Goal: Ability to manage health-related needs will improve Outcome: Progressing   Problem: Clinical Measurements: Goal: Ability to maintain clinical measurements within normal limits will improve Outcome: Progressing Goal: Will remain free from infection Outcome: Progressing Goal: Diagnostic test results will improve Outcome: Progressing Goal: Respiratory complications will improve Outcome: Progressing Goal: Cardiovascular complication will be avoided Outcome: Progressing   Problem: Activity: Goal: Risk for activity intolerance will decrease Outcome: Progressing   Problem: Nutrition: Goal: Adequate nutrition will be maintained Outcome: Progressing   Problem: Coping: Goal: Level of anxiety will decrease Outcome: Progressing   Problem: Elimination: Goal: Will not experience complications related to bowel motility Outcome: Progressing Goal: Will not experience complications related to urinary retention Outcome: Progressing   Problem: Pain Managment: Goal: General experience of comfort will improve and/or be controlled Outcome: Progressing   Problem: Safety: Goal: Ability to remain free from injury will improve Outcome: Progressing   Problem: Skin Integrity: Goal: Risk for impaired skin integrity will decrease Outcome: Progressing

## 2023-07-20 NOTE — ED Notes (Signed)
ED TO INPATIENT HANDOFF REPORT  Name/Age/Gender Sheila Lin 63 y.o. female  Code Status    Code Status Orders  (From admission, onward)           Start     Ordered   07/19/23 2301  Full code  Continuous       Question:  By:  Answer:  Consent: discussion documented in EHR   07/19/23 2302           Code Status History     Date Active Date Inactive Code Status Order ID Comments User Context   10/06/2018 0627 10/06/2018 1337 Full Code [id number]  Meisinger, [name], MD Inpatient       Home/SNF/Other Home  Chief Complaint Diverticulitis large intestine [K57.32]  Level of Care/Admitting Diagnosis ED Disposition     ED Disposition  Admit   Condition  --   Comment  Hospital Area: [facility] [[id number]]  Level of Care: Med-Surg [16]  May admit patient to [facility] or [facility] if equivalent level of care is available:: Yes  Covid Evaluation: Asymptomatic - no recent exposure (last 10 days) testing not required  Diagnosis: Diverticulitis large intestine [657846]  Admitting Physician: Briscoe Deutscher [[id number]]  Attending Physician: Briscoe Deutscher [[id number]]  Certification:: I certify this patient will need inpatient services for at least 2 midnights  Expected Medical Readiness: 07/22/2023          Medical History Past Medical History:  Diagnosis Date   Anemia    Diverticulitis    History of bulimia    LBP (low back pain)    Palpitations 2011   [facility]    Allergies Allergies  Allergen Reactions   Fish Oil     REACTION: GERD   Tramadol     itching    IV Location/Drains/Wounds Patient Lines/Drains/Airways Status     Active Line/Drains/Airways     Name Placement date Placement time Site Days   Peripheral IV 07/19/23 20 G 1" Left Antecubital 07/19/23  1803  Antecubital  1   Incision - 3 Ports Abdomen Umbilicus Right;Lateral Left;Lateral 10/06/18  0739  -- 1748            Labs/Imaging Results for orders placed  or performed during the hospital encounter of 07/19/23 (from the past 48 hours)  Comprehensive metabolic panel     Status: Abnormal   Collection Time: 07/19/23  5:37 PM  Result Value Ref Range   Sodium 135 135 - 145 mmol/L   Potassium 3.7 3.5 - 5.1 mmol/L   Chloride 101 98 - 111 mmol/L   CO2 25 22 - 32 mmol/L   Glucose, Bld 119 (H) 70 - 99 mg/dL    Comment: Glucose reference range applies only to samples taken after fasting for at least 8 hours.   BUN 30 (H) 8 - 23 mg/dL   Creatinine, Ser 1.02 0.44 - 1.00 mg/dL   Calcium 9.7 8.9 - 72.5 mg/dL   Total Protein 7.8 6.5 - 8.1 g/dL   Albumin 3.6 3.5 - 5.0 g/dL   AST 12 (L) 15 - 41 U/L   ALT 13 0 - 44 U/L   Alkaline Phosphatase 79 38 - 126 U/L   Total Bilirubin 0.3 0.0 - 1.2 mg/dL   GFR, Estimated >36 >64 mL/min    Comment: (NOTE) Calculated using the CKD-EPI Creatinine Equation (2021)    Anion gap 9 5 - 15    Comment: Performed at [facility], [address], [place], [place] [address]  Lipase, blood     Status: None   Collection Time: 07/19/23  5:37 PM  Result Value Ref Range   Lipase 27 11 - 51 U/L    Comment: Performed at [facility], [address] [place], [place] [address]  CBC with Diff     Status: Abnormal   Collection Time: 07/19/23  5:37 PM  Result Value Ref Range   WBC 14.3 (H) 4.0 - 10.5 K/uL   RBC 3.56 (L) 3.87 - 5.11 MIL/uL   Hemoglobin 10.0 (L) 12.0 - 15.0 g/dL   HCT 09.8 (L) 11.9 - 14.7 %   MCV 90.2 80.0 - 100.0 fL   MCH 28.1 26.0 - 34.0 pg   MCHC 31.2 30.0 - 36.0 g/dL   RDW 82.9 56.2 - 13.0 %   Platelets 616 (H) 150 - 400 K/uL   nRBC 0.0 0.0 - 0.2 %   Neutrophils Relative % 64 %   Neutro Abs 9.2 (H) 1.7 - 7.7 K/uL   Lymphocytes Relative 24 %   Lymphs Abs 3.5 0.7 - 4.0 K/uL   Monocytes Relative 7 %   Monocytes Absolute 0.9 0.1 - 1.0 K/uL   Eosinophils Relative 3 %   Eosinophils Absolute 0.4 0.0 - 0.5 K/uL   Basophils Relative 1 %   Basophils Absolute 0.2 (H) 0.0 -  0.1 K/uL   Immature Granulocytes 1 %   Abs Immature Granulocytes 0.08 (H) 0.00 - 0.07 K/uL    Comment: Performed at [facility], [address], [place], [place] [address]  Urinalysis, Routine w reflex microscopic -Urine, Clean Catch     Status: Abnormal   Collection Time: 07/19/23  5:45 PM  Result Value Ref Range   Color, Urine YELLOW YELLOW   APPearance HAZY (A) CLEAR   Specific Gravity, Urine 1.021 1.005 - 1.030   pH 5.0 5.0 - 8.0   Glucose, UA NEGATIVE NEGATIVE mg/dL   Hgb urine dipstick NEGATIVE NEGATIVE   Bilirubin Urine NEGATIVE NEGATIVE   Ketones, ur NEGATIVE NEGATIVE mg/dL   Protein, ur 30 (A) NEGATIVE mg/dL   Nitrite POSITIVE (A) NEGATIVE   Leukocytes,Ua LARGE (A) NEGATIVE   RBC / HPF 21-50 0 - 5 RBC/hpf   WBC, UA >50 0 - 5 WBC/hpf   Bacteria, UA MANY (A) NONE SEEN   Squamous Epithelial / HPF 0-5 0 - 5 /HPF   Mucus PRESENT    Hyaline Casts, UA PRESENT     Comment: Performed at [facility], [address], [place], [place] [address]  Basic metabolic panel     Status: Abnormal   Collection Time: 07/20/23  5:16 AM  Result Value Ref Range   Sodium 140 135 - 145 mmol/L   Potassium 3.7 3.5 - 5.1 mmol/L   Chloride 103 98 - 111 mmol/L   CO2 27 22 - 32 mmol/L   Glucose, Bld 100 (H) 70 - 99 mg/dL    Comment: Glucose reference range applies only to samples taken after fasting for at least 8 hours.   BUN 21 8 - 23 mg/dL   Creatinine, Ser 9.52 0.44 - 1.00 mg/dL   Calcium 9.9 8.9 - 84.1 mg/dL   GFR, Estimated >32 >44 mL/min    Comment: (NOTE) Calculated using the CKD-EPI Creatinine Equation (2021)    Anion gap 10 5 - 15    Comment: Performed at [facility], [address], [place], [place] [address]  Magnesium  Status: None   Collection Time: 07/20/23  5:16 AM  Result Value Ref Range   Magnesium 2.2 1.7 - 2.4 mg/dL    Comment: Performed at [facility], [address] [place], [place]  [address]  CBC     Status: Abnormal   Collection Time: 07/20/23  5:16 AM  Result Value Ref Range   WBC 14.5 (H) 4.0 - 10.5 K/uL   RBC 3.59 (L) 3.87 - 5.11 MIL/uL   Hemoglobin 10.2 (L) 12.0 - 15.0 g/dL   HCT 60.4 (L) 54.0 - 98.1 %   MCV 90.3 80.0 - 100.0 fL   MCH 28.4 26.0 - 34.0 pg   MCHC 31.5 30.0 - 36.0 g/dL   RDW 19.1 47.8 - 29.5 %   Platelets 570 (H) 150 - 400 K/uL   nRBC 0.0 0.0 - 0.2 %    Comment: Performed at [facility], [address] [place], [place] [address]   CT ABDOMEN PELVIS W CONTRAST Result Date: 07/19/2023 CLINICAL DATA:  History of diverticulitis with known air in the urinary bladder on prior imaging, evaluate for possible colovesical fistula. EXAM: CT ABDOMEN AND PELVIS WITH CONTRAST TECHNIQUE: Multidetector CT imaging of the abdomen and pelvis was performed using the standard protocol following bolus administration of intravenous contrast. Additionally rectal contrast was administered. RADIATION DOSE REDUCTION: This exam was performed according to the departmental dose-optimization program which includes automated exposure control, adjustment of the mA and/or kV according to patient size and/or use of iterative reconstruction technique. CONTRAST:  OMNIPAQUE IOHEXOL 300 MG/ML SOLN, 30mL OMNIPAQUE IOHEXOL 300 MG/ML SOLN COMPARISON:  07/10/2023 FINDINGS: Lower chest: No acute abnormality. Hepatobiliary: Gallbladder is within normal limits. Liver demonstrates multiple cysts simple in appearance and stable from the prior exam. Pancreas: Unremarkable. No pancreatic ductal dilatation or surrounding inflammatory changes. Spleen: Normal in size without focal abnormality. Adrenals/Urinary Tract: Adrenal glands are within normal limits. Kidneys demonstrate a normal enhancement pattern bilaterally. A few small scattered cysts are seen. No follow-up is recommended. No calculi or obstructive changes are noted. The bladder is partially distended again with a small amount  of air within. Anterior to superior aspect of the bladder in the midline is a small fluid collection which previously showed air within likely representing a small abscess. Stomach/Bowel: Rectal contrast has been administered. This outlines the colonic lumen and changes of diverticulitis which persist in the sigmoid colon. Wall thickening and pericolonic inflammatory changes are again noted. The collection of air along the left side of the sigmoid colon in the pelvis seen on the prior exam is less well visualized now with a more solid area of inflammatory change without air within. No extension of contrast material into this area is identified. Additionally no extravasation of contrast is seen to delineate a colovesical fistula. More proximal colon shows no significant inflammatory changes. No obstructive changes are noted. The appendix is within normal limits. Small bowel and stomach are unremarkable. Vascular/Lymphatic: Aortic atherosclerosis. No enlarged abdominal or pelvic lymph nodes. Reproductive: Uterus has been surgically removed. Air is again noted in the vaginal vault although no findings to suggest colo-vaginal fistula are seen. Other: No abdominal wall hernia or abnormality. No abdominopelvic ascites. Musculoskeletal: No acute or significant osseous findings. IMPRESSION: Persistent changes of diverticulitis although the degree of extraluminal air has improved when compared with the prior exam. No areas of extravasation of contrast from the rectosigmoid are seen to delineate a colovesical or colovaginal fistula. Persistent areas of inflammatory change are noted  along the left wall of the sigmoid and along the superior aspect of the urinary bladder which again may represent small abscesses although not drainable at this time. Again no filling of contrast in these areas is noted to suggest fistulization. Electronically Signed   By: Alcide Clever M.D.   On: 07/19/2023 21:01    Pending Labs Unresulted Labs  (From admission, onward)     Start     Ordered   07/26/23 0500  Creatinine, serum  (enoxaparin (LOVENOX)    CrCl >/= 30 ml/min)  Weekly,   R     Comments: while on enoxaparin therapy    07/19/23 2302   07/21/23 0500  Reticulocytes  Tomorrow morning,   R        07/20/23 0907   07/21/23 0500  Ferritin  Tomorrow morning,   R        07/20/23 0907   07/21/23 0500  Iron and TIBC  Tomorrow morning,   R        07/20/23 0907   07/20/23 0500  HIV Antibody (routine testing w rflx)  (HIV Antibody (Routine testing w reflex) panel)  Tomorrow morning,   R        07/19/23 2302   07/20/23 0500  Basic metabolic panel  Daily,   R      07/19/23 2302   07/20/23 0500  CBC  Daily,   R      07/19/23 2302   07/19/23 2224  Urine Culture  Once,   URGENT       Question:  Indication  Answer:  Suprapubic pain   07/19/23 2224            Vitals/Pain Today's Vitals   07/20/23 0900 07/20/23 1006 07/20/23 1014 07/20/23 1100  BP: (!) 115/56 (!) 117/100 112/65 117/74  Pulse: 78  80 90  Resp: 18  16 16   Temp:   97.9 F (36.6 C)   TempSrc:   Oral   SpO2: 97%  99% 97%  PainSc:        Isolation Precautions No active isolations  Medications Medications  piperacillin-tazobactam (ZOSYN) IVPB 3.375 g (3.375 g Intravenous New Bag/Given 07/20/23 1039)  ondansetron (ZOFRAN) injection 4 mg (has no administration in time range)  fentaNYL (SUBLIMAZE) injection 25-50 mcg (has no administration in time range)  enoxaparin (LOVENOX) injection 40 mg (40 mg Subcutaneous Given 07/20/23 1038)  sodium chloride flush (NS) 0.9 % injection 3 mL (3 mLs Intravenous Given 07/20/23 1039)  acetaminophen (TYLENOL) tablet 650 mg (has no administration in time range)    Or  acetaminophen (TYLENOL) suppository 650 mg (has no administration in time range)  oxyCODONE (Oxy IR/ROXICODONE) immediate release tablet 5 mg (5 mg Oral Given 07/20/23 0601)  atenolol (TENORMIN) tablet 50 mg (50 mg Oral Given 07/20/23 1038)  piperacillin-tazobactam  (ZOSYN) IVPB 3.375 g (0 g Intravenous Stopped 07/19/23 1858)  iohexol (OMNIPAQUE) 300 MG/ML solution 25 mL (30 mLs Oral Contrast Given 07/19/23 2026)  iohexol (OMNIPAQUE) 300 MG/ML solution 100 mL (100 mLs Intravenous Contrast Given 07/19/23 2026)    Mobility walks

## 2023-07-20 NOTE — Assessment & Plan Note (Signed)
Mild anemia. - Chek iron stores, retics

## 2023-07-20 NOTE — Consult Note (Signed)
Consultation  Referring Provider:     Joen Laura, MD Primary Care Physician:  Tresa Garter, MD Primary Gastroenterologist:       Dr. Meridee Score Reason for Consultation:     Diverticulitis, LLQ pain         HPI:   CELENA LANIUS is a 63 y.o. female w/ a history of Arthritis, DJD (previously on opioid therapy), prior shingles, thrombocytosis, family history gastric cancer, diverticulosis, hysterectomy, initially seen in the GI clinic on 05/28/2023 for evaluation of LLQ pain, weight loss, sitophobia/anorexia, and abnormal imaging as outlined below.  - 03/27/2023: WBC 13.4, H/H10.5/33, PLT 504 - 05/22/2023: CT A/P: Irregular wall thickening involving a 12.7 cm length of sigmoid colon with adjacent stranding, highly concerning for colonic malignancy. Left external iliac lymph node measures 2.5 x 1.9 cm, suspicious for nodal metastasis. - 05/28/2023: Initial evaluation in the GI clinic.  Started on Cipro/Flagyl and scheduled for expedited colonoscopy.  CEA normal.  WBC 18.6, H/H 9.7/29.8, PLT 712 -05/30/2023: Colonoscopy: 2 subcentimeter adenomas, left-sided diverticulosis with diffuse moderate inflammation in the rectosigmoid colon and distal sigmoid colon with slight narrowing but easily traversable (path: Focal acute colitis with normal crypt architecture and no granulomas).  Mucosa throughout the remainder of the colon was otherwise normal.  Internal/external hemorrhoids, normal TI. - 07/10/2023: CT A/P: Left-sided diverticular disease with redemonstration long segment irregular wall thickening involving sigmoid colon with considerable inflammatory changes.  New small gas and fluid collection left pelvis measuring 3.2 x 0.4 cm may be contiguous with small gas tracking towards junction of the descending colon and sigmoid colon and also communicating with sigmoid colon.  Soft tissue density in the region measuring 3 x 1.5 cm which could reflect enlarged node or phlegmon.  New gas  collection within the bladder, irregular gas and fluid collection superior to the bladder measuring 2.5 x 2.3 cm which may be contiguous with inflamed sigmoid.  New small volume gas in vagina.  Indistinct fat plane between vaginal cuff and inflamed sigmoid colon.  Differential includes colitis, long segment diverticulitis, neoplasm with possibility of colocolonic fistula, small abscess, along with possible colovaginal and/or colovesicular fistula.  She contacted yesterday regarding her most recent CT findings as above and told to come into the ER for expedited evaluation.  She states she is actually currently without any abdominal pain.  Symptoms have been improving over the last several days.  Has been tolerating p.o. intake prior to arrival.  Was prescribed another course of Cipro/Flagyl earlier this week, and she picked up from the pharmacy but had not yet started it.   - WBC 14.3, H/H 10/32, PLT 616 (all largely improved from previous).  Repeat today stable at 14.11/02/30/570, respectively - BUN 30, otherwise normal CMP - Normal lipase - UA with positive nitrates, large leukocytes, many bacteria, 30 protein.  Urine culture pending - CT A/P with rectal contrast: Persistent changes of diverticulitis although the degree of extraluminal air has improved.  No areas of extravasation of contrast from rectosigmoid are seen to delineate a colovesical or colovaginal fistula.  Persistent areas of inflammatory change noted along the left wall of the sigmoid and along the superior aspect of the urinary bladder which may again represent small abscess although not drainable.  No filling of contrast in these areas to suggest fistulization.  Past Medical History:  Diagnosis Date   Anemia    Diverticulitis    History of bulimia    LBP (low back pain)    Palpitations  2011   [facility]    Past Surgical History:  Procedure Laterality Date   ABDOMINAL HYSTERECTOMY     BIOPSY  05/30/2023   Procedure: BIOPSY;   Surgeon: Lemar Lofty., MD;  Location: [facility];  Service: Gastroenterology;;   COLONOSCOPY     COLONOSCOPY WITH PROPOFOL N/A 05/30/2023   Procedure: COLONOSCOPY WITH PROPOFOL;  Surgeon: Lemar Lofty., MD;  Location: [facility];  Service: Gastroenterology;  Laterality: N/A;   etopic pregnancy     LAPAROSCOPIC BILATERAL SALPINGO OOPHERECTOMY Right 10/06/2018   Procedure: LAPAROSCOPIC  RIGHT  SALPINGO OOPHORECTOMY;  Surgeon: Lavina Hamman, MD;  Location: [facility];  Service: Gynecology;  Laterality: Right;   POLYPECTOMY  05/30/2023   Procedure: POLYPECTOMY;  Surgeon: Mansouraty, [name]., MD;  Location: [facility];  Service: Gastroenterology;;    Family History  Problem Relation Age of Onset   Thyroid cancer Mother    Cancer Mother 43       sarcoma   Stomach cancer Mother    Colon cancer Neg Hx    Esophageal cancer Neg Hx    Inflammatory bowel disease Neg Hx    Liver disease Neg Hx    Pancreatic cancer Neg Hx    Rectal cancer Neg Hx      Social History   Tobacco Use   Smoking status: Never   Smokeless tobacco: Never  Vaping Use   Vaping status: Never Used  Substance Use Topics   Alcohol use: No   Drug use: No    Prior to Admission medications   Medication Sig Start Date End Date Taking? Authorizing Provider  aspirin EC 81 MG tablet Take 1 tablet (81 mg total) by mouth daily. 01/18/23 01/18/24 Yes [name], [name], MD  atenolol (TENORMIN) 50 MG tablet Take 1 tablet (50 mg total) by mouth daily. 10/22/22  Yes [name], [name], MD  Cholecalciferol (VITAMIN D3) 2000 units capsule Take 1 capsule (2,000 Units total) by mouth daily. 03/03/18  Yes [name], [name], MD  ciprofloxacin (CIPRO) 500 MG tablet Take 1 tablet (500 mg total) by mouth 2 (two) times daily for 14 days. 07/19/23 08/02/23 Yes Nandigam, Eleonore Chiquito, MD  metroNIDAZOLE (FLAGYL) 250 MG tablet Take 2 tablets (500 mg total) by mouth 2 (two) times daily for 14 days.  07/19/23 08/02/23 Yes Nandigam, Eleonore Chiquito, MD  MULTIPLE VITAMIN PO Take 1 tablet by mouth daily.   Yes [provider]    Current Facility-Administered Medications  Medication Dose Route Frequency Provider Last Rate Last Admin   acetaminophen (TYLENOL) tablet 650 mg  650 mg Oral Q6H PRN Opyd, Lavone Neri, MD       Or   acetaminophen (TYLENOL) suppository 650 mg  650 mg Rectal Q6H PRN Opyd, Lavone Neri, MD       enoxaparin (LOVENOX) injection 40 mg  40 mg Subcutaneous Q24H Opyd, Lavone Neri, MD       fentaNYL (SUBLIMAZE) injection 25-50 mcg  25-50 mcg Intravenous Once PRN Opyd, Lavone Neri, MD       lactated ringers infusion   Intravenous Continuous Opyd, Lavone Neri, MD 75 mL/hr at 07/19/23 2331 New Bag at 07/19/23 2331   ondansetron (ZOFRAN) injection 4 mg  4 mg Intravenous Q6H PRN Opyd, Lavone Neri, MD       oxyCODONE (Oxy IR/ROXICODONE) immediate release tablet 5 mg  5 mg Oral Q4H PRN Opyd, Lavone Neri, MD   5 mg at 07/20/23 0601   piperacillin-tazobactam (ZOSYN) IVPB 3.375 g  3.375 g Intravenous Q8H Opyd, Lavone Neri, MD 12.5 mL/hr at 07/20/23 0216 3.375 g at 07/20/23 0216   sodium chloride flush (NS) 0.9 % injection 3 mL  3 mL Intravenous Q12H Opyd, Lavone Neri, MD   3 mL at 07/19/23 2332   Current Outpatient Medications  Medication Sig Dispense Refill   aspirin EC 81 MG tablet Take 1 tablet (81 mg total) by mouth daily.     atenolol (TENORMIN) 50 MG tablet Take 1 tablet (50 mg total) by mouth daily. 90 tablet 3   Cholecalciferol (VITAMIN D3) 2000 units capsule Take 1 capsule (2,000 Units total) by mouth daily. 100 capsule 3   ciprofloxacin (CIPRO) 500 MG tablet Take 1 tablet (500 mg total) by mouth 2 (two) times daily for 14 days. 28 tablet 0   metroNIDAZOLE (FLAGYL) 250 MG tablet Take 2 tablets (500 mg total) by mouth 2 (two) times daily for 14 days. 56 tablet 0   MULTIPLE VITAMIN PO Take 1 tablet by mouth daily.      Allergies as of 07/19/2023 - Review Complete 07/19/2023  Allergen Reaction  Noted   Fish oil  03/17/2010   Tramadol  06/22/2015     Review of Systems:    As per HPI, otherwise negative    Physical Exam:  Vital signs in last 24 hours: Temp:  [98 F (36.7 C)-98.6 F (37 C)] 98 F (36.7 C) (01/25 4098) Pulse Rate:  [70-87] 78 (01/25 0633) Resp:  [16-18] 18 (01/25 1191) BP: (105-122)/(59-74) 118/72 (01/25 4782) SpO2:  [96 %-99 %] 99 % (01/25 9562)   General:   Pleasant female in NAD, sitting upright and eating Jell-O Head:  Normocephalic and atraumatic. Lungs:  Respirations even and unlabored. Lungs clear to auscultation bilaterally.   No wheezes, crackles, or rhonchi.  Heart:  Regular rate and rhythm; no MRG Abdomen: Mild TTP in LLQ, but no rebound, guarding, peritoneal signs.  Abdomen otherwise soft, nondistended. Normal bowel sounds.  Neurologic:  Alert and  oriented x4;  grossly normal neurologically.   LAB RESULTS: Recent Labs    07/19/23 1737 07/20/23 0516  WBC 14.3* 14.5*  HGB 10.0* 10.2*  HCT 32.1* 32.4*  PLT 616* 570*   BMET Recent Labs    07/19/23 1737 07/20/23 0516  NA 135 140  K 3.7 3.7  CL 101 103  CO2 25 27  GLUCOSE 119* 100*  BUN 30* 21  CREATININE 0.69 0.66  CALCIUM 9.7 9.9   LFT Recent Labs    07/19/23 1737  PROT 7.8  ALBUMIN 3.6  AST 12*  ALT 13  ALKPHOS 79  BILITOT 0.3   PT/INR No results for input(s): "LABPROT", "INR" in the last 72 hours.  STUDIES: CT ABDOMEN PELVIS W CONTRAST Result Date: 07/19/2023 CLINICAL DATA:  History of diverticulitis with known air in the urinary bladder on prior imaging, evaluate for possible colovesical fistula. EXAM: CT ABDOMEN AND PELVIS WITH CONTRAST TECHNIQUE: Multidetector CT imaging of the abdomen and pelvis was performed using the standard protocol following bolus administration of intravenous contrast. Additionally rectal contrast was administered. RADIATION DOSE REDUCTION: This exam was performed according to the departmental dose-optimization program which includes  automated exposure control, adjustment of the mA and/or kV according to patient size and/or use of iterative reconstruction technique. CONTRAST:  OMNIPAQUE IOHEXOL 300 MG/ML SOLN, 30mL OMNIPAQUE IOHEXOL 300 MG/ML SOLN COMPARISON:  07/10/2023 FINDINGS: Lower chest: No acute abnormality. Hepatobiliary: Gallbladder is within normal limits. Liver demonstrates multiple cysts simple in appearance and stable  from the prior exam. Pancreas: Unremarkable. No pancreatic ductal dilatation or surrounding inflammatory changes. Spleen: Normal in size without focal abnormality. Adrenals/Urinary Tract: Adrenal glands are within normal limits. Kidneys demonstrate a normal enhancement pattern bilaterally. A few small scattered cysts are seen. No follow-up is recommended. No calculi or obstructive changes are noted. The bladder is partially distended again with a small amount of air within. Anterior to superior aspect of the bladder in the midline is a small fluid collection which previously showed air within likely representing a small abscess. Stomach/Bowel: Rectal contrast has been administered. This outlines the colonic lumen and changes of diverticulitis which persist in the sigmoid colon. Wall thickening and pericolonic inflammatory changes are again noted. The collection of air along the left side of the sigmoid colon in the pelvis seen on the prior exam is less well visualized now with a more solid area of inflammatory change without air within. No extension of contrast material into this area is identified. Additionally no extravasation of contrast is seen to delineate a colovesical fistula. More proximal colon shows no significant inflammatory changes. No obstructive changes are noted. The appendix is within normal limits. Small bowel and stomach are unremarkable. Vascular/Lymphatic: Aortic atherosclerosis. No enlarged abdominal or pelvic lymph nodes. Reproductive: Uterus has been surgically removed. Air is again noted  in the vaginal vault although no findings to suggest colo-vaginal fistula are seen. Other: No abdominal wall hernia or abnormality. No abdominopelvic ascites. Musculoskeletal: No acute or significant osseous findings. IMPRESSION: Persistent changes of diverticulitis although the degree of extraluminal air has improved when compared with the prior exam. No areas of extravasation of contrast from the rectosigmoid are seen to delineate a colovesical or colovaginal fistula. Persistent areas of inflammatory change are noted along the left wall of the sigmoid and along the superior aspect of the urinary bladder which again may represent small abscesses although not drainable at this time. Again no filling of contrast in these areas is noted to suggest fistulization. Electronically Signed   By: Alcide Clever M.D.   On: 07/19/2023 21:01      Impression / Plan:   1) Diverticulitis 2) LLQ pain 63 year old female with seemingly history of diverticulitis in the past which eventually developed into chronic diverticulitis/segmental colitis.  CT earlier this month with concern for fistulization and abscess.  Thankfully CT on this admission actually shows overall improvement and no clear evidence of fistula.  Recent colonoscopy with left-sided diverticulosis with moderate inflammation in the rectosigmoid/distal sigmoid colon with biopsy showing acute colitis without features of malignancy or dysplasia.  - Continue Zosyn - Agree with General Surgery consult.  Depending on clinical course, may need to consider segmental resection - No role for repeat colonoscopy - GI service will continue to follow   Doristine Locks, DO, [facility]    LOS: 1 day   Shellia Cleverly  07/20/2023, 8:37 AM

## 2023-07-20 NOTE — Assessment & Plan Note (Signed)
Takes atenolol for palpitations only.  No HTN. - Continue atenolol

## 2023-07-20 NOTE — Hospital Course (Signed)
63 y.o. F who presented with ongoing abdominal pain and abnormal CT of the colon.  Developed LLQ in Oct 2024, had CT and colonoscopy, appeared to be colitis, treated with antibiotics, no relief.  Pain continued, and so repeat CT was obtained this week that showed ongoing colon thickening, possible infection, also possible fistula.  Sent to the ER for work up.

## 2023-07-21 DIAGNOSIS — K5732 Diverticulitis of large intestine without perforation or abscess without bleeding: Secondary | ICD-10-CM | POA: Diagnosis not present

## 2023-07-21 LAB — BASIC METABOLIC PANEL
Anion gap: 9 (ref 5–15)
BUN: 17 mg/dL (ref 8–23)
CO2: 26 mmol/L (ref 22–32)
Calcium: 10.3 mg/dL (ref 8.9–10.3)
Chloride: 103 mmol/L (ref 98–111)
Creatinine, Ser: 0.46 mg/dL (ref 0.44–1.00)
GFR, Estimated: >60 mL/min (ref >60)
Glucose, Bld: 103 mg/dL — ABNORMAL HIGH (ref 70–99)
Potassium: 4.2 mmol/L (ref 3.5–5.1)
Sodium: 138 mmol/L (ref 135–145)

## 2023-07-21 LAB — CBC
HCT: 34.2 % — ABNORMAL LOW (ref 36.0–46.0)
Hemoglobin: 10.7 g/dL — ABNORMAL LOW (ref 12.0–15.0)
MCH: 27.9 pg (ref 26.0–34.0)
MCHC: 31.3 g/dL (ref 30.0–36.0)
MCV: 89.3 fL (ref 80.0–100.0)
Platelets: 567 10*3/uL — ABNORMAL HIGH (ref 150–400)
RBC: 3.83 MIL/uL — ABNORMAL LOW (ref 3.87–5.11)
RDW: 14.1 % (ref 11.5–15.5)
WBC: 13.9 10*3/uL — ABNORMAL HIGH (ref 4.0–10.5)
nRBC: 0 % (ref 0.0–0.2)

## 2023-07-21 LAB — FERRITIN: Ferritin: 367 ng/mL — ABNORMAL HIGH (ref 11–307)

## 2023-07-21 LAB — URINE CULTURE: Culture: <10000 — AB

## 2023-07-21 LAB — RETICULOCYTES
Immature Retic Fract: 13 % (ref 2.3–15.9)
RBC.: 3.8 MIL/uL — ABNORMAL LOW (ref 3.87–5.11)
Retic Count, Absolute: 54 10*3/uL (ref 19.0–186.0)
Retic Ct Pct: 1.4 % (ref 0.4–3.1)

## 2023-07-21 LAB — IRON AND TIBC
Iron: 44 ug/dL (ref 28–170)
Saturation Ratios: 15 % (ref 10.4–31.8)
TIBC: 302 ug/dL (ref 250–450)
UIBC: 258 ug/dL

## 2023-07-21 MED ORDER — POLYETHYLENE GLYCOL 3350 17 G PO PACK
17.0000 g | PACK | Freq: Two times a day (BID) | ORAL | Status: DC
  Administered 2023-07-21: 17 g via ORAL
  Filled 2023-07-21: qty 1

## 2023-07-21 MED ORDER — POLYETHYLENE GLYCOL 3350 17 G PO PACK: 17.0000 g | PACK | Freq: Two times a day (BID) | ORAL | 0 refills | Status: AC

## 2023-07-21 NOTE — Plan of Care (Signed)
Patient discharge to home. Accompanied by husband. Discharge education done at bedside. Pt verbalized understanding of follow up appointments and medication regimen.  Problem: Education: Goal: Knowledge of General Education information will improve Description: Including pain rating scale, medication(s)/side effects and non-pharmacologic comfort measures Outcome: Adequate for Discharge   Problem: Health Behavior/Discharge Planning: Goal: Ability to manage health-related needs will improve Outcome: Adequate for Discharge   Problem: Clinical Measurements: Goal: Ability to maintain clinical measurements within normal limits will improve Outcome: Adequate for Discharge Goal: Will remain free from infection Outcome: Adequate for Discharge Goal: Diagnostic test results will improve Outcome: Adequate for Discharge Goal: Respiratory complications will improve Outcome: Adequate for Discharge Goal: Cardiovascular complication will be avoided Outcome: Adequate for Discharge   Problem: Activity: Goal: Risk for activity intolerance will decrease Outcome: Adequate for Discharge   Problem: Nutrition: Goal: Adequate nutrition will be maintained Outcome: Adequate for Discharge   Problem: Coping: Goal: Level of anxiety will decrease Outcome: Adequate for Discharge   Problem: Elimination: Goal: Will not experience complications related to bowel motility Outcome: Adequate for Discharge Goal: Will not experience complications related to urinary retention Outcome: Adequate for Discharge   Problem: Pain Managment: Goal: General experience of comfort will improve and/or be controlled Outcome: Adequate for Discharge   Problem: Safety: Goal: Ability to remain free from injury will improve Outcome: Adequate for Discharge   Problem: Skin Integrity: Goal: Risk for impaired skin integrity will decrease Outcome: Adequate for Discharge

## 2023-07-21 NOTE — Progress Notes (Signed)
[facility] ROUNDING NOTE   Subjective: Doing well today.  No acute events overnight.  Denies any pain.  Tolerating liquids without issue.   Objective: Vital signs in last 24 hours: Temp:  [97.7 F (36.5 C)-98.4 F (36.9 C)] 97.7 F (36.5 C) (01/26 0445) Pulse Rate:  [71-90] 77 (01/26 0445) Resp:  [15-20] 20 (01/26 0445) BP: (108-124)/(65-100) 121/72 (01/26 0445) SpO2:  [97 %-100 %] 100 % (01/26 0445) Weight:  [70.8 kg] 70.8 kg (01/25 1315)   General: NAD Abdomen:  Soft, NT, ND, +BS Ext:  No c/c/e    Intake/Output from previous day: 01/25 0701 - 01/26 0700 In: 1155 [P.O.:235; I.V.:720; IV Piggyback:200] Out: -  Intake/Output this shift: No intake/output data recorded.   Lab Results: Recent Labs    07/19/23 1737 07/20/23 0516 07/21/23 0437  WBC 14.3* 14.5* 13.9*  HGB 10.0* 10.2* 10.7*  PLT 616* 570* 567*  MCV 90.2 90.3 89.3   BMET Recent Labs    07/19/23 1737 07/20/23 0516 07/21/23 0437  NA 135 140 138  K 3.7 3.7 4.2  CL 101 103 103  CO2 25 27 26   GLUCOSE 119* 100* 103*  BUN 30* 21 17  CREATININE 0.69 0.66 0.46  CALCIUM 9.7 9.9 10.3   LFT Recent Labs    07/19/23 1737  PROT 7.8  ALBUMIN 3.6  AST 12*  ALT 13  ALKPHOS 79  BILITOT 0.3   PT/INR No results for input(s): "INR" in the last 72 hours.    Imaging/Other results: CT ABDOMEN PELVIS W CONTRAST Result Date: 07/19/2023 CLINICAL DATA:  History of diverticulitis with known air in the urinary bladder on prior imaging, evaluate for possible colovesical fistula. EXAM: CT ABDOMEN AND PELVIS WITH CONTRAST TECHNIQUE: Multidetector CT imaging of the abdomen and pelvis was performed using the standard protocol following bolus administration of intravenous contrast. Additionally rectal contrast was administered. RADIATION DOSE REDUCTION: This exam was performed according to the departmental dose-optimization program which includes automated exposure control, adjustment of the mA and/or kV  according to patient size and/or use of iterative reconstruction technique. CONTRAST:  OMNIPAQUE IOHEXOL 300 MG/ML SOLN, 30mL OMNIPAQUE IOHEXOL 300 MG/ML SOLN COMPARISON:  07/10/2023 FINDINGS: Lower chest: No acute abnormality. Hepatobiliary: Gallbladder is within normal limits. Liver demonstrates multiple cysts simple in appearance and stable from the prior exam. Pancreas: Unremarkable. No pancreatic ductal dilatation or surrounding inflammatory changes. Spleen: Normal in size without focal abnormality. Adrenals/Urinary Tract: Adrenal glands are within normal limits. Kidneys demonstrate a normal enhancement pattern bilaterally. A few small scattered cysts are seen. No follow-up is recommended. No calculi or obstructive changes are noted. The bladder is partially distended again with a small amount of air within. Anterior to superior aspect of the bladder in the midline is a small fluid collection which previously showed air within likely representing a small abscess. Stomach/Bowel: Rectal contrast has been administered. This outlines the colonic lumen and changes of diverticulitis which persist in the sigmoid colon. Wall thickening and pericolonic inflammatory changes are again noted. The collection of air along the left side of the sigmoid colon in the pelvis seen on the prior exam is less well visualized now with a more solid area of inflammatory change without air within. No extension of contrast material into this area is identified. Additionally no extravasation of contrast is seen to delineate a colovesical fistula. More proximal colon shows no significant inflammatory changes. No obstructive changes are noted. The appendix is within normal limits. Small bowel and stomach are unremarkable.  Vascular/Lymphatic: Aortic atherosclerosis. No enlarged abdominal or pelvic lymph nodes. Reproductive: Uterus has been surgically removed. Air is again noted in the vaginal vault although no findings to suggest  colo-vaginal fistula are seen. Other: No abdominal wall hernia or abnormality. No abdominopelvic ascites. Musculoskeletal: No acute or significant osseous findings. IMPRESSION: Persistent changes of diverticulitis although the degree of extraluminal air has improved when compared with the prior exam. No areas of extravasation of contrast from the rectosigmoid are seen to delineate a colovesical or colovaginal fistula. Persistent areas of inflammatory change are noted along the left wall of the sigmoid and along the superior aspect of the urinary bladder which again may represent small abscesses although not drainable at this time. Again no filling of contrast in these areas is noted to suggest fistulization. Electronically Signed   By: Alcide Clever M.D.   On: 07/19/2023 21:01      Assessment and Plan:  1) Diverticulitis Agree with surgical evaluation that likely has developed chronic diverticulitis/segmental colitis.  Continues to improve clinically, and now without any abdominal pain and tolerating p.o. intake without issue. - Convert Zosyn to Cipro/Flagyl x 14 days - Advance diet to soft, and continue slowly advancing as tolerated - Agree with addition of MiraLAX BID started by surgical service this morning.  Titrate to soft stools - Plan for follow-up in the Colorectal Surgery clinic to discuss elective segmental resection - Currently scheduled for repeat CT on 08/01/2023.  Will keep that appointment for the time being, but if seen in the Colorectal Surgery clinic in the meantime and planning on surgery, can likely forego that CT - Anticipate discharge home later today or tomorrow morning provide she is tolerating advancing p.o. intake - I will have our outpatient team reach out to her this week to check in on her.  For now, has follow-up appointment scheduled with Dr. Meridee Score on 08/30/2023.  Will modify to sooner appointment as appropriate    Shellia Cleverly, DO  07/21/2023, 9:28 AM [facility] Pager [phone]

## 2023-07-21 NOTE — Progress Notes (Signed)
Subjective/Chief Complaint: No pain feels constipated    Objective: Vital signs in last 24 hours: Temp:  [97.7 F (36.5 C)-98.4 F (36.9 C)] 97.7 F (36.5 C) (01/26 0445) Pulse Rate:  [71-90] 77 (01/26 0445) Resp:  [15-20] 20 (01/26 0445) BP: (108-124)/(56-100) 121/72 (01/26 0445) SpO2:  [97 %-100 %] 100 % (01/26 0445) Weight:  [70.8 kg] 70.8 kg (01/25 1315)    Intake/Output from previous day: 01/25 0701 - 01/26 0700 In: 1155 [P.O.:235; I.V.:720; IV Piggyback:200] Out: -  Intake/Output this shift: No intake/output data recorded.  GI: soft, non-tender; bowel sounds normal; no masses,  no organomegaly  Lab Results:  Recent Labs    07/20/23 0516 07/21/23 0437  WBC 14.5* 13.9*  HGB 10.2* 10.7*  HCT 32.4* 34.2*  PLT 570* 567*   BMET Recent Labs    07/20/23 0516 07/21/23 0437  NA 140 138  K 3.7 4.2  CL 103 103  CO2 27 26  GLUCOSE 100* 103*  BUN 21 17  CREATININE 0.66 0.46  CALCIUM 9.9 10.3   PT/INR No results for input(s): "LABPROT", "INR" in the last 72 hours. ABG No results for input(s): "PHART", "HCO3" in the last 72 hours.  Invalid input(s): "PCO2", "PO2"  Studies/Results: CT ABDOMEN PELVIS W CONTRAST Result Date: 07/19/2023 CLINICAL DATA:  History of diverticulitis with known air in the urinary bladder on prior imaging, evaluate for possible colovesical fistula. EXAM: CT ABDOMEN AND PELVIS WITH CONTRAST TECHNIQUE: Multidetector CT imaging of the abdomen and pelvis was performed using the standard protocol following bolus administration of intravenous contrast. Additionally rectal contrast was administered. RADIATION DOSE REDUCTION: This exam was performed according to the departmental dose-optimization program which includes automated exposure control, adjustment of the mA and/or kV according to patient size and/or use of iterative reconstruction technique. CONTRAST:  OMNIPAQUE IOHEXOL 300 MG/ML SOLN, 30mL OMNIPAQUE IOHEXOL 300 MG/ML SOLN  COMPARISON:  07/10/2023 FINDINGS: Lower chest: No acute abnormality. Hepatobiliary: Gallbladder is within normal limits. Liver demonstrates multiple cysts simple in appearance and stable from the prior exam. Pancreas: Unremarkable. No pancreatic ductal dilatation or surrounding inflammatory changes. Spleen: Normal in size without focal abnormality. Adrenals/Urinary Tract: Adrenal glands are within normal limits. Kidneys demonstrate a normal enhancement pattern bilaterally. A few small scattered cysts are seen. No follow-up is recommended. No calculi or obstructive changes are noted. The bladder is partially distended again with a small amount of air within. Anterior to superior aspect of the bladder in the midline is a small fluid collection which previously showed air within likely representing a small abscess. Stomach/Bowel: Rectal contrast has been administered. This outlines the colonic lumen and changes of diverticulitis which persist in the sigmoid colon. Wall thickening and pericolonic inflammatory changes are again noted. The collection of air along the left side of the sigmoid colon in the pelvis seen on the prior exam is less well visualized now with a more solid area of inflammatory change without air within. No extension of contrast material into this area is identified. Additionally no extravasation of contrast is seen to delineate a colovesical fistula. More proximal colon shows no significant inflammatory changes. No obstructive changes are noted. The appendix is within normal limits. Small bowel and stomach are unremarkable. Vascular/Lymphatic: Aortic atherosclerosis. No enlarged abdominal or pelvic lymph nodes. Reproductive: Uterus has been surgically removed. Air is again noted in the vaginal vault although no findings to suggest colo-vaginal fistula are seen. Other: No abdominal wall hernia or abnormality. No abdominopelvic ascites. Musculoskeletal: No acute or  significant osseous findings.  IMPRESSION: Persistent changes of diverticulitis although the degree of extraluminal air has improved when compared with the prior exam. No areas of extravasation of contrast from the rectosigmoid are seen to delineate a colovesical or colovaginal fistula. Persistent areas of inflammatory change are noted along the left wall of the sigmoid and along the superior aspect of the urinary bladder which again may represent small abscesses although not drainable at this time. Again no filling of contrast in these areas is noted to suggest fistulization. Electronically Signed   By: Alcide Clever M.D.   On: 07/19/2023 21:01    Anti-infectives: Anti-infectives (From admission, onward)    Start     Dose/Rate Route Frequency Ordered Stop   07/20/23 0200  piperacillin-tazobactam (ZOSYN) IVPB 3.375 g        3.375 g 12.5 mL/hr over 240 Minutes Intravenous Every 8 hours 07/19/23 2258     07/19/23 1730  piperacillin-tazobactam (ZOSYN) IVPB 3.375 g        3.375 g 100 mL/hr over 30 Minutes Intravenous  Once 07/19/23 1719 07/19/23 1858       Assessment/Plan: Chronic diverticulitis  No acute need for surgery Add miralax BID  Continue ABX Can be discharged once she tolerated her diet  Colorectal follow up sent to [facility] office    LOS: 2 days    Dortha Schwalbe MD  07/21/2023 Straight forward

## 2023-07-21 NOTE — TOC CM/SW Note (Signed)
Transition of Care [facility]) - Inpatient Brief Assessment   Patient Details  Name: Sheila Lin MRN: [id number] Date of Birth: 01/07/1961  Transition of Care [facility]) CM/SW Contact:    Darleene Cleaver, LCSW Phone Number: 07/21/2023, 10:51 AM   Clinical Narrative:  Patient has insurance and a PCP.  Patient does not have any SDOH needs, no anticipated TOC needs at this time.   Transition of Care Asessment: Insurance and Status: Insurance coverage has been reviewed Patient has primary care physician: Yes Home environment has been reviewed: Yes lives with spouse Prior level of function:: Independent Prior/Current Home Services: No current home services Social Drivers of Health Review: SDOH reviewed no interventions necessary Readmission risk has been reviewed: Yes Transition of care needs: no transition of care needs at this time

## 2023-07-21 NOTE — Discharge Summary (Signed)
Physician Discharge Summary   Patient: Sheila Lin MRN: [id number] DOB: 02-22-1961  Admit date:     07/19/2023  Discharge date: 07/21/23  Discharge Physician: Alberteen Sam   PCP: Tresa Garter, MD     Recommendations at discharge:  Follow-up with gastroenterology, Dr. Meridee Score as directed for diverticulitis Follow up with [facility] for complicated diverticulitis Complete 14 days Cipro Flagyl Continue soft diet and titrate MiraLAX to soft stools Plan to follow-up in colorectal surgery clinic for possible elective segmental resection for complicated diverticulitis Currently scheduled for repeat CT on 2/6 -- if surgery evaluates patient prior to 2/6 and decision is for surgery, the CT can be canceled     Discharge Diagnoses: Principal Problem:   Diverticulitis large intestine Active Problems:   Normocytic anemia   History of palpitations      Hospital Course: 63 y.o. F who presented with ongoing abdominal pain and abnormal CT of the colon.  Developed LLQ in Oct 2024, had CT and colonoscopy, appeared to be colitis, treated with antibiotics, no relief.  Pain continued, and so repeat CT was obtained this week that showed ongoing colon thickening, possible infection, also possible fistula.  Sent to the ER for work up.      * Diverticulitis large intestine Refer to more complete summary from Dr. Meridee Score from 12/3  In brief, the patient developed progressive sharp lower abdominal pain over the last 3 months.  Colonoscopy showed nonspecific colitis, pathology report not consistent with inflammatory bowel disease, mostly suggest this was a resolving infectious colitis, possibly diverticulitis.  Was treated with antibiotics, but symptoms returned and are now worse.  Had a repeat CT that showed extraluminal fluid collections, possible fistulization.  She was evaluated by general surgery, who felt that her abdomen was benign.  She was  started on IV Zosyn, and her symptoms did improve quite well.  Her diet was advanced to soft diet which she tolerated well, and she was discharged to complete 14 days of Cipro Flagyl.  See follow-up plan above.       Palpitations Takes atenolol for palpitations only.  No HTN.  Normocytic anemia Mild anemia.  Iron stores replete.          The [facility] Controlled Substances Registry was reviewed for this patient prior to discharge.  Consultants:  Neurology, Dr. Barron Alvine General Surgery, Dr. Luisa Hart  Procedures performed:  The abdomen  Disposition: Home Diet recommendation: Soft diet   DISCHARGE MEDICATION: Allergies as of 07/21/2023       Reactions   Fish Oil    REACTION: GERD   Tramadol    itching        Medication List     TAKE these medications    aspirin EC 81 MG tablet Take 1 tablet (81 mg total) by mouth daily.   atenolol 50 MG tablet Commonly known as: TENORMIN Take 1 tablet (50 mg total) by mouth daily.   ciprofloxacin 500 MG tablet Commonly known as: CIPRO Take 1 tablet (500 mg total) by mouth 2 (two) times daily for 14 days.   metroNIDAZOLE 250 MG tablet Commonly known as: FLAGYL Take 2 tablets (500 mg total) by mouth 2 (two) times daily for 14 days.   MULTIPLE VITAMIN PO Take 1 tablet by mouth daily.   polyethylene glycol 17 g packet Commonly known as: MIRALAX / GLYCOLAX Take 17 g by mouth 2 (two) times daily.   Vitamin D3 50 MCG (2000 UT) capsule Take 1 capsule (2,000  Units total) by mouth daily.        Follow-up Information     [facility] Follow up in 1 month(s).   Specialty: General Surgery Contact information: [address] [place] [address] [phone]        Doristine Locks V, DO Follow up.   Specialty: Gastroenterology Contact information: [address] [place] [address] [phone]         [name], [name], MD. Schedule an  appointment as soon as possible for a visit in 1 week(s).   Specialty: Internal Medicine Contact information: [address] [place] [address] [phone]                 Discharge Instructions     Discharge instructions   Complete by: As directed    **IMPORTANT DISCHARGE INSTRUCTIONS**   From Dr. Maryfrances Bunnell: You were admitted for abdominal pain and CT findings of fluid collections around your colon.  Here, you were evaluated by Dr. Barron Alvine [facility]) and Dr. Luisa Hart [facility])  Their joint assessment is that no surgical treatment is urgent, but may be offered electively.  For now, you have improved well  Take the antibiotics ciprofloxacin and metronidazole to complete the course  Take Cipro 500 mg twice daily Take metronidazole/Flagyl 500 mg twice daily  Take both for 14 days, starting tonight   Dr. Mansouraty/Cirigliano's office will contact you this week about follow up.  Contact the General Surgery office (see below in the TO DO section) to follow up with their colorectal surgeon, however you were instructed by Dr. Luisa Hart  Dr. Meridee Score and the Surgery team can decide at a later date whether a repeat CT scan is needed or not  Eat a soft diet for now, until instructed differently by Dr. Posey Rea, Dr. Meridee Score, or you have finished antibiotics This means things like soft breads, rolls, soups and stews, puddings, yogurt, fruit with high fluid content (like banana or melon), Eggs, mashed potatoes, egg noodles, etc.   Take Miralax once or twice daily to keep stools soft   Increase activity slowly   Complete by: As directed        Discharge Exam: Filed Weights   07/20/23 1315  Weight: 70.8 kg    General: Pt is alert, awake, not in acute distress Cardiovascular: RRR, nl S1-S2, no murmurs appreciated.   No LE edema.   Respiratory: Normal respiratory rate and rhythm.  CTAB without rales or wheezes. Abdominal:  Abdomen soft and non-tender.  No distension or HSM.   Neuro/Psych: Strength symmetric in upper and lower extremities.  Judgment and insight appear normal.   Condition at discharge: good  The results of significant diagnostics from this hospitalization (including imaging, microbiology, ancillary and laboratory) are listed below for reference.   Imaging Studies: CT ABDOMEN PELVIS W CONTRAST Result Date: 07/19/2023 CLINICAL DATA:  History of diverticulitis with known air in the urinary bladder on prior imaging, evaluate for possible colovesical fistula. EXAM: CT ABDOMEN AND PELVIS WITH CONTRAST TECHNIQUE: Multidetector CT imaging of the abdomen and pelvis was performed using the standard protocol following bolus administration of intravenous contrast. Additionally rectal contrast was administered. RADIATION DOSE REDUCTION: This exam was performed according to the departmental dose-optimization program which includes automated exposure control, adjustment of the mA and/or kV according to patient size and/or use of iterative reconstruction technique. CONTRAST:  OMNIPAQUE IOHEXOL 300 MG/ML SOLN, 30mL OMNIPAQUE IOHEXOL 300 MG/ML SOLN COMPARISON:  07/10/2023 FINDINGS:  Lower chest: No acute abnormality. Hepatobiliary: Gallbladder is within normal limits. Liver demonstrates multiple cysts simple in appearance and stable from the prior exam. Pancreas: Unremarkable. No pancreatic ductal dilatation or surrounding inflammatory changes. Spleen: Normal in size without focal abnormality. Adrenals/Urinary Tract: Adrenal glands are within normal limits. Kidneys demonstrate a normal enhancement pattern bilaterally. A few small scattered cysts are seen. No follow-up is recommended. No calculi or obstructive changes are noted. The bladder is partially distended again with a small amount of air within. Anterior to superior aspect of the bladder in the midline is a small fluid collection which previously showed air within  likely representing a small abscess. Stomach/Bowel: Rectal contrast has been administered. This outlines the colonic lumen and changes of diverticulitis which persist in the sigmoid colon. Wall thickening and pericolonic inflammatory changes are again noted. The collection of air along the left side of the sigmoid colon in the pelvis seen on the prior exam is less well visualized now with a more solid area of inflammatory change without air within. No extension of contrast material into this area is identified. Additionally no extravasation of contrast is seen to delineate a colovesical fistula. More proximal colon shows no significant inflammatory changes. No obstructive changes are noted. The appendix is within normal limits. Small bowel and stomach are unremarkable. Vascular/Lymphatic: Aortic atherosclerosis. No enlarged abdominal or pelvic lymph nodes. Reproductive: Uterus has been surgically removed. Air is again noted in the vaginal vault although no findings to suggest colo-vaginal fistula are seen. Other: No abdominal wall hernia or abnormality. No abdominopelvic ascites. Musculoskeletal: No acute or significant osseous findings. IMPRESSION: Persistent changes of diverticulitis although the degree of extraluminal air has improved when compared with the prior exam. No areas of extravasation of contrast from the rectosigmoid are seen to delineate a colovesical or colovaginal fistula. Persistent areas of inflammatory change are noted along the left wall of the sigmoid and along the superior aspect of the urinary bladder which again may represent small abscesses although not drainable at this time. Again no filling of contrast in these areas is noted to suggest fistulization. Electronically Signed   By: Alcide Clever M.D.   On: 07/19/2023 21:01   CT ABDOMEN PELVIS W CONTRAST Result Date: 07/18/2023 CLINICAL DATA:  Follow-up colitis EXAM: CT ABDOMEN AND PELVIS WITH CONTRAST TECHNIQUE: Multidetector CT imaging  of the abdomen and pelvis was performed using the standard protocol following bolus administration of intravenous contrast. RADIATION DOSE REDUCTION: This exam was performed according to the departmental dose-optimization program which includes automated exposure control, adjustment of the mA and/or kV according to patient size and/or use of iterative reconstruction technique. CONTRAST:  OMNIPAQUE IOHEXOL 300 MG/ML  SOLN COMPARISON:  CT 05/22/2023 FINDINGS: Lower chest: Lung bases demonstrate no acute airspace disease. Stable punctate 2 mm subpleural right lower lobe pulmonary nodule for which no imaging follow-up is recommended Hepatobiliary: No calcified gallstone or biliary dilatation. Hepatic cysts. Pancreas: Unremarkable. No pancreatic ductal dilatation or surrounding inflammatory changes. Spleen: Normal in size without focal abnormality. Adrenals/Urinary Tract: Adrenal glands are normal. Kidneys show no hydronephrosis. The bladder shows irregular wall thickening along the roof of the bladder. New gas collection within the bladder. Irregular gas and fluid collection superior to the anterior aspect of the bladder, this measures 2.5 x 2.3 cm on series 2, image 76. May be contiguous with inflamed sigmoid colon on coronal series 5, image 31 by a thin linear density potentially a fistula. Stomach/Bowel: The stomach is nonenlarged. There is no  dilated small bowel. Diverticular disease the left colon. Redemonstrated long segment of irregular wall thickening involving sigmoid colon with considerable inflammatory changes. New small gas and fluid collection left pelvis measuring 3.2 x 0.4 cm on series 2, image 72, may be contiguous with small gas tracking towards the junction of the descending colon and sigmoid colon on series 2, image 70 and also communicating with sigmoid colon on series 2, image 72. Soft tissue density in the region measuring 3 x 1.5 cm on series 2, image 70 could reflect an enlarged node or  phlegmon. Vascular/Lymphatic: Nonaneurysmal aorta. Reproductive: Hysterectomy. New small volume gas in the vagina, series 2, image 80. Indistinct fat plane between the vaginal cuff and the inflamed sigmoid colon cranial to it on sagittal series 6, image 63. Other: No free gas is visualized. Musculoskeletal: No acute or suspicious osseous abnormality IMPRESSION: 1. Redemonstrated long segment of irregular wall thickening involving sigmoid colon with considerable inflammatory changes. Differential considerations include colitis of inflammatory etiology, long segment diverticulitis, and neoplasm. 2. New small gas and fluid collection left pelvis measuring 3.2 x 0.4 cm, may be contiguous with small gas tracking towards the junction of the descending colon and sigmoid colon and also communicating with the distal sigmoid colon raising possibility of colo colonic fistula and small abscess. Small contained perforation could also be considered. 3. New small volume gas in the vagina. Indistinct fat plane between the vaginal cuff and the inflamed sigmoid colon cranial to it. Cannot exclude colovaginal fistula. 4. New gas collection within the bladder with wall thickening along the roof of the bladder. Focal gas and fluid collection superior to the anterior roof of the bladder, also potentially communicating with adjacent inflamed sigmoid colon, collective findings are suspect for colovesical fistula and potential small abscess. 5. 3 cm left pelvic enlarged lymph node versus phlegmonous tissue. These results will be called to the ordering clinician or representative by the Radiologist Assistant, and communication documented in the PACS or [employer]. Electronically Signed   By: Jasmine Pang M.D.   On: 07/18/2023 23:34    Microbiology: Results for orders placed or performed during the hospital encounter of 07/19/23  Urine Culture     Status: Abnormal   Collection Time: 07/19/23 10:24 PM   Specimen: Urine, Clean  Catch  Result Value Ref Range Status   Specimen Description   Final    URINE, CLEAN CATCH Performed at [facility], [address], [place], [place] [address]    Special Requests   Final    NONE Performed at [facility], [address] [place], [place] [address]    Culture (A)  Final    <10,000 COLONIES/mL INSIGNIFICANT GROWTH Performed at [facility], [address], [place], [place] [address]    Report Status 07/21/2023 FINAL  Final    Labs: CBC: Recent Labs  Lab 07/19/23 1737 07/20/23 0516 07/21/23 0437  WBC 14.3* 14.5* 13.9*  NEUTROABS 9.2*  --   --   HGB 10.0* 10.2* 10.7*  HCT 32.1* 32.4* 34.2*  MCV 90.2 90.3 89.3  PLT 616* 570* 567*   Basic Metabolic Panel: Recent Labs  Lab 07/19/23 1737 07/20/23 0516 07/21/23 0437  NA 135 140 138  K 3.7 3.7 4.2  CL 101 103 103  CO2 25 27 26   GLUCOSE 119* 100* 103*  BUN 30* 21 17  CREATININE 0.69 0.66 0.46  CALCIUM 9.7 9.9 10.3  MG  --  2.2  --  Liver Function Tests: Recent Labs  Lab 07/19/23 1737  AST 12*  ALT 13  ALKPHOS 79  BILITOT 0.3  PROT 7.8  ALBUMIN 3.6   CBG: No results for input(s): "GLUCAP" in the last 168 hours.  Discharge time spent: approximately 45 minutes spent on discharge counseling, evaluation of patient on day of discharge, and coordination of discharge planning with nursing, social work, pharmacy and case management  Signed: Alberteen Sam, MD [facility] 07/21/2023

## 2023-07-22 ENCOUNTER — Telehealth: Payer: Self-pay

## 2023-07-22 NOTE — Transitions of Care (Post Inpatient/ED Visit) (Signed)
   07/22/2023  Name: Sheila Lin MRN: [id number] DOB: [birth year]  Today's TOC FU Call Status: Today's TOC FU Call Status:: Successful TOC FU Call Completed TOC FU Call Complete Date: 07/21/23 Patient's Name and Date of Birth confirmed.  Transition Care Management Follow-up Telephone Call Date of Discharge: 07/21/23 Discharge Facility: [facility]) Type of Discharge: Inpatient Admission How have you been since you were released from the hospital?: Better Any questions or concerns?: No  Items Reviewed: Did you receive and understand the discharge instructions provided?: Yes Medications obtained,verified, and reconciled?: Yes (Medications Reviewed) Any new allergies since your discharge?: No Dietary orders reviewed?: Yes Do you have support at home?: Yes People in Home: spouse  Medications Reviewed Today: Medications Reviewed Today     Reviewed by Karena Addison, LPN (Licensed Practical Nurse) on 07/22/23 at 1146  Med List Status: <None>   Medication Order Taking? Sig Documenting Provider Last Dose Status Informant  aspirin EC 81 MG tablet [id number] No Take 1 tablet (81 mg total) by mouth daily. [name], [name], MD 07/19/2023 Morning Active   atenolol (TENORMIN) 50 MG tablet [id number] No Take 1 tablet (50 mg total) by mouth daily. [name], [name], MD 07/19/2023 Morning Active   Cholecalciferol (VITAMIN D3) 2000 units capsule [id number] No Take 1 capsule (2,000 Units total) by mouth daily. [name], [name], MD 07/19/2023 Morning Active Self  ciprofloxacin (CIPRO) 500 MG tablet [id number] No Take 1 tablet (500 mg total) by mouth 2 (two) times daily for 14 days. Napoleon Form, MD 07/19/2023 Noon Active   metroNIDAZOLE (FLAGYL) 250 MG tablet [id number] No Take 2 tablets (500 mg total) by mouth 2 (two) times daily for 14 days. Napoleon Form, MD 07/19/2023 Noon Active   MULTIPLE VITAMIN PO [id number] No Take 1 tablet by mouth daily. [provider]  07/19/2023 Morning Active   polyethylene glycol (MIRALAX / GLYCOLAX) 17 g packet [id number]  Take 17 g by mouth 2 (two) times daily. Alberteen Sam, MD  Active             Home Care and Equipment/Supplies: Were Home Health Services Ordered?: NA Any new equipment or medical supplies ordered?: NA  Functional Questionnaire: Do you need assistance with bathing/showering or dressing?: No Do you need assistance with meal preparation?: No Do you need assistance with eating?: No Do you have difficulty maintaining continence: No Do you need assistance with getting out of bed/getting out of a chair/moving?: No Do you have difficulty managing or taking your medications?: No  Follow up appointments reviewed: PCP Follow-up appointment confirmed?: Yes Date of PCP follow-up appointment?: 07/29/23 Follow-up Provider: [facility] Follow-up appointment confirmed?: Yes Date of Specialist follow-up appointment?: 08/30/23 Follow-Up Specialty Provider:: GI Do you need transportation to your follow-up appointment?: No Do you understand care options if your condition(s) worsen?: Yes-patient verbalized understanding    SIGNATURE Karena Addison, LPN [facility] Nurse Health Advisor Direct Dial [phone]

## 2023-07-22 NOTE — Telephone Encounter (Signed)
I spoke with the pt and she has been advised of the referral to [facility].  She states she is feeling better with minimal abd discomfort.  She it taking the recommended BID dose of Miralax.  She will let us know if she has any changes in symptoms.

## 2023-07-22 NOTE — Telephone Encounter (Signed)
[facility] referral has been made with records faxed.  Left message on machine to call back

## 2023-07-22 NOTE — Telephone Encounter (Signed)
-----   Message from Shellia Cleverly sent at 07/21/2023  9:34 AM EST ----- I anticipate this patient will be discharged home later today or tomorrow morning.  Can you please call to check in on her mid week and see how she is doing.  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  She is currently scheduled for repeat CT on 08/01/2023.  If planning on surgical resection, can likely forego that repeat CT.  Additionally, scheduled for follow-up with GM on 3/7.  If active symptoms, will need sooner appointment with one of the APP's, otherwise I think that timeline is perfectly fine since she will be seeing the surgeons in the interim.  Discharging with Cipro/Flagyl, MiraLAX bid to keep stools soft through the narrowed, diseased sigmoid.  Thanks.

## 2023-07-24 NOTE — Telephone Encounter (Signed)
Inbound call from patient stating she has been experiencing a lot of abdominal pain and has been getting worse. Patient states she has stopped taking miralax. Patient is requesting to also discuss if a medication can be prescribed. Requesting a call back. Please advise, thank you.

## 2023-07-24 NOTE — Telephone Encounter (Signed)
The pt has been advised of the recommendation and agrees to go to the ED this afternoon. She has her grandchild at the moment.

## 2023-07-24 NOTE — Telephone Encounter (Signed)
Released from the hospital on Sunday due to diverticulitis and fluid pockets seen on CT. She was given a dose of miralax to take Sunday evening. She did have a good BM and states that now she has lower left side abd pain that has worsened.  It does get severe at times that doubles her over.  The pain is constant at a 5-6/10 but does increase to a 9-10 at its worst.  No vomiting but reports some nausea.  She is currently on Cipro 500 mg BID and flagyl 500 BID both for 14 days since being discharged.  She started it on Sunday after being released.     Dr Leonides Schanz can you please advise as DOD for Dr Meridee Score?  Thank you

## 2023-07-29 ENCOUNTER — Inpatient Hospital Stay: Payer: Commercial Managed Care - HMO | Admitting: Internal Medicine

## 2023-07-29 ENCOUNTER — Telehealth: Payer: Self-pay

## 2023-07-29 NOTE — Transitions of Care (Post Inpatient/ED Visit) (Unsigned)
   07/29/2023  Name: Sheila Lin MRN: [id number] DOB: Feb 26, 1961  Today's TOC FU Call Status: Today's TOC FU Call Status:: Unsuccessful Call (1st Attempt) Unsuccessful Call (1st Attempt) Date: 07/29/23  Attempted to reach the patient regarding the most recent Inpatient/ED visit.  Follow Up Plan: Additional outreach attempts will be made to reach the patient to complete the Transitions of Care (Post Inpatient/ED visit) call.   Signature Karena Addison, LPN [facility] Nurse Health Advisor Direct Dial [phone]

## 2023-07-30 NOTE — Transitions of Care (Post Inpatient/ED Visit) (Signed)
   07/30/2023  Name: Sheila Lin MRN: [id number] DOB: 04-Dec-1960  Today's TOC FU Call Status: Today's TOC FU Call Status:: Successful TOC FU Call Completed Unsuccessful Call (1st Attempt) Date: 07/29/23 [facility] FU Call Complete Date: 07/30/23  Attempted to reach the patient regarding the most recent Inpatient/ED visit.  Follow Up Plan: No further outreach attempts will be made at this time. We have been unable to contact the patient.  Signature Julian Lemmings, LPN [facility] Nurse Health Advisor Direct Dial [phone]

## 2023-07-31 ENCOUNTER — Telehealth: Payer: Self-pay | Admitting: Internal Medicine

## 2023-07-31 NOTE — Telephone Encounter (Signed)
 Copied from CRM [id number]. Topic: General - Other >> Jul 31, 2023 10:18 AM Isabell A wrote: Reason for CRM: Rollene from [place] [place], nurse case manager just wanted to give PCP her phone number in case they're are any case management needs.  Phone number: [phone]

## 2023-08-01 ENCOUNTER — Telehealth: Payer: Commercial Managed Care - HMO

## 2023-08-01 ENCOUNTER — Ambulatory Visit (HOSPITAL_COMMUNITY): Payer: Commercial Managed Care - HMO

## 2023-08-01 NOTE — Telephone Encounter (Signed)
-----   Message from April M sent at 08/01/2023  7:32 AM EST ----- Regarding: CT SCAN Good Morning, We received a fax yesterday stating they denied her CT scan again after sending over the hospital notes. Per [employer] rep, the only way to obtain the authorization now, is a peer-to-peer with someone clinical. The number to call is [phone] and the Order case# is [id number].  Please let me know if I need to do anything further.  Thank you.

## 2023-08-01 NOTE — Telephone Encounter (Signed)
 Spoke with [employer] and was told that approval was given after additional records faxed.  Auth number [id number] from 07-23-23-01-19-24.

## 2023-08-02 ENCOUNTER — Encounter: Payer: Self-pay | Admitting: Family Medicine

## 2023-08-02 ENCOUNTER — Ambulatory Visit (INDEPENDENT_AMBULATORY_CARE_PROVIDER_SITE_OTHER): Payer: Commercial Managed Care - HMO | Admitting: Family Medicine

## 2023-08-02 VITALS — BP 136/90 | HR 77 | Temp 98.8°F | Ht 68.0 in | Wt 156.8 lb

## 2023-08-02 DIAGNOSIS — R300 Dysuria: Secondary | ICD-10-CM | POA: Diagnosis not present

## 2023-08-02 DIAGNOSIS — K5792 Diverticulitis of intestine, part unspecified, without perforation or abscess without bleeding: Secondary | ICD-10-CM | POA: Diagnosis not present

## 2023-08-02 LAB — CBC WITH DIFFERENTIAL/PLATELET
Basophils Absolute: 0.1 10*3/uL (ref 0.0–0.1)
Basophils Relative: 0.9 % (ref 0.0–3.0)
Eosinophils Absolute: 0.2 10*3/uL (ref 0.0–0.7)
Eosinophils Relative: 1.4 % (ref 0.0–5.0)
HCT: 34 % — ABNORMAL LOW (ref 36.0–46.0)
Hemoglobin: 11 g/dL — ABNORMAL LOW (ref 12.0–15.0)
Lymphocytes Relative: 19.1 % (ref 12.0–46.0)
Lymphs Abs: 3 10*3/uL (ref 0.7–4.0)
MCHC: 32.4 g/dL (ref 30.0–36.0)
MCV: 87.8 fL (ref 78.0–100.0)
Monocytes Absolute: 0.8 10*3/uL (ref 0.1–1.0)
Monocytes Relative: 4.9 % (ref 3.0–12.0)
Neutro Abs: 11.7 10*3/uL — ABNORMAL HIGH (ref 1.4–7.7)
Neutrophils Relative %: 73.7 % (ref 43.0–77.0)
Platelets: 682 10*3/uL — ABNORMAL HIGH (ref 150.0–400.0)
RBC: 3.87 Mil/uL (ref 3.87–5.11)
RDW: 15.3 % (ref 11.5–15.5)
WBC: 15.9 10*3/uL — ABNORMAL HIGH (ref 4.0–10.5)

## 2023-08-02 LAB — POC URINALSYSI DIPSTICK (AUTOMATED)
Bilirubin, UA: NEGATIVE
Blood, UA: NEGATIVE
Glucose, UA: NEGATIVE
Ketones, UA: NEGATIVE
Nitrite, UA: NEGATIVE
Protein, UA: NEGATIVE
Spec Grav, UA: 1.015 (ref 1.010–1.025)
Urobilinogen, UA: NEGATIVE U/dL — AB
pH, UA: 8 (ref 5.0–8.0)

## 2023-08-02 LAB — COMPREHENSIVE METABOLIC PANEL
ALT: 17 U/L (ref 0–35)
AST: 18 U/L (ref 0–37)
Albumin: 4.3 g/dL (ref 3.5–5.2)
Alkaline Phosphatase: 73 U/L (ref 39–117)
BUN: 15 mg/dL (ref 6–23)
CO2: 27 meq/L (ref 19–32)
Calcium: 11 mg/dL — ABNORMAL HIGH (ref 8.4–10.5)
Chloride: 102 meq/L (ref 96–112)
Creatinine, Ser: 0.56 mg/dL (ref 0.40–1.20)
GFR: 96.68 mL/min (ref >60.00)
Glucose, Bld: 90 mg/dL (ref 70–99)
Potassium: 4.2 meq/L (ref 3.5–5.1)
Sodium: 141 meq/L (ref 135–145)
Total Bilirubin: 0.3 mg/dL (ref 0.2–1.2)
Total Protein: 8 g/dL (ref 6.0–8.3)

## 2023-08-02 NOTE — Patient Instructions (Addendum)
 We are checking labs today, will be in contact with any results that require further attention.  Follow up with General Surgery as scheduled.  Follow-up with me for new or worsening symptoms.

## 2023-08-02 NOTE — Progress Notes (Signed)
 Established Patient Office Visit  Subjective   Patient ID: Sheila Lin, female    DOB: 1960/12/02  Age: 63 y.o. MRN: [id number]  Chief Complaint  Patient presents with   Hospitalization Follow-up    Hospital visits 01/24 [place] [place] 01/30-02/01 for diverticulitis and UTI. Patient still experiencing intermittent pain. Patient prescribed ciprofloxacin , iron  supplement, and metronidazole     HPI Follow up Hospitalization  Patient was admitted to [facility] on 07/19/23 and discharged on 07/21/23.  Hospital notes reviewed by me. She was treated for abdominal pain, diverticulitis. Treatment for this included IV Zosyn  and was sent home with 14 days of oral Cipro  and Flagyl . Telephone follow up was done on 07/22/23 She reports good compliance with treatment. She reports this condition is improving. She has a consult with [place] surgery on Monday 08/05/23. Reports that she has 7 more days of Cipro  and Flagyl . Reports that she is having left lower quadrant abdominal pain on and off. Also states that she was seen at [facility] on January 30 through July 27, 2023.  States that they drained an intestinal abscess and she was told that she had a yeast infection in her gut.  Cannot recall treatment.  We do not have records. Reports that she is having some dysuria and urinary frequency.  Denies gross hematuria, foul odor with urine, chills, fever, other symptoms.   ----------------------------------------------------------------------------------------- -   ROS Per HPI    Objective:     BP (!) 136/90   Pulse 77   Temp 98.8 F (37.1 C)   Ht 5' 8 (1.727 m)   Wt 156 lb 12.8 oz (71.1 kg)   SpO2 95%   BMI 23.84 kg/m   Physical Exam Vitals and nursing note reviewed.  Constitutional:      General: She is not in acute distress.    Appearance: Normal appearance. She is normal weight.  HENT:     Head: Normocephalic and atraumatic.     Nose: Nose  normal.  Eyes:     Extraocular Movements: Extraocular movements intact.     Pupils: Pupils are equal, round, and reactive to light.  Cardiovascular:     Rate and Rhythm: Normal rate and regular rhythm.     Heart sounds: Normal heart sounds.  Pulmonary:     Effort: Pulmonary effort is normal. No respiratory distress.     Breath sounds: Normal breath sounds. No wheezing, rhonchi or rales.  Abdominal:     General: Bowel sounds are normal. There is no distension.     Palpations: Abdomen is soft. There is no mass.     Tenderness: There is abdominal tenderness (LLQ).     Hernia: No hernia is present.  Musculoskeletal:        General: Normal range of motion.     Cervical back: Normal range of motion.  Skin:    General: Skin is warm and dry.  Neurological:     General: No focal deficit present.     Mental Status: She is alert and oriented to person, place, and time.  Psychiatric:        Mood and Affect: Mood normal.        Thought Content: Thought content normal.     Results for orders placed or performed in visit on 08/02/23  POCT Urinalysis Dipstick (Automated)  Result Value Ref Range   Color, UA     Clarity, UA     Glucose, UA Negative Negative   Bilirubin, UA negative  Ketones, UA negative    Spec Grav, UA 1.015 1.010 - 1.025   Blood, UA negative    pH, UA 8.0 5.0 - 8.0   Protein, UA Negative Negative   Urobilinogen, UA negative (A) 0.2 or 1.0 E.U./dL   Nitrite, UA negative    Leukocytes, UA Trace (A) Negative     The 10-year ASCVD risk score (Arnett DK, et al., 2019) is: 5.1%    Assessment & Plan:   Dysuria -     POCT Urinalysis Dipstick (Automated) -     Urine Culture; Future  Diverticulitis -     CBC with Differential/Platelet -     Comprehensive metabolic panel   -Follow up with general surgery as scheduled  Return if symptoms worsen or fail to improve.    Corean Ku, FNP

## 2023-08-03 LAB — URINE CULTURE: Result:: NO GROWTH

## 2023-08-04 ENCOUNTER — Encounter: Payer: Self-pay | Admitting: Family Medicine

## 2023-08-04 NOTE — Telephone Encounter (Signed)
 Noted. Thanks.

## 2023-08-05 ENCOUNTER — Ambulatory Visit: Payer: Self-pay | Admitting: Surgery

## 2023-08-05 DIAGNOSIS — R739 Hyperglycemia, unspecified: Secondary | ICD-10-CM

## 2023-08-07 ENCOUNTER — Encounter (HOSPITAL_COMMUNITY): Payer: Self-pay

## 2023-08-07 ENCOUNTER — Ambulatory Visit (HOSPITAL_COMMUNITY): Payer: Commercial Managed Care - HMO

## 2023-08-12 ENCOUNTER — Other Ambulatory Visit: Payer: Self-pay | Admitting: Urology

## 2023-08-20 NOTE — Patient Instructions (Addendum)
 SURGICAL WAITING ROOM VISITATION Patients having surgery or a procedure may have no more than 2 support people in the waiting area - these visitors may rotate in the visitor waiting room.   Due to an increase in RSV and influenza rates and associated hospitalizations, children ages 36 and under may not visit patients in [facility] hospitals. If the patient needs to stay at the hospital during part of their recovery, the visitor guidelines for inpatient rooms apply.  PRE-OP VISITATION  Pre-op nurse will coordinate an appropriate time for 1 support person to accompany the patient in pre-op.  This support person may not rotate.  This visitor will be contacted when the time is appropriate for the visitor to come back in the pre-op area.  Please refer to the [facility] website for the visitor guidelines for Inpatients (after your surgery is over and you are in a regular room).  You are not required to quarantine at this time prior to your surgery. However, you must do this: Hand Hygiene often Do NOT share personal items Notify your provider if you are in close contact with someone who has COVID or you develop fever 100.4 or greater, new onset of sneezing, cough, sore throat, shortness of breath or body aches.  If you test positive for Covid or have been in contact with anyone that has tested positive in the last 10 days please notify you surgeon.    Your procedure is scheduled on:  FRIDAY  August 30, 2023  Report to [facility] Main Entrance: [facility] entrance where the [employer] is available.   Report to admitting at: 08:45    AM  Call this number if you have any questions or problems the morning of surgery [phone]  FOLLOW ANY ADDITIONAL PRE OP INSTRUCTIONS YOU RECEIVED FROM YOUR SURGEON'S OFFICE!!!  COLON BOWEL PREP  FIVE DAYS PRIOR TO YOUR SURGERY  Obtain supplies for the bowel prep at a pharmacy of your choice: Office-e-mailed prescriptions for your antibiotic pills  (Neomycin & Metronidazole)  A bottle of MiraLax / Glycolax (238g) - no prescription required  A large bottle of Gatorade / Powerade (64oz) - - no prescription required  Dulcolax tablets (4 tablets) - no prescription required   Improve nutrition: Consider drinking nutritional shakes (Ex: Boost or Ensure) 5 days prior to surgery  DAY PRIOR TO SURGERY   Switch to a liquid diet the day before surgery Drink plenty of liquids all day to avoid getting dehydrated  If you are having nausea or vomiting, hold the neomycin antibiotic & just take metronidazole tablets.  7:00am Swallow 4 Dulcolax tablets with some water  10:00am Mix the bottle of MiraLax with the 64-oz bottle of Gatorade.  Drink the Gatorade/Miralax mixture gradually (8oz glass every 15 minutes) until gone. (You should finish in 4 hours)  2:00pm Take 2 Neomycin 500mg  tablets & 2 Metronidazole 500mg  tablets  3:00pm Take 2 Neomycin 500mg  tablets & 2 Metronidazole 500mg  tablets  Drink plenty of clear liquids all evening to avoid getting dehydrated  10:00pm Take 2 Neomycin 500mg  tablets & 2 Metronidazole 500mg  tablets  Drink 2 Carbohydrate loading nutrition drinks (ex: Ensure Presurgery)  If you have questions or concerns, please call [facility] [phone] during business hours to speak to the clinical staff for advice.   DRINK two (2) bottles of Pre-Surgery G2 drink starting at 6:00 pm the evening prior to your surgery to help prevent dehydration. Increase drinking clear fluids (see list below)  Do not eat food after Midnight the night prior to your surgery/procedure.  After Midnight you may have the following liquids until  08:00 AM DAY OF SURGERY  Clear Liquid Diet Water Black Coffee (sugar ok, NO MILK/CREAM OR CREAMERS)  Tea (sugar ok, NO MILK/CREAM OR CREAMERS) regular and decaf                             Plain Jell-O  with no fruit (NO RED)                                           Fruit  ices (not with fruit pulp, NO RED)                                     Popsicles (NO RED)                                                                  Juice: NO CITRUS JUICES: only apple, WHITE grape, WHITE cranberry Sports drinks like Gatorade or Powerade (NO RED)                   The day of surgery:  Drink ONE (1) Pre-Surgery G2 at   08:00 AM the morning of surgery. Drink in one sitting. Do not sip.  This drink was given to you during your hospital pre-op appointment visit. Nothing else to drink after completing the Pre-Surgery  G2 : No candy, chewing gum or throat lozenges.     Oral Hygiene is also important to reduce your risk of infection.        Remember - BRUSH YOUR TEETH THE MORNING OF SURGERY WITH YOUR REGULAR TOOTHPASTE  Do NOT smoke after Midnight the night before surgery.  STOP TAKING all Vitamins, Herbs and supplements 1 week before your surgery.   Take ONLY these medicines the morning of surgery with A SIP OF WATER: atenolol  You may not have any metal on your body including hair pins, jewelry, and body piercing  Do not wear make-up, lotions, powders, perfumes or deodorant  Do not wear nail polish including gel and S&S, artificial / acrylic nails, or any other type of covering on natural nails including finger and toenails. If you have artificial nails, gel coating, etc., that needs to be removed by a nail salon, Please have this removed prior to surgery. Not doing so may mean that your surgery could be cancelled or delayed if the Surgeon or anesthesia staff feels like they are unable to monitor you safely.   Do not shave 48 hours prior to surgery to avoid nicks in your skin which may contribute to postoperative infections.   Contacts, Hearing Aids, dentures or bridgework may not be worn into surgery. DENTURES WILL BE REMOVED PRIOR TO SURGERY PLEASE DO NOT APPLY "Poly grip" OR ADHESIVES!!!  You may bring a small overnight bag with you on the day of surgery, only  pack items that are not valuable. [facility] IS NOT RESPONSIBLE   FOR VALUABLES THAT  ARE LOST OR STOLEN.   Do not bring your home medications to the hospital. The Pharmacy will dispense medications listed on your medication list to you during your admission in the Hospital.  Please read over the following fact sheets you were given: IF YOU HAVE QUESTIONS ABOUT YOUR PRE-OP INSTRUCTIONS, PLEASE CALL [phone]   [facility] - Preparing for Surgery Before surgery, you can play an important role.  Because skin is not sterile, your skin needs to be as free of germs as possible.  You can reduce the number of germs on your skin by washing with CHG (chlorahexidine gluconate) soap before surgery.  CHG is an antiseptic cleaner which kills germs and bonds with the skin to continue killing germs even after washing. Please DO NOT use if you have an allergy to CHG or antibacterial soaps.  If your skin becomes reddened/irritated stop using the CHG and inform your nurse when you arrive at Short Stay. Do not shave (including legs and underarms) for at least 48 hours prior to the first CHG shower.  You may shave your face/neck.  Please follow these instructions carefully:  1.  Shower with CHG Soap the night before surgery and the  morning of surgery.  2.  If you choose to wash your hair, wash your hair first as usual with your normal  shampoo.  3.  After you shampoo, rinse your hair and body thoroughly to remove the shampoo.                             4.  Use CHG as you would any other liquid soap.  You can apply chg directly to the skin and wash.  Gently with a scrungie or clean washcloth.  5.  Apply the CHG Soap to your body ONLY FROM THE NECK DOWN.   Do not use on face/ open                           Wound or open sores. Avoid contact with eyes, ears mouth and genitals (private parts).                       Wash face,  Genitals (private parts) with your normal soap.             6.  Wash thoroughly, paying  special attention to the area where your  surgery  will be performed.  7.  Thoroughly rinse your body with warm water from the neck down.  8.  DO NOT shower/wash with your normal soap after using and rinsing off the CHG Soap.            9.  Pat yourself dry with a clean towel.            10.  Wear clean pajamas.            11.  Place clean sheets on your bed the night of your first shower and do not  sleep with pets.  ON THE DAY OF SURGERY : Do not apply any lotions/deodorants the morning of surgery.  Please wear clean clothes to the hospital/surgery center.     FAILURE TO FOLLOW THESE INSTRUCTIONS MAY RESULT IN THE CANCELLATION OF YOUR SURGERY  PATIENT SIGNATURE_________________________________  NURSE SIGNATURE__________________________________  ________________________________________________________________________       Rogelia Mire    An incentive spirometer is a tool that can  help keep your lungs clear and active. This tool measures how well you are filling your lungs with each breath. Taking long deep breaths may help reverse or decrease the chance of developing breathing (pulmonary) problems (especially infection) following: A long period of time when you are unable to move or be active. BEFORE THE PROCEDURE  If the spirometer includes an indicator to show your best effort, your nurse or respiratory therapist will set it to a desired goal. If possible, sit up straight or lean slightly forward. Try not to slouch. Hold the incentive spirometer in an upright position. INSTRUCTIONS FOR USE  Sit on the edge of your bed if possible, or sit up as far as you can in bed or on a chair. Hold the incentive spirometer in an upright position. Breathe out normally. Place the mouthpiece in your mouth and seal your lips tightly around it. Breathe in slowly and as deeply as possible, raising the piston or the ball toward the top of the column. Hold your breath for 3-5 seconds  or for as long as possible. Allow the piston or ball to fall to the bottom of the column. Remove the mouthpiece from your mouth and breathe out normally. Rest for a few seconds and repeat Steps 1 through 7 at least 10 times every 1-2 hours when you are awake. Take your time and take a few normal breaths between deep breaths. The spirometer may include an indicator to show your best effort. Use the indicator as a goal to work toward during each repetition. After each set of 10 deep breaths, practice coughing to be sure your lungs are clear. If you have an incision (the cut made at the time of surgery), support your incision when coughing by placing a pillow or rolled up towels firmly against it. Once you are able to get out of bed, walk around indoors and cough well. You may stop using the incentive spirometer when instructed by your caregiver.  RISKS AND COMPLICATIONS Take your time so you do not get dizzy or light-headed. If you are in pain, you may need to take or ask for pain medication before doing incentive spirometry. It is harder to take a deep breath if you are having pain. AFTER USE Rest and breathe slowly and easily. It can be helpful to keep track of a log of your progress. Your caregiver can provide you with a simple table to help with this. If you are using the spirometer at home, follow these instructions: SEEK MEDICAL CARE IF:  You are having difficultly using the spirometer. You have trouble using the spirometer as often as instructed. Your pain medication is not giving enough relief while using the spirometer. You develop fever of 100.5 F (38.1 C) or higher.                                                                                                    SEEK IMMEDIATE MEDICAL CARE IF:  You cough up bloody sputum that had not been present before. You develop fever of 102 F (38.9 C) or greater. You  develop worsening pain at or near the incision site. MAKE SURE YOU:   Understand these instructions. Will watch your condition. Will get help right away if you are not doing well or get worse. Document Released: 10/22/2006 Document Revised: 09/03/2011 Document Reviewed: 12/23/2006 [facility] Patient Information 2014 [place], [place].        WHAT IS A BLOOD TRANSFUSION? Blood Transfusion Information  A transfusion is the replacement of blood or some of its parts. Blood is made up of multiple cells which provide different functions. Red blood cells carry oxygen and are used for blood loss replacement. White blood cells fight against infection. Platelets control bleeding. Plasma helps clot blood. Other blood products are available for specialized needs, such as hemophilia or other clotting disorders. BEFORE THE TRANSFUSION  Who gives blood for transfusions?  Healthy volunteers who are fully evaluated to make sure their blood is safe. This is blood bank blood. Transfusion therapy is the safest it has ever been in the practice of medicine. Before blood is taken from a donor, a complete history is taken to make sure that person has no history of diseases nor engages in risky social behavior (examples are intravenous drug use or sexual activity with multiple partners). The donor's travel history is screened to minimize risk of transmitting infections, such as malaria. The donated blood is tested for signs of infectious diseases, such as HIV and hepatitis. The blood is then tested to be sure it is compatible with you in order to minimize the chance of a transfusion reaction. If you or a relative donates blood, this is often done in anticipation of surgery and is not appropriate for emergency situations. It takes many days to process the donated blood. RISKS AND COMPLICATIONS Although transfusion therapy is very safe and saves many lives, the main dangers of transfusion include:  Getting an infectious disease. Developing a transfusion reaction. This is an allergic  reaction to something in the blood you were given. Every precaution is taken to prevent this. The decision to have a blood transfusion has been considered carefully by your caregiver before blood is given. Blood is not given unless the benefits outweigh the risks. AFTER THE TRANSFUSION Right after receiving a blood transfusion, you will usually feel much better and more energetic. This is especially true if your red blood cells have gotten low (anemic). The transfusion raises the level of the red blood cells which carry oxygen, and this usually causes an energy increase. The nurse administering the transfusion will monitor you carefully for complications. HOME CARE INSTRUCTIONS  No special instructions are needed after a transfusion. You may find your energy is better. Speak with your caregiver about any limitations on activity for underlying diseases you may have. SEEK MEDICAL CARE IF:  Your condition is not improving after your transfusion. You develop redness or irritation at the intravenous (IV) site. SEEK IMMEDIATE MEDICAL CARE IF:  Any of the following symptoms occur over the next 12 hours: Shaking chills. You have a temperature by mouth above 102 F (38.9 C), not controlled by medicine. Chest, back, or muscle pain. People around you feel you are not acting correctly or are confused. Shortness of breath or difficulty breathing. Dizziness and fainting. You get a rash or develop hives. You have a decrease in urine output. Your urine turns a dark color or changes to pink, red, or brown. Any of the following symptoms occur over the next 10 days: You have a temperature by mouth above 102 F (38.9 C), not  controlled by medicine. Shortness of breath. Weakness after normal activity. The white part of the eye turns yellow (jaundice). You have a decrease in the amount of urine or are urinating less often. Your urine turns a dark color or changes to pink, red, or brown. Document Released:  06/08/2000 Document Revised: 09/03/2011 Document Reviewed: 01/26/2008 [facility] Patient Information 2014 [place], [place].  _______________________________________________________________________

## 2023-08-20 NOTE — Progress Notes (Signed)
 COVID Vaccine received:  []  No [x]  Yes Date of any COVID positive Test in last 90 days:  PCP - Jacinta Shoe, MD [phone] (Work)  [phone] (Fax)  Cardiologist -  none now (saw [facility] in 2011 for palpitations)  Chest x-ray -06-06-2018  2v  Epic  EKG -  06-06-2018    Repeat at [facility] Stress Test - 10-16-1999  Epic ECHO -  Cardiac Cath -   PCR screen: []  Ordered & Completed [x]   No Order but Needs PROFEND     []   N/A for this surgery  Surgery Plan:  []  Ambulatory   []  Outpatient in bed  [x]  Admit Anesthesia:    [x]  General  []  Spinal  []   Choice []   MAC  Bowel Prep - []  No  [x]   Yes ______  Pacemaker / ICD device [x]  No []  Yes   Spinal Cord Stimulator:[x]  No []  Yes       History of Sleep Apnea? [x]  No []  Yes   CPAP used?- [x]  No []  Yes    Does the patient monitor blood sugar?   []  N/A   []  No []  Yes  Patient has: []  NO Hx DM   [x]  Pre-DM   []  DM1  []   DM2 Last A1c was:5.8 on  01-17-2023      Blood Thinner / Instructions: none Aspirin Instructions:ASA 81 mg  ERAS Protocol Ordered: []  No  [x]  Yes PRE-SURGERY []  ENSURE  [x]  G2 X3   Patient is to be NPO after: 0800  Dental hx: []  Dentures:  []  N/A      []  Bridge or Partial:                   []  Loose or Damaged teeth:   Comments:  Ostomy nurse consult for marking abdomen    Activity level: Patient is able / unable to climb a flight of stairs without difficulty; []  No CP  []  No SOB, but would have ___   Patient can / can not perform ADLs without assistance.   Anesthesia review: anemia, HTN, PALPs (2011- saw [facility] per H&P), thrombocytosis  Patient denies shortness of breath, fever, cough and chest pain at PAT appointment.  Patient verbalized understanding and agreement to the Pre-Surgical Instructions that were given to them at this PAT appointment. Patient was also educated of the need to review these PAT instructions again prior to her surgery.I reviewed the appropriate phone numbers to call if  they have any and questions or concerns.

## 2023-08-21 ENCOUNTER — Encounter (HOSPITAL_COMMUNITY): Payer: Self-pay

## 2023-08-21 ENCOUNTER — Other Ambulatory Visit: Payer: Self-pay

## 2023-08-21 ENCOUNTER — Encounter (HOSPITAL_COMMUNITY)
Admission: RE | Admit: 2023-08-21 | Discharge: 2023-08-21 | Disposition: A | Discharge status: Home / Self Care | Payer: Commercial Managed Care - HMO | Source: Ambulatory Visit | Attending: Surgery | Admitting: Surgery

## 2023-08-21 VITALS — BP 100/62 | HR 78 | Temp 98.4°F | Resp 16 | Ht 68.0 in | Wt 159.0 lb

## 2023-08-21 DIAGNOSIS — R739 Hyperglycemia, unspecified: Secondary | ICD-10-CM | POA: Insufficient documentation

## 2023-08-21 DIAGNOSIS — R7303 Prediabetes: Secondary | ICD-10-CM

## 2023-08-21 DIAGNOSIS — D649 Anemia, unspecified: Secondary | ICD-10-CM | POA: Diagnosis not present

## 2023-08-21 DIAGNOSIS — Z01818 Encounter for other preprocedural examination: Secondary | ICD-10-CM | POA: Diagnosis present

## 2023-08-21 DIAGNOSIS — I10 Essential (primary) hypertension: Secondary | ICD-10-CM | POA: Insufficient documentation

## 2023-08-21 HISTORY — DX: Cardiac arrhythmia, unspecified: I49.9

## 2023-08-21 HISTORY — DX: Malignant (primary) neoplasm, unspecified: C80.1

## 2023-08-21 HISTORY — DX: Gastro-esophageal reflux disease without esophagitis: K21.9

## 2023-08-21 HISTORY — DX: Disease of blood and blood-forming organs, unspecified: D75.9

## 2023-08-21 HISTORY — DX: Unspecified osteoarthritis, unspecified site: M19.90

## 2023-08-21 HISTORY — DX: Essential (primary) hypertension: I10

## 2023-08-21 LAB — CBC
HCT: 33.7 % — ABNORMAL LOW (ref 36.0–46.0)
Hemoglobin: 10.4 g/dL — ABNORMAL LOW (ref 12.0–15.0)
MCH: 28.3 pg (ref 26.0–34.0)
MCHC: 30.9 g/dL (ref 30.0–36.0)
MCV: 91.8 fL (ref 80.0–100.0)
Platelets: 444 10*3/uL — ABNORMAL HIGH (ref 150–400)
RBC: 3.67 MIL/uL — ABNORMAL LOW (ref 3.87–5.11)
RDW: 14.6 % (ref 11.5–15.5)
WBC: 11.2 10*3/uL — ABNORMAL HIGH (ref 4.0–10.5)
nRBC: 0 % (ref 0.0–0.2)

## 2023-08-21 LAB — BASIC METABOLIC PANEL
Anion gap: 8 (ref 5–15)
BUN: 29 mg/dL — ABNORMAL HIGH (ref 8–23)
CO2: 26 mmol/L (ref 22–32)
Calcium: 9.7 mg/dL (ref 8.9–10.3)
Chloride: 106 mmol/L (ref 98–111)
Creatinine, Ser: 0.67 mg/dL (ref 0.44–1.00)
GFR, Estimated: >60 mL/min (ref >60)
Glucose, Bld: 93 mg/dL (ref 70–99)
Potassium: 4.6 mmol/L (ref 3.5–5.1)
Sodium: 140 mmol/L (ref 135–145)

## 2023-08-21 NOTE — Consult Note (Addendum)
 WOC Nurse requested for preoperative stoma site marking  Discussed surgical procedure and stoma creation with patient and husband.  Explained role of the WOC nurse team.  Provided the patient with educational booklet and provided samples of pouching options.  Answered patient's questions.   Examined patient sitting, and standing in order to place the marking in the patient's visual field, away from any creases or abdominal contour issues and within the rectus muscle.  Attempted to mark below the patient's belt line, but this was not possible, since a significant crease occurs lower on the abd when the patient leans forward which should be avoided if possible, so I placed the sites higher then usual.   Marked for colostomy in the LLQ  __3__ cm to the left of the umbilicus and __8__cm above the umbilicus.  Marked for ileostomy in the RLQ  __3__cm to the right of the umbilicus and  __8__ cm above the umbilicus.  Patient's abdomen cleansed with CHG wipes at site markings, allowed to air dry prior to marking. Covered mark with thin film transparent dressing to preserve mark until date of surgery. Provided with marking pen and instructed to re-color in the locations if they begin to fame prior to surgery.  WOC Nurse team will follow up with patient after surgery for continued ostomy care and teaching if patient receives an ostomy.  Thank-you,  Cammie Mcgee MSN, RN, CWOCN, [place], CNS [phone]

## 2023-08-22 LAB — HEMOGLOBIN A1C
Hgb A1c MFr Bld: 5.4 % (ref 4.8–5.6)
Mean Plasma Glucose: 108 mg/dL

## 2023-08-30 ENCOUNTER — Ambulatory Visit: Payer: Commercial Managed Care - HMO | Admitting: Gastroenterology

## 2023-08-30 ENCOUNTER — Inpatient Hospital Stay (HOSPITAL_COMMUNITY)
Admission: RE | Admit: 2023-08-30 | Discharge: 2023-09-02 | DRG: 331 | Disposition: A | Discharge status: Home / Self Care | Payer: Commercial Managed Care - HMO | Attending: Surgery | Admitting: Surgery

## 2023-08-30 ENCOUNTER — Other Ambulatory Visit: Payer: Self-pay

## 2023-08-30 ENCOUNTER — Inpatient Hospital Stay (HOSPITAL_COMMUNITY): Admitting: Anesthesiology

## 2023-08-30 ENCOUNTER — Encounter (HOSPITAL_COMMUNITY)
Admission: RE | Disposition: A | Discharge status: Home / Self Care | Payer: Self-pay | Source: Home / Self Care | Attending: Surgery

## 2023-08-30 ENCOUNTER — Encounter (HOSPITAL_COMMUNITY): Payer: Self-pay | Admitting: Surgery

## 2023-08-30 DIAGNOSIS — Z90710 Acquired absence of both cervix and uterus: Secondary | ICD-10-CM | POA: Diagnosis not present

## 2023-08-30 DIAGNOSIS — Z7982 Long term (current) use of aspirin: Secondary | ICD-10-CM

## 2023-08-30 DIAGNOSIS — I10 Essential (primary) hypertension: Secondary | ICD-10-CM | POA: Diagnosis present

## 2023-08-30 DIAGNOSIS — K5792 Diverticulitis of intestine, part unspecified, without perforation or abscess without bleeding: Secondary | ICD-10-CM | POA: Diagnosis present

## 2023-08-30 DIAGNOSIS — N739 Female pelvic inflammatory disease, unspecified: Secondary | ICD-10-CM | POA: Diagnosis present

## 2023-08-30 DIAGNOSIS — Z888 Allergy status to other drugs, medicaments and biological substances status: Secondary | ICD-10-CM | POA: Diagnosis not present

## 2023-08-30 DIAGNOSIS — K5720 Diverticulitis of large intestine with perforation and abscess without bleeding: Principal | ICD-10-CM | POA: Diagnosis present

## 2023-08-30 DIAGNOSIS — Z808 Family history of malignant neoplasm of other organs or systems: Secondary | ICD-10-CM | POA: Diagnosis not present

## 2023-08-30 DIAGNOSIS — Z800 Family history of malignant neoplasm of digestive organs: Secondary | ICD-10-CM | POA: Diagnosis not present

## 2023-08-30 DIAGNOSIS — K5289 Other specified noninfective gastroenteritis and colitis: Secondary | ICD-10-CM | POA: Diagnosis present

## 2023-08-30 DIAGNOSIS — Z85828 Personal history of other malignant neoplasm of skin: Secondary | ICD-10-CM | POA: Diagnosis not present

## 2023-08-30 DIAGNOSIS — Z91018 Allergy to other foods: Secondary | ICD-10-CM

## 2023-08-30 DIAGNOSIS — K5732 Diverticulitis of large intestine without perforation or abscess without bleeding: Principal | ICD-10-CM | POA: Diagnosis present

## 2023-08-30 DIAGNOSIS — H547 Unspecified visual loss: Secondary | ICD-10-CM | POA: Diagnosis present

## 2023-08-30 DIAGNOSIS — Z8659 Personal history of other mental and behavioral disorders: Secondary | ICD-10-CM | POA: Diagnosis not present

## 2023-08-30 DIAGNOSIS — Z860100 Personal history of colon polyps, unspecified: Secondary | ICD-10-CM

## 2023-08-30 LAB — TYPE AND SCREEN
ABO/RH(D): A POS
Antibody Screen: NEGATIVE

## 2023-08-30 LAB — ABO/RH: ABO/RH(D): A POS

## 2023-08-30 LAB — GLUCOSE, CAPILLARY: Glucose-Capillary: 163 mg/dL — ABNORMAL HIGH (ref 70–99)

## 2023-08-30 SURGERY — COLECTOMY, SIGMOID, ROBOT-ASSISTED
Anesthesia: General

## 2023-08-30 MED ORDER — MELATONIN 3 MG PO TABS: 3.0000 mg | ORAL_TABLET | Freq: Every evening | ORAL | Status: DC | PRN

## 2023-08-30 MED ORDER — ROCURONIUM BROMIDE 10 MG/ML (PF) SYRINGE
PREFILLED_SYRINGE | INTRAVENOUS | Status: AC
  Filled 2023-08-30: qty 10

## 2023-08-30 MED ORDER — HYDROCODONE-ACETAMINOPHEN 5-325 MG PO TABS
1.0000 | ORAL_TABLET | ORAL | Status: DC | PRN
  Administered 2023-08-30: 1 via ORAL
  Administered 2023-08-31 (×2): 2 via ORAL
  Administered 2023-08-31 (×2): 1 via ORAL
  Administered 2023-09-01 – 2023-09-02 (×4): 2 via ORAL
  Filled 2023-08-30: qty 1
  Filled 2023-08-30: qty 2
  Filled 2023-08-30: qty 1
  Filled 2023-08-30 (×2): qty 2
  Filled 2023-08-30 (×2): qty 1
  Filled 2023-08-30 (×3): qty 2

## 2023-08-30 MED ORDER — METRONIDAZOLE 500 MG PO TABS
1000.0000 mg | ORAL_TABLET | ORAL | Status: DC
Start: 2023-08-30 — End: 2023-08-30

## 2023-08-30 MED ORDER — ALVIMOPAN 12 MG PO CAPS
12.0000 mg | ORAL_CAPSULE | Freq: Two times a day (BID) | ORAL | Status: DC
  Filled 2023-08-30: qty 1

## 2023-08-30 MED ORDER — ENSURE PRE-SURGERY PO LIQD
592.0000 mL | Freq: Once | ORAL | Status: DC
Start: 2023-08-30 — End: 2023-08-30

## 2023-08-30 MED ORDER — FENTANYL CITRATE (PF) 100 MCG/2ML IJ SOLN
INTRAMUSCULAR | Status: DC | PRN
  Administered 2023-08-30 (×2): 50 ug via INTRAVENOUS

## 2023-08-30 MED ORDER — SODIUM CHLORIDE (PF) 0.9 % IJ SOLN
INTRAMUSCULAR | Status: DC | PRN
  Administered 2023-08-30: 10 mL

## 2023-08-30 MED ORDER — SODIUM CHLORIDE 0.9 % IV SOLN: 250.0000 mL | INTRAVENOUS | Status: DC | PRN

## 2023-08-30 MED ORDER — KCL IN DEXTROSE-NACL 20-5-0.45 MEQ/L-%-% IV SOLN
INTRAVENOUS | Status: AC
  Filled 2023-08-30: qty 1000

## 2023-08-30 MED ORDER — ENSURE PRE-SURGERY PO LIQD: 296.0000 mL | Freq: Once | ORAL | Status: DC

## 2023-08-30 MED ORDER — ALVIMOPAN 12 MG PO CAPS
12.0000 mg | ORAL_CAPSULE | ORAL | Status: AC
  Administered 2023-08-30: 12 mg via ORAL
  Filled 2023-08-30: qty 1

## 2023-08-30 MED ORDER — BUPIVACAINE-EPINEPHRINE (PF) 0.25% -1:200000 IJ SOLN
INTRAMUSCULAR | Status: AC
Start: 2023-08-30 — End: ?
  Filled 2023-08-30: qty 30

## 2023-08-30 MED ORDER — FENTANYL CITRATE (PF) 100 MCG/2ML IJ SOLN
INTRAMUSCULAR | Status: AC
  Filled 2023-08-30: qty 2

## 2023-08-30 MED ORDER — METHOCARBAMOL 1000 MG/10ML IJ SOLN: 1000.0000 mg | Freq: Four times a day (QID) | INTRAMUSCULAR | Status: DC | PRN

## 2023-08-30 MED ORDER — ORAL CARE MOUTH RINSE: 15.0000 mL | Freq: Once | OROMUCOSAL | Status: AC

## 2023-08-30 MED ORDER — SODIUM CHLORIDE 0.9% FLUSH: 3.0000 mL | INTRAVENOUS | Status: DC | PRN

## 2023-08-30 MED ORDER — MIDAZOLAM HCL 2 MG/2ML IJ SOLN
INTRAMUSCULAR | Status: DC | PRN
  Administered 2023-08-30: 2 mg via INTRAVENOUS

## 2023-08-30 MED ORDER — SALINE SPRAY 0.65 % NA SOLN: 1.0000 | Freq: Four times a day (QID) | NASAL | Status: DC | PRN

## 2023-08-30 MED ORDER — TAB-A-VITE/IRON PO TABS
1.0000 | ORAL_TABLET | Freq: Every day | ORAL | Status: DC
  Administered 2023-08-30 – 2023-09-01 (×3): 1 via ORAL
  Filled 2023-08-30 (×4): qty 1

## 2023-08-30 MED ORDER — HYDROMORPHONE HCL 1 MG/ML IJ SOLN
0.5000 mg | INTRAMUSCULAR | Status: DC | PRN
  Administered 2023-08-30 – 2023-08-31 (×2): 1 mg via INTRAVENOUS
  Filled 2023-08-30 (×2): qty 1

## 2023-08-30 MED ORDER — MIDAZOLAM HCL 2 MG/2ML IJ SOLN
INTRAMUSCULAR | Status: AC
  Filled 2023-08-30: qty 2

## 2023-08-30 MED ORDER — GABAPENTIN 100 MG PO CAPS
200.0000 mg | ORAL_CAPSULE | ORAL | Status: AC
  Administered 2023-08-30: 200 mg via ORAL
  Filled 2023-08-30: qty 2

## 2023-08-30 MED ORDER — SUGAMMADEX SODIUM 200 MG/2ML IV SOLN
INTRAVENOUS | Status: DC | PRN
  Administered 2023-08-30: 150 mg via INTRAVENOUS

## 2023-08-30 MED ORDER — FENTANYL CITRATE PF 50 MCG/ML IJ SOSY: 25.0000 ug | PREFILLED_SYRINGE | INTRAMUSCULAR | Status: DC | PRN

## 2023-08-30 MED ORDER — NEOMYCIN SULFATE 500 MG PO TABS
1000.0000 mg | ORAL_TABLET | ORAL | Status: DC
Start: 2023-08-30 — End: 2023-08-30

## 2023-08-30 MED ORDER — BUPIVACAINE-EPINEPHRINE (PF) 0.25% -1:200000 IJ SOLN
INTRAMUSCULAR | Status: DC | PRN
Start: 2023-08-30 — End: 2023-08-30
  Administered 2023-08-30: 60 mL

## 2023-08-30 MED ORDER — OXYCODONE HCL 5 MG/5ML PO SOLN: 5.0000 mg | Freq: Once | ORAL | Status: DC | PRN

## 2023-08-30 MED ORDER — SODIUM CHLORIDE 0.9 % IV SOLN
2.0000 g | Freq: Two times a day (BID) | INTRAVENOUS | Status: AC
  Administered 2023-08-30: 2 g via INTRAVENOUS
  Filled 2023-08-30: qty 2

## 2023-08-30 MED ORDER — ASPIRIN 81 MG PO TBEC
81.0000 mg | DELAYED_RELEASE_TABLET | Freq: Every day | ORAL | Status: DC
  Administered 2023-08-31 – 2023-09-01 (×2): 81 mg via ORAL
  Filled 2023-08-30 (×2): qty 1

## 2023-08-30 MED ORDER — METHOCARBAMOL 500 MG PO TABS
1000.0000 mg | ORAL_TABLET | Freq: Four times a day (QID) | ORAL | Status: DC | PRN
  Administered 2023-08-31: 1000 mg via ORAL
  Filled 2023-08-30: qty 2

## 2023-08-30 MED ORDER — DEXAMETHASONE SODIUM PHOSPHATE 10 MG/ML IJ SOLN
INTRAMUSCULAR | Status: DC | PRN
  Administered 2023-08-30: 8 mg via INTRAVENOUS

## 2023-08-30 MED ORDER — ONDANSETRON HCL 4 MG/2ML IJ SOLN
INTRAMUSCULAR | Status: AC
  Filled 2023-08-30: qty 4

## 2023-08-30 MED ORDER — VITAMIN C 500 MG PO TABS
500.0000 mg | ORAL_TABLET | Freq: Every day | ORAL | Status: DC
  Administered 2023-08-31 – 2023-09-01 (×2): 500 mg via ORAL
  Filled 2023-08-30 (×2): qty 1

## 2023-08-30 MED ORDER — 0.9 % SODIUM CHLORIDE (POUR BTL) OPTIME
TOPICAL | Status: DC | PRN
  Administered 2023-08-30 (×2): 1000 mL

## 2023-08-30 MED ORDER — ATENOLOL 25 MG PO TABS
50.0000 mg | ORAL_TABLET | Freq: Every day | ORAL | Status: DC
  Administered 2023-08-31 – 2023-09-01 (×2): 50 mg via ORAL
  Filled 2023-08-30 (×2): qty 2

## 2023-08-30 MED ORDER — ENSURE SURGERY PO LIQD
237.0000 mL | Freq: Two times a day (BID) | ORAL | Status: DC
  Administered 2023-08-31 – 2023-09-01 (×4): 237 mL via ORAL

## 2023-08-30 MED ORDER — STERILE WATER FOR INJECTION IJ SOLN
INTRAMUSCULAR | Status: AC
  Filled 2023-08-30: qty 10

## 2023-08-30 MED ORDER — OXYCODONE HCL 5 MG PO TABS: 5.0000 mg | ORAL_TABLET | Freq: Once | ORAL | Status: DC | PRN

## 2023-08-30 MED ORDER — MENTHOL 3 MG MT LOZG: 1.0000 | LOZENGE | OROMUCOSAL | Status: DC | PRN

## 2023-08-30 MED ORDER — ONDANSETRON HCL 4 MG/2ML IJ SOLN
INTRAMUSCULAR | Status: DC | PRN
  Administered 2023-08-30: 4 mg via INTRAVENOUS

## 2023-08-30 MED ORDER — SODIUM CHLORIDE 0.9 % IV SOLN
2.0000 g | INTRAVENOUS | Status: AC
  Administered 2023-08-30: 2 g via INTRAVENOUS
  Filled 2023-08-30: qty 2

## 2023-08-30 MED ORDER — PROCHLORPERAZINE MALEATE 10 MG PO TABS: 10.0000 mg | ORAL_TABLET | Freq: Four times a day (QID) | ORAL | Status: DC | PRN

## 2023-08-30 MED ORDER — BUPIVACAINE LIPOSOME 1.3 % IJ SUSP
INTRAMUSCULAR | Status: DC | PRN
  Administered 2023-08-30: 20 mL

## 2023-08-30 MED ORDER — BUPIVACAINE LIPOSOME 1.3 % IJ SUSP
INTRAMUSCULAR | Status: AC
  Filled 2023-08-30: qty 20

## 2023-08-30 MED ORDER — CALCIUM POLYCARBOPHIL 625 MG PO TABS
625.0000 mg | ORAL_TABLET | Freq: Two times a day (BID) | ORAL | Status: DC
  Administered 2023-08-30 – 2023-09-01 (×5): 625 mg via ORAL
  Filled 2023-08-30 (×5): qty 1

## 2023-08-30 MED ORDER — MAGIC MOUTHWASH: 15.0000 mL | Freq: Four times a day (QID) | ORAL | Status: DC | PRN

## 2023-08-30 MED ORDER — GABAPENTIN 100 MG PO CAPS
200.0000 mg | ORAL_CAPSULE | Freq: Every day | ORAL | Status: DC
  Administered 2023-08-30 – 2023-09-01 (×3): 200 mg via ORAL
  Filled 2023-08-30 (×3): qty 2

## 2023-08-30 MED ORDER — LACTATED RINGERS IV SOLN: Freq: Three times a day (TID) | INTRAVENOUS | Status: AC | PRN

## 2023-08-30 MED ORDER — ACETAMINOPHEN 500 MG PO TABS
500.0000 mg | ORAL_TABLET | Freq: Four times a day (QID) | ORAL | Status: DC
  Administered 2023-08-30 – 2023-09-02 (×5): 500 mg via ORAL
  Filled 2023-08-30 (×6): qty 1

## 2023-08-30 MED ORDER — PHENYLEPHRINE HCL-NACL 20-0.9 MG/250ML-% IV SOLN
INTRAVENOUS | Status: DC | PRN
  Administered 2023-08-30: 40 ug/min via INTRAVENOUS

## 2023-08-30 MED ORDER — DIPHENHYDRAMINE HCL 50 MG/ML IJ SOLN: 12.5000 mg | Freq: Four times a day (QID) | INTRAMUSCULAR | Status: DC | PRN

## 2023-08-30 MED ORDER — BUPIVACAINE LIPOSOME 1.3 % IJ SUSP: 20.0000 mL | Freq: Once | INTRAMUSCULAR | Status: DC

## 2023-08-30 MED ORDER — ONDANSETRON HCL 4 MG/2ML IJ SOLN: 4.0000 mg | Freq: Four times a day (QID) | INTRAMUSCULAR | Status: DC | PRN

## 2023-08-30 MED ORDER — ROCURONIUM BROMIDE 10 MG/ML (PF) SYRINGE
PREFILLED_SYRINGE | INTRAVENOUS | Status: DC | PRN
  Administered 2023-08-30 (×2): 20 mg via INTRAVENOUS
  Administered 2023-08-30: 70 mg via INTRAVENOUS

## 2023-08-30 MED ORDER — PHENYLEPHRINE 80 MCG/ML (10ML) SYRINGE FOR IV PUSH (FOR BLOOD PRESSURE SUPPORT)
PREFILLED_SYRINGE | INTRAVENOUS | Status: AC
  Filled 2023-08-30: qty 10

## 2023-08-30 MED ORDER — PHENOL 1.4 % MT LIQD: 2.0000 | OROMUCOSAL | Status: DC | PRN

## 2023-08-30 MED ORDER — DEXAMETHASONE SODIUM PHOSPHATE 10 MG/ML IJ SOLN
INTRAMUSCULAR | Status: AC
  Filled 2023-08-30: qty 1

## 2023-08-30 MED ORDER — PROPOFOL 10 MG/ML IV BOLUS
INTRAVENOUS | Status: DC | PRN
  Administered 2023-08-30: 150 mg via INTRAVENOUS

## 2023-08-30 MED ORDER — CHLORHEXIDINE GLUCONATE 0.12 % MT SOLN
15.0000 mL | Freq: Once | OROMUCOSAL | Status: AC
  Administered 2023-08-30: 15 mL via OROMUCOSAL

## 2023-08-30 MED ORDER — SUGAMMADEX SODIUM 200 MG/2ML IV SOLN
INTRAVENOUS | Status: AC
  Filled 2023-08-30: qty 2

## 2023-08-30 MED ORDER — SIMETHICONE 80 MG PO CHEW: 40.0000 mg | CHEWABLE_TABLET | Freq: Four times a day (QID) | ORAL | Status: DC | PRN

## 2023-08-30 MED ORDER — LIDOCAINE HCL (PF) 2 % IJ SOLN
INTRAMUSCULAR | Status: AC
  Filled 2023-08-30: qty 5

## 2023-08-30 MED ORDER — METOPROLOL TARTRATE 5 MG/5ML IV SOLN: 5.0000 mg | Freq: Four times a day (QID) | INTRAVENOUS | Status: DC | PRN

## 2023-08-30 MED ORDER — POLYETHYLENE GLYCOL 3350 17 GM/SCOOP PO POWD: 1.0000 | Freq: Once | ORAL | Status: DC

## 2023-08-30 MED ORDER — ALUM & MAG HYDROXIDE-SIMETH 200-200-20 MG/5ML PO SUSP: 30.0000 mL | Freq: Four times a day (QID) | ORAL | Status: DC | PRN

## 2023-08-30 MED ORDER — PROPOFOL 10 MG/ML IV BOLUS
INTRAVENOUS | Status: AC
  Filled 2023-08-30: qty 20

## 2023-08-30 MED ORDER — NAPHAZOLINE-GLYCERIN 0.012-0.25 % OP SOLN: 1.0000 [drp] | Freq: Four times a day (QID) | OPHTHALMIC | Status: DC | PRN

## 2023-08-30 MED ORDER — SODIUM CHLORIDE 0.9% FLUSH
3.0000 mL | Freq: Two times a day (BID) | INTRAVENOUS | Status: DC
  Administered 2023-08-30 – 2023-09-01 (×5): 3 mL via INTRAVENOUS

## 2023-08-30 MED ORDER — LIDOCAINE HCL (CARDIAC) PF 100 MG/5ML IV SOSY
PREFILLED_SYRINGE | INTRAVENOUS | Status: DC | PRN
Start: 2023-08-30 — End: 2023-08-30
  Administered 2023-08-30: 60 mg via INTRATRACHEAL

## 2023-08-30 MED ORDER — VITAMIN D 25 MCG (1000 UNIT) PO TABS
1000.0000 [IU] | ORAL_TABLET | Freq: Every day | ORAL | Status: DC
  Administered 2023-08-31 – 2023-09-01 (×2): 1000 [IU] via ORAL
  Filled 2023-08-30 (×2): qty 1

## 2023-08-30 MED ORDER — LACTATED RINGERS IV SOLN: INTRAVENOUS | Status: DC | PRN

## 2023-08-30 MED ORDER — CELECOXIB 200 MG PO CAPS
200.0000 mg | ORAL_CAPSULE | ORAL | Status: AC
  Administered 2023-08-30: 200 mg via ORAL
  Filled 2023-08-30: qty 1

## 2023-08-30 MED ORDER — KETAMINE HCL 50 MG/5ML IJ SOSY
PREFILLED_SYRINGE | INTRAMUSCULAR | Status: AC
  Filled 2023-08-30: qty 5

## 2023-08-30 MED ORDER — PHENYLEPHRINE HCL-NACL 20-0.9 MG/250ML-% IV SOLN
INTRAVENOUS | Status: AC
  Filled 2023-08-30: qty 250

## 2023-08-30 MED ORDER — ACETAMINOPHEN 500 MG PO TABS
1000.0000 mg | ORAL_TABLET | ORAL | Status: AC
  Administered 2023-08-30: 1000 mg via ORAL
  Filled 2023-08-30: qty 2

## 2023-08-30 MED ORDER — HYDROMORPHONE HCL 2 MG/ML IJ SOLN
INTRAMUSCULAR | Status: AC
  Filled 2023-08-30: qty 1

## 2023-08-30 MED ORDER — ONDANSETRON HCL 4 MG PO TABS
4.0000 mg | ORAL_TABLET | Freq: Four times a day (QID) | ORAL | Status: DC | PRN
  Administered 2023-09-01: 4 mg via ORAL
  Filled 2023-08-30: qty 1

## 2023-08-30 MED ORDER — ENOXAPARIN SODIUM 40 MG/0.4ML IJ SOSY
40.0000 mg | PREFILLED_SYRINGE | INTRAMUSCULAR | Status: DC
  Administered 2023-08-31 – 2023-09-02 (×3): 40 mg via SUBCUTANEOUS
  Filled 2023-08-30 (×3): qty 0.4

## 2023-08-30 MED ORDER — HYDRALAZINE HCL 20 MG/ML IJ SOLN: 10.0000 mg | INTRAMUSCULAR | Status: DC | PRN

## 2023-08-30 MED ORDER — BISACODYL 5 MG PO TBEC: 20.0000 mg | DELAYED_RELEASE_TABLET | Freq: Once | ORAL | Status: DC

## 2023-08-30 MED ORDER — PROCHLORPERAZINE EDISYLATE 10 MG/2ML IJ SOLN
5.0000 mg | Freq: Four times a day (QID) | INTRAMUSCULAR | Status: DC | PRN
  Administered 2023-09-01: 10 mg via INTRAVENOUS
  Filled 2023-08-30: qty 2

## 2023-08-30 MED ORDER — KETAMINE HCL 50 MG/5ML IJ SOSY
PREFILLED_SYRINGE | INTRAMUSCULAR | Status: DC | PRN
  Administered 2023-08-30: 15 mg via INTRAVENOUS
  Administered 2023-08-30: 25 mg via INTRAVENOUS
  Administered 2023-08-30: 10 mg via INTRAVENOUS

## 2023-08-30 MED ORDER — DIPHENHYDRAMINE HCL 12.5 MG/5ML PO ELIX: 12.5000 mg | ORAL_SOLUTION | Freq: Four times a day (QID) | ORAL | Status: DC | PRN

## 2023-08-30 MED ORDER — LACTATED RINGERS IV SOLN
INTRAVENOUS | Status: DC
Start: 2023-08-30 — End: 2023-08-30

## 2023-08-30 MED ORDER — ONDANSETRON HCL 4 MG/2ML IJ SOLN: 4.0000 mg | Freq: Once | INTRAMUSCULAR | Status: DC | PRN

## 2023-08-30 MED ORDER — ENOXAPARIN SODIUM 40 MG/0.4ML IJ SOSY
40.0000 mg | PREFILLED_SYRINGE | Freq: Once | INTRAMUSCULAR | Status: AC
  Administered 2023-08-30: 40 mg via SUBCUTANEOUS
  Filled 2023-08-30: qty 0.4

## 2023-08-30 SURGICAL SUPPLY — 105 items
APPLIER CLIP 5 13 M/L LIGAMAX5 (MISCELLANEOUS) IMPLANT
APPLIER CLIP ROT 10 11.4 M/L (STAPLE) IMPLANT
BAG COUNTER SPONGE SURGICOUNT (BAG) ×1 IMPLANT
BAG URO CATCHER STRL LF (MISCELLANEOUS) ×1 IMPLANT
BLADE EXTENDED COATED 6.5IN (ELECTRODE) IMPLANT
CANNULA REDUCER 12-8 DVNC XI (CANNULA) IMPLANT
CATH URETL OPEN 5X70 (CATHETERS) IMPLANT
CATH URETL OPEN END 6FR 70 (CATHETERS) IMPLANT
CELLS DAT CNTRL 66122 CELL SVR (MISCELLANEOUS) IMPLANT
CHLORAPREP W/TINT 26 (MISCELLANEOUS) IMPLANT
CLIP APPLIE 5 13 M/L LIGAMAX5 (MISCELLANEOUS) IMPLANT
CLIP APPLIE ROT 10 11.4 M/L (STAPLE) IMPLANT
CLOTH BEACON ORANGE TIMEOUT ST (SAFETY) ×1 IMPLANT
COVER SURGICAL LIGHT HANDLE (MISCELLANEOUS) ×2 IMPLANT
COVER TIP SHEARS 8 DVNC (MISCELLANEOUS) ×1 IMPLANT
DEVICE TROCAR PUNCTURE CLOSURE (ENDOMECHANICALS) IMPLANT
DRAIN CHANNEL 19F RND (DRAIN) IMPLANT
DRAPE ARM DVNC X/XI (DISPOSABLE) ×4 IMPLANT
DRAPE COLUMN DVNC XI (DISPOSABLE) ×1 IMPLANT
DRAPE SURG IRRIG POUCH 19X23 (DRAPES) ×1 IMPLANT
DRIVER NDL LRG 8 DVNC XI (INSTRUMENTS) ×1 IMPLANT
DRIVER NDLE LRG 8 DVNC XI (INSTRUMENTS) ×1 IMPLANT
DRSG OPSITE POSTOP 4X10 (GAUZE/BANDAGES/DRESSINGS) IMPLANT
DRSG OPSITE POSTOP 4X6 (GAUZE/BANDAGES/DRESSINGS) IMPLANT
DRSG OPSITE POSTOP 4X8 (GAUZE/BANDAGES/DRESSINGS) IMPLANT
DRSG TEGADERM 2-3/8X2-3/4 SM (GAUZE/BANDAGES/DRESSINGS) ×5 IMPLANT
DRSG TEGADERM 4X4.75 (GAUZE/BANDAGES/DRESSINGS) IMPLANT
ELECT PENCIL ROCKER SW 15FT (MISCELLANEOUS) ×1 IMPLANT
ELECT REM PT RETURN 15FT ADLT (MISCELLANEOUS) ×1 IMPLANT
ENDOLOOP SUT PDS II 0 18 (SUTURE) IMPLANT
EVACUATOR SILICONE 100CC (DRAIN) IMPLANT
GAUZE SPONGE 2X2 8PLY STRL LF (GAUZE/BANDAGES/DRESSINGS) ×1 IMPLANT
GLOVE BIOGEL M 7.0 STRL (GLOVE) ×1 IMPLANT
GLOVE ECLIPSE 8.0 STRL XLNG CF (GLOVE) ×3 IMPLANT
GLOVE INDICATOR 8.0 STRL GRN (GLOVE) ×3 IMPLANT
GOWN SRG XL LVL 4 BRTHBL STRL (GOWNS) ×1 IMPLANT
GOWN STRL REUS W/ TWL XL LVL3 (GOWN DISPOSABLE) ×5 IMPLANT
GRASPER SUT TROCAR 14GX15 (MISCELLANEOUS) IMPLANT
GRASPER TIP-UP FEN DVNC XI (INSTRUMENTS) ×1 IMPLANT
GUIDEWIRE SENSOR ANG DUAL FLEX (WIRE) IMPLANT
GUIDEWIRE STR DUAL SENSOR (WIRE) IMPLANT
GUIDEWIRE ZIPWRE .038 STRAIGHT (WIRE) IMPLANT
HOLDER FOLEY CATH W/STRAP (MISCELLANEOUS) ×1 IMPLANT
IRRIG SUCT STRYKERFLOW 2 WTIP (MISCELLANEOUS) ×1 IMPLANT
IRRIGATION SUCT STRKRFLW 2 WTP (MISCELLANEOUS) ×1 IMPLANT
KIT PROCEDURE DVNC SI (MISCELLANEOUS) ×1 IMPLANT
KIT SIGMOIDOSCOPE (SET/KITS/TRAYS/PACK) IMPLANT
KIT TURNOVER KIT A (KITS) IMPLANT
MANIFOLD NEPTUNE II (INSTRUMENTS) ×1 IMPLANT
NDL INSUFFLATION 14GA 120MM (NEEDLE) ×1 IMPLANT
NEEDLE INSUFFLATION 14GA 120MM (NEEDLE) ×1 IMPLANT
PACK CARDIOVASCULAR III (CUSTOM PROCEDURE TRAY) ×1 IMPLANT
PACK COLON (CUSTOM PROCEDURE TRAY) ×1 IMPLANT
PACK CYSTO (CUSTOM PROCEDURE TRAY) ×1 IMPLANT
PAD POSITIONING PINK XL (MISCELLANEOUS) ×1 IMPLANT
PROTECTOR NERVE ULNAR (MISCELLANEOUS) ×2 IMPLANT
RELOAD STAPLE 45 3.5 BLU DVNC (STAPLE) IMPLANT
RELOAD STAPLE 45 4.3 GRN DVNC (STAPLE) IMPLANT
RELOAD STAPLE 60 3.5 BLU DVNC (STAPLE) IMPLANT
RELOAD STAPLE 60 4.3 GRN DVNC (STAPLE) IMPLANT
RELOAD STAPLER 3.5X45 BLU DVNC (STAPLE) IMPLANT
RELOAD STAPLER 3.5X60 BLU DVNC (STAPLE) IMPLANT
RELOAD STAPLER 4.3X45 GRN DVNC (STAPLE) IMPLANT
RELOAD STAPLER 4.3X60 GRN DVNC (STAPLE) ×1 IMPLANT
RETRACTOR WND ALEXIS 18 MED (MISCELLANEOUS) IMPLANT
RTRCTR WOUND ALEXIS 18CM MED (MISCELLANEOUS) IMPLANT
SCISSORS LAP 5X35 DISP (ENDOMECHANICALS) ×1 IMPLANT
SCISSORS MNPLR CVD DVNC XI (INSTRUMENTS) ×1 IMPLANT
SEAL UNIV 5-12 XI (MISCELLANEOUS) ×4 IMPLANT
SEALER VESSEL EXT DVNC XI (MISCELLANEOUS) ×1 IMPLANT
SOL ELECTROSURG ANTI STICK (MISCELLANEOUS) ×1 IMPLANT
SOLUTION ELECTROSURG ANTI STCK (MISCELLANEOUS) ×1 IMPLANT
SPIKE FLUID TRANSFER (MISCELLANEOUS) ×1 IMPLANT
STAPLER 45 SUREFORM DVNC (STAPLE) IMPLANT
STAPLER 60 SUREFORM DVNC (STAPLE) IMPLANT
STAPLER ECHELON POWER CIR 29 (STAPLE) IMPLANT
STAPLER ECHELON POWER CIR 31 (STAPLE) IMPLANT
STAPLER RELOAD 3.5X45 BLU DVNC (STAPLE) IMPLANT
STAPLER RELOAD 3.5X60 BLU DVNC (STAPLE) IMPLANT
STAPLER RELOAD 4.3X45 GRN DVNC (STAPLE) IMPLANT
STAPLER RELOAD 4.3X60 GRN DVNC (STAPLE) ×1 IMPLANT
STOPCOCK 4 WAY LG BORE MALE ST (IV SETS) ×2 IMPLANT
SURGILUBE 2OZ TUBE FLIPTOP (MISCELLANEOUS) IMPLANT
SUT MNCRL AB 4-0 PS2 18 (SUTURE) ×1 IMPLANT
SUT PDS AB 1 CT1 27 (SUTURE) ×2 IMPLANT
SUT PROLENE 0 CT 2 (SUTURE) IMPLANT
SUT PROLENE 2 0 KS (SUTURE) IMPLANT
SUT PROLENE 2 0 SH DA (SUTURE) IMPLANT
SUT SILK 2 0 SH CR/8 (SUTURE) IMPLANT
SUT SILK 3 0 SH CR/8 (SUTURE) ×1 IMPLANT
SUT V-LOC BARB 180 2/0GR6 GS22 (SUTURE) IMPLANT
SUT VIC AB 3-0 SH 18 (SUTURE) IMPLANT
SUT VIC AB 3-0 SH 27XBRD (SUTURE) IMPLANT
SUT VICRYL 0 UR6 27IN ABS (SUTURE) IMPLANT
SUTURE V-LC BRB 180 2/0GR6GS22 (SUTURE) IMPLANT
SYR 20ML ECCENTRIC (SYRINGE) ×1 IMPLANT
SYS LAPSCP GELPORT 120MM (MISCELLANEOUS) IMPLANT
SYS WOUND ALEXIS 18CM MED (MISCELLANEOUS) ×1 IMPLANT
SYSTEM LAPSCP GELPORT 120MM (MISCELLANEOUS) IMPLANT
SYSTEM WOUND ALEXIS 18CM MED (MISCELLANEOUS) ×1 IMPLANT
TRAY FOLEY MTR SLVR 16FR STAT (SET/KITS/TRAYS/PACK) ×1 IMPLANT
TROCAR ADV FIXATION 5X100MM (TROCAR) ×1 IMPLANT
TUBING CONNECTING 10 (TUBING) ×3 IMPLANT
TUBING INSUFFLATION 10FT LAP (TUBING) ×1 IMPLANT
TUBING UROLOGY SET (TUBING) IMPLANT
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
## 2023-08-30 NOTE — Anesthesia Preprocedure Evaluation (Addendum)
 Anesthesia Evaluation  Patient identified by MRN, date of birth, ID band Patient awake    Reviewed: Allergy & Precautions, NPO status , Patient's Chart, lab work & pertinent test results  History of Anesthesia Complications Negative for: history of anesthetic complications  Airway Mallampati: II  TM Distance: >3 FB Neck ROM: Full    Dental  (+) Dental Advisory Given   Pulmonary neg pulmonary ROS   Pulmonary exam normal        Cardiovascular hypertension, Pt. on medications and Pt. on home beta blockers Normal cardiovascular exam     Neuro/Psych  PSYCHIATRIC DISORDERS Anxiety Depression    negative neurological ROS     GI/Hepatic Neg liver ROS,GERD  Controlled,, Diverticulitis    Endo/Other  negative endocrine ROS    Renal/GU negative Renal ROS     Musculoskeletal  (+) Arthritis ,    Abdominal   Peds  Hematology  (+) Blood dyscrasia, anemia   Anesthesia Other Findings   Reproductive/Obstetrics                             Anesthesia Physical Anesthesia Plan  ASA: 2  Anesthesia Plan: General   Post-op Pain Management: Tylenol PO (pre-op)*, Celebrex PO (pre-op)* and Gabapentin PO (pre-op)*   Induction: Intravenous  PONV Risk Score and Plan: 3 and Treatment may vary due to age or medical condition, Ondansetron, Dexamethasone and Midazolam  Airway Management Planned: Oral ETT  Additional Equipment: None  Intra-op Plan:   Post-operative Plan: Extubation in OR  Informed Consent: I have reviewed the patients History and Physical, chart, labs and discussed the procedure including the risks, benefits and alternatives for the proposed anesthesia with the patient or authorized representative who has indicated his/her understanding and acceptance.     Dental advisory given  Plan Discussed with: CRNA and Anesthesiologist  Anesthesia Plan Comments:         Anesthesia Quick  Evaluation

## 2023-08-30 NOTE — Progress Notes (Signed)
 I met Mrs. Timmins after her spouse, Jorja Loa, volunteered to witness a HCPOA being signed.  Mrs. Upperman shared with me some of her hopes and relief following surgery. Her and her spouse debriefed with me and this led to a conversation about beliefs and values meaningful to them.  I provided compassionate presence and both active and reflective listening. I invited reflection around some impactful experiences that have helped shape their faith and belief. I offer words of encouragement and let them know spiritual care remains available.  Terresa Marlett L. Sophronia Simas, M.Div [phone]

## 2023-08-30 NOTE — Op Note (Addendum)
 08/30/2023  1:40 PM  PATIENT:  Sheila Lin  63 y.o. female  Patient Care Team: [name], [name], MD as PCP - General Meisinger, [name], MD as Consulting Physician (Obstetrics and Gynecology) Mansouraty, [name]., MD as Consulting Physician (Gastroenterology) [name], MD as Consulting Physician (Colon and Rectal Surgery)  PRE-OPERATIVE DIAGNOSIS:   POST-OPERATIVE DIAGNOSIS:  DIVERTICULITIS WITH ABSCESS  PROCEDURE:   -ROBOTIC LOW ANTERIOR RECTOSIGMOID RESECTION WITH ANASTOMOSIS -ROBOTIC DRAINAGE OF PELVIC ABSCESS -INTRAOPERATIVE ASSESSMENT OF TISSUE VASCULAR PERFUSION USING ICG (indocyanine green) IMMUNOFLUORESCENCE -TRANSVERSUS ABDOMINIS PLANE (TAP) BLOCK - BILATERAL  SURGEON:  [name], MD  ASSISTANT:  Feliciana Rossetti, MD  An experienced assistant was required given the standard of surgical care given the complexity of the case.  This assistant was needed for exposure, dissection, suction, tissue approximation, retraction, perception, etc  ANESTHESIA:  General endotracheal intubation anesthesia (GETA) and Regional TRANSVERSUS ABDOMINIS PLANE (TAP) nerve block -BILATERAL for perioperative & postoperative pain control at the level of the transverse abdominis & preperitoneal spaces along the flank at the anterior axillary line, from subcostal ridge to iliac crest under laparoscopic guidance provided with liposomal bupivacaine (Experel) 20mL mixed with 60 mL of bupivicaine 0.25% with epinephrine  Estimated Blood Loss (EBL):   Total I/O In: 100 [IV Piggyback:100] Out: 150 [Urine:150].   (See anesthesia record)  Delay start of Pharmacological VTE agent (>24hrs) due to concerns of significant anemia, surgical blood loss, or risk of bleeding?:  no  DRAINS: (None)  SPECIMEN:  Rectosigmoid (open end proximal) and Distal anastomotic ring (FINAL DISTAL MARGIN)  DISPOSITION OF SPECIMEN:  Pathology  COUNTS:  Sponge, needle, & instrument counts CORRECT  PLAN OF  CARE: Discharge to home after PACU  PATIENT DISPOSITION:  PACU - hemodynamically stable.  INDICATION:    Patient with episodes of diverticulitis with more severe episode resulting in abscess and hospitalization.  Colonoscopy noted inflammation and stricture but benign.  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  Improved on antibiotics.  I recommended segmental resection:  The anatomy & physiology of the digestive tract was discussed.  The pathophysiology was discussed.  Natural history risks without surgery was discussed.   I worked to give an overview of the disease and the frequent need to have multispecialty involvement.  I feel the risks of no intervention will lead to serious problems that outweigh the operative risks; therefore, I recommended a partial colectomy to remove the pathology.  Laparoscopic & open techniques were discussed.    Risks such as bleeding, infection, abscess, leak, reoperation, possible ostomy, hernia, heart attack, death, and other risks were discussed.  I noted a good likelihood this will help address the problem.   Goals of post-operative recovery were discussed as well.  We will work to minimize complications.  Educational materials on the pathology had been given in the office.  Questions were answered.    The patient expressed understanding & wished to proceed with surgery.  OR FINDINGS:  Patient had concrete phlegmon of rectosigmoid colon going on to proximal rectum densely adherent to vaginal cuff and dome of bladder.  No evidence of colovesical or colovesical fistula.  Left lateral chronic abscess cavity - aspirated and abscess wall debrided.  No obvious metastatic disease on visceral parietal peritoneum or liver.  It is a 31mm EEA anastomosis ( distal descending colon  connected to proximal rectum.)  It rests 13 cm from the anal verge by flexible sigmoidoscopy  CASE DATA: Type of patient?: Elective [facility] Private Case  Status of Case?  Elective Scheduled Infection Present At Time Of Surgery (PATOS)?  ABSCESS & PHLEGMON   DESCRIPTION:   Informed consent was confirmed.  The patient underwent general anaesthesia without difficulty.  The patient was positioned appropriately.  VTE prevention in place.  Patient underwent cystoscopy by Dr. Cardell Peach with [facility] for firefly insufflation.  No obvious bladder or ureteral orifice issues the patient was clipped, prepped, & draped in a sterile fashion.  Surgical timeout confirmed our plan.  The patient was positioned in reverse Trendelenburg.  Abdominal entry was gained using Varess technique at the left subcostal ridge on the anterior abdominal wall.  No elevated EtCO2 noted.  Port placed.  Camera inspection revealed no injury.  Extra ports were carefully placed under direct laparoscopic visualization.  Upon entering the abdomen (organ space),we encountered a phlegmon involving the distal sigmoid and proximal rectum. .   I reflected the greater omentum and the upper abdomen the small bowel in the upper abdomen.  The patient was carefully positioned.  The [employer]uitive daVinci robot was docked with camera & instruments carefully placed.  I mobilized the rectosigmoid colon & elevated it to put the main pedicle on tension.  It was already somewhat foreshortened and tense.  I scored the base of peritoneum of the medial side of the mesentery of the elevated left colon from the ligament of Treitz to the mid rectum.   I elevated the sigmoid mesentery and entered into the retro-mesenteric plane. We were able to identify the left ureter and gonadal vessels. We kept those posterior within the retroperitoneum and elevated the left colon mesentery off that. I did isolate the inferior mesenteric artery (IMA) pedicle.  I skeletonized the lymph nodes off the inferior mesenteric artery pedicle.  I went down to its takeoff from the aorta.  I isolated the inferior mesenteric vein off of the ligament of Treitz just  cephalad to that as well.  After confirming the left ureter was out of the way, I went ahead and ligated the inferior mesenteric artery pedicle just near its takeoff from the aorta.  I did ligate the inferior mesenteric vein in a similar fashion.  We ensured hemostasis.    I continued medial to lateral dissection to free the left colon mesentery off the retroperitoneum going up towards the splenic flexure to allow good mobility and protect the colon mesentery.  I mobilized the left colon in a lateral to medial fashion off the retroperitoneum and sidewall attachments along the line of Toldt up towards the splenic flexure to ensure good mobilization of the remaining left colon to reach into the pelvis.  Able to come up to an slightly superior to the inferior pancreatic edge.  Then mobilized the left colon in a lateral medial fashion to help reflect the sigmoid colon towards the right lower quadrant.  This allowed [place] to gradually peel off the phlegmon off the left retroperitoneum and left lateral pelvic wall.  Encountered a small abscess cavity.  Purulence aspirated.  Trying between anterior posterior and lateral approaches and eventually freed off the concrete phlegmon adhesions of the twisted sigmoid.  Freed off adhesions seen sigmoid and rectum to help straighten it out.  Did some suction aspiration and irrigation to clean the area up.  She clearly had no right adnexa or uterus consistent with her prior hysterectomy and right salpingo-oophorectomy.  Left ovary could not be found and its location would have been within the abscess cavity.  Looking at Dr. Fredrich Birks prior reports he  cannot identify the left adnexa when he did the oophorectomy in 2016.  Certainly nothing significant remaining.  And if there was a fallopian tube it was lost in that abscess cavity.  Hemostasis looked good.  I continued distally and got into the avascular plane posterior to the right mesorectum, sparing the nervi ergentes.. This  allowed me to help mobilize the rectum as well by freeing the mesorectum off the sacrum.  I stayed away from the right and left ureters.  Worked to come and free off the left posterior mesorectum off the pelvis and up towards the densely adherent sidewall.  I kept the lateral vascular pedicles to the rectum intact.  Came around anteriorly such that I had good circumferential mesorectal excision and a good margin distal to the area of concern.  I then did digital rectal exam and passed blind EEA sizers transanally to get up to the proximal rectum from 25-33 to prove no stricture or narrowing.  I skeletonized the mesorectum at the proximal rectum.  We then chose a region for the proximal margin that would reach well for our planned anastomosis (distal descending colon).  Transected the colon mesentery radially to preserve good collateral and marginal artery blood supply.    To assess vascular perfusion of tissues, we asked anesthesia use intravenous  indocyanine green (ICG) with IV flush.  I switched to the NIR fluorescence (Firefly mode) imaging window on the daVinci robot platform.  We were able to see good light green visualization of blood vessels with good vascular perfusion of tissues, confirming good tissue perfusion of tissues (distal descending colon and proximal rectum) planned for anastomosis.  Then transected at the proximal noninflamed rectum for the distal margin with a 60 mm green load robotic stapler.  We created an extraction incision through a small Pfannenstiel incision in the suprapubic region.  Placed a wound protector.  I was able to eviscerate the rectosigmoid and descending colon out the wound.   I clamped the colon proximal to this area using a reusable pursestringer device.  Passed a 2-0 Keith needle. I transected at the descending/sigmoid junction with a scalpel. I got healthy bleeding mucosa.  We sent the rectosigmoid colon specimen off to go to pathology.  We sized the colon orifice.   I chose a 31mm EEA anvil stapler system.  I reinforced the prolene pursestring with interrupted silk "belt loop" sutures.  I placed the anvil to the open end of the proximal remaining colon and closed around it using the pursestring.    We did copious irrigation with crystalloid solution.  Hemostasis was good.  The distal end of the remaining colon easily reached down to the rectal stump, therefore, splenic flexure mobilization was not needed.      I scrubbed down and did gentle anal dilation and advanced the EEA stapler up the rectal stump. The spike was brought out at the provimal end of the rectal stump under direct visualization.   Dr. Sheliah Hatch  attached the anvil of the proximal colon the spike of the stapler. Anvil was tightened down and held clamped for 60 seconds.  Orientation was confirmed such that there is no twisting of the colon nor small bowel underneath the mesenteric defect. No concerning tension.  The EEA stapler was fired and held clamped for 30 seconds. The stapler was released & removed. Blue stitch is in the proximal ring.  Care was taken to ensure no other structures were incorporated within this either.  We noted 2 excellent anastomotic  rings.   The colon proximal to the anastomosis was then gently occluded. The pelvis was filled with sterile irrigation.  I did flexible sigmoidoscopy.  Noted the anastomosis was at 13 cm from the anal verge consistent with the proximal rectum.  Intact anastomosis without active bleeding.  No mucosal ischemia.  There was a negative air leak test. There was no tension of mesentery or bowel at the anastomosis.   Tissues looked viable.  Ureters & bowel uninjured.  The anastomosis looked healthy.  Because it was an isolated abscess that cleaned up rather well in the anastomosis was away from that region, I did not feel we needed to place a pelvic drain.  Dr. Sheliah Hatch agreed.  We made an attempt for greater omentum to come down to the pelvis but it would not  reach since it was foreshortened.    Endoluminal gas was evacuated.  Ports & wound protector removed.  We changed gloves & redraped the patient per colon SSI prevention protocol.  We aspirated the sterile irrigation.  Hemostasis was good.  Sterile unused instruments were used from this point.  I closed the skin at the port sites using Monocryl stitch and sterile dressing.  We assured hemostasis and the former ostomy wound.  Wound irrigated.  I closed the posterior rectus fascia with 0 Vicryl suture.  Anterior rectus fascia was closed using #1 PDS transversely.   Sterile dressing placed.   Patient is being extubated go to recovery room. I had discussed postop care with the patient in detail the office & in the holding area. Instructions are written. I discussed operative findings, updated the patient's status, discussed probable steps to recovery, and gave postoperative recommendations to the patient's son, Gennesis Hogland .  Recommendations were made.  Questions were answered.  He expressed understanding & appreciation.  [name], M.D., F.A.C.S. Gastrointestinal and Minimally Invasive Surgery [facility] [address], [address] [place], [place] [address] [phone] Main / Paging

## 2023-08-30 NOTE — Transfer of Care (Signed)
 Immediate Anesthesia Transfer of Care Note  Patient: Sheila Lin  Procedure(s) Performed: ROBOTIC RESECTION OF COLON RECTOSIGMOID LOWER ANTERIOR RESECTION, DRAINAGE OF PELVIC ABSCESS SIGMOIDOSCOPY, FLEXIBLE, TAP BLOCK, FIREFLY INJECTION CYSTOSCOPY with FIREFLY INJECTION  Patient Location: PACU  Anesthesia Type:General  Level of Consciousness: sedated and responds to stimulation  Airway & Oxygen Therapy: Patient Spontanous Breathing and Patient connected to face mask oxygen  Post-op Assessment: Report given to RN  Post vital signs: stable  Last Vitals:  Vitals Value Taken Time  BP 125/64 08/30/23 1352  Temp 36.8 C 08/30/23 1352  Pulse 64 08/30/23 1358  Resp 12 08/30/23 1358  SpO2 100 % 08/30/23 1358  Vitals shown include unfiled device data.  Last Pain:  Vitals:   08/30/23 1352  TempSrc:   PainSc: Asleep         Complications: No notable events documented.

## 2023-08-30 NOTE — Anesthesia Procedure Notes (Signed)
 Procedure Name: Intubation Date/Time: 08/30/2023 11:11 AM  Performed by: Micki Riley, CRNAPre-anesthesia Checklist: Patient identified, Emergency Drugs available, Suction available and Patient being monitored Patient Re-evaluated:Patient Re-evaluated prior to induction Oxygen Delivery Method: Circle System Utilized Preoxygenation: Pre-oxygenation with 100% oxygen Induction Type: IV induction Ventilation: Mask ventilation without difficulty Grade View: Grade II Tube type: Oral Tube size: 7.0 mm Number of attempts: 1 Airway Equipment and Method: Stylet and Oral airway Placement Confirmation: ETT inserted through vocal cords under direct vision, positive ETCO2 and breath sounds checked- equal and bilateral Secured at: 21 cm Tube secured with: Tape Dental Injury: Teeth and Oropharynx as per pre-operative assessment

## 2023-08-30 NOTE — H&P (Signed)
 08/30/2023    REFERRING PHYSICIAN: Room, Emergency  Patient Care Team: [name], [name], MD as PCP - General (Internal Medicine)  PROVIDER: Jarrett Soho, MD  [facility] MRN: [id number] DOB: 1960/07/23  SUBJECTIVE   Chief Complaint: New Consultation (Eval for diverticulitis)   Sheila Lin is a 63 y.o. female  who is seen today as an office consultation  at the request of DrOlean Ree  for evaluation of recurrent diverticulitis.  History of Present Illness:  Patient history of diverticulitis in the past. Crampy abdominal pain especially for the past several months. In October 2024 had left lower quadrant pain. Had scan concerning for colitis versus diverticulitis. Had a colonoscopy 05/30/2023 by Dr. Meridee Score with [facility] gastroenterology. Moderate inflammation with congestion and edema and erosions in the rectosigmoid specially distal sigmoid colon. Found to have some segmental colitis felt to be associated with diverticulitis (SCAD) and narrowing. Patient bounced back with diverticulitis and late January a couple weeks ago. Stabilized with antibiotics. Sent home on 2 weeks of Cipro/Flagyl. Felt no need for emergent Hartmann resection with colostomy. Outpatient follow-up to consider elective colon resection recommended.  Since she was discharged from the hospital couple weeks ago, she felt worse and went to [facility] 1/30. 2 cm abscess noted and aspirated. Felt better and went home a few days later on more antibiotics. She just finished a 10-day course today. She used to move her bowels every couple of days and was constipated now she is tending to run loose once or twice a day. Still with soreness in her lower abdomen. No problems with urination. No nausea or vomiting. No fevers or chills. She denies smoking but there is an area of tobacco on her clothes.  Medical History:  Past Medical History:  Diagnosis Date  Anemia   Patient Active Problem List  Diagnosis   Pericolonic abscess due to diverticulitis  Sigmoid diverticulitis   Past Surgical History:  Procedure Laterality Date  HYSTERECTOMY  LAPAROSCOPIC REMOVAL ECTOPIC PREGNANCY    No Known Allergies  Current Outpatient Medications on File Prior to Visit  Medication Sig Dispense Refill  ascorbic acid, vitamin C, (VITAMIN C) 1000 MG tablet Take 1,000 mg by mouth once daily  aspirin 81 MG EC tablet Take 81 mg by mouth once daily  atenoloL (TENORMIN) 50 MG tablet Take 50 mg by mouth once daily  cholecalciferol (VITAMIN D3) 2,000 unit tablet Take 2,000 Units by mouth once daily  multivit-minerals/folic acid (MULTIVITAMIN GUMMIES ORAL) Take by mouth   No current facility-administered medications on file prior to visit.   History reviewed. No pertinent family history.   Social History   Tobacco Use  Smoking Status Never  Smokeless Tobacco Never    Social History   Socioeconomic History  Marital status: Married  Tobacco Use  Smoking status: Never  Smokeless tobacco: Never  Vaping Use  Vaping status: Never Used  Substance and Sexual Activity  Alcohol use: Not Currently  Drug use: Never   Social Drivers of Health   Food Insecurity: No Food Insecurity (07/20/2023)  Received from [facility]  Hunger Vital Sign  Worried About Running Out of Food in the Last Year: Never true  Ran Out of Food in the Last Year: Never true  Transportation Needs: No Transportation Needs (07/20/2023)  Received from [facility] - Transportation  Lack of Transportation (Medical): No  Lack of Transportation (Non-Medical): No  Social Connections: Moderately Isolated (07/20/2023)  [facility]  Social Connection and Isolation Panel [  NHANES]  Frequency of Communication with Friends and Family: More than three times a week  Frequency of Social Gatherings with Friends and Family: Once a week  Attends Religious Services: Never  Database administrator or Organizations: No  Attends  Banker Meetings: Never  Marital Status: Married  Housing Stability: Unknown (08/05/2023)  Housing Stability Vital Sign  Homeless in the Last Year: No   ############################################################  Review of Systems: A complete review of systems (ROS) was obtained from the patient.  We have reviewed this information and discussed as appropriate with the patient.  See HPI as well for other pertinent ROS.  Constitutional: No fevers, chills, sweats. Weight stable Eyes: No vision changes, No discharge HENT: No sore throats, nasal drainage Lymph: No neck swelling, No bruising easily Pulmonary: No cough, productive sputum CV: No orthopnea, PND . No exertional chest/neck/shoulder/arm pain. Patient can walk 30 minutes without difficulty.   GI: No personal nor family history of GI/colon cancer, inflammatory bowel disease, irritable bowel syndrome, allergy such as Celiac Sprue, dietary/dairy problems, colitis, ulcers nor gastritis. No recent sick contacts/gastroenteritis. No travel outside the country. No changes in diet.  Renal: No UTIs, No hematuria Genital: No drainage, bleeding, masses Musculoskeletal: No severe joint pain. Good ROM major joints Skin: No sores or lesions Heme/Lymph: No easy bleeding. No swollen lymph nodes Neuro: No active seizures. No facial droop Psych: No hallucinations. No agitation  OBJECTIVE   Vitals:  08/05/23 1526 08/05/23 1529  BP: (!) 140/70  Pulse: 108  Temp: 36.8 C (98.3 F)  SpO2: 99%  Weight: 71.6 kg (157 lb 12.8 oz)  Height: 172.7 cm (5\' 8" )  PainSc: 0-No pain   Body mass index is 23.99 kg/m.  PHYSICAL EXAM:  Constitutional: Not cachectic. Hygeine adequate. Vitals signs as above. Not toxic or sickly. There is a smell of tobacco. Eyes: No glasses. Vision adequate,Pupils reactive, normal extraocular movements. Sclera nonicteric Neuro: CN II-XII intact. No major focal sensory defects. No major motor  deficits. Lymph: No head/neck/groin lymphadenopathy Psych: No severe agitation. No severe anxiety. Judgment & insight Adequate, Oriented x4, HENT: Normocephalic, Mucus membranes moist. No thrush. Hearing: adequate Neck: Supple, No tracheal deviation. No obvious thyromegaly Chest: No pain to chest wall compression. Good respiratory excursion. No audible wheezing CV: Pulses intact. regular. No major extremity edema Ext: No obvious deformity or contracture. Edema: Not present. No cyanosis Skin: No major subcutaneous nodules. Warm and dry Musculoskeletal: Severe joint rigidity not present. No obvious clubbing. No digital petechiae. Mobility: no assist device moving easily without restrictions  Abdomen: Flat Soft. Nondistended. Discomfort in lower abdomen. Nothing particularly focal or peritonitic.[name] Hernia: Not present. Diastasis recti: Not present. No hepatomegaly. No splenomegaly.  Genital/Pelvic: Inguinal hernia: Not present. Inguinal lymph nodes: without lymphadenopathy nor hidradenitis.   Rectal: (Deferred)    ###################################################################  Labs, Imaging and Diagnostic Testing:  Located in 'Care Everywhere' section of Epic EMR chart  PRIOR [facility] CLINIC NOTES:  Located in 'Care Everywhere' section of Epic EMR chart  SURGERY NOTES:  Not applicable  PATHOLOGY:  Not applicable  Assessment and Plan:  DIAGNOSES:  Diagnoses and all orders for this visit:  Pericolonic abscess due to diverticulitis  Sigmoid diverticulitis  Other orders - amoxicillin-clavulanate (AUGMENTIN) 875-125 mg tablet; Take 1 tablet (875 mg total) by mouth every 12 (twelve) hours for 10 days - fluconazole (DIFLUCAN) 150 MG tablet; Take 1 tablet (150 mg total) by mouth once daily for 5 doses    ASSESSMENT/PLAN  Worsening 3 months of  symptoms of left lower quadrant abdominal pain and crampiness with documentation of diverticulitis and development of small  abscess.  At some point she would benefit from segmental colonic resection. I daily a robotic sigmoid colectomy at the immediate anastomosis. With her having a recent abscess she is at increased risk for need for a temporary colostomy and multiple operations.   The anatomy & physiology of the digestive tract was discussed. The pathophysiology of the colon was discussed. Natural history risks without surgery was discussed. I feel the risks of no intervention will lead to serious problems that outweigh the operative risks; therefore, I recommended a partial colectomy to remove the pathology. Minimally invasive (Robotic/Laparoscopic) & open techniques were discussed.   Risks such as bleeding, infection, abscess, leak, reoperation, injury to other organs, need for repair of tissues / organs, possible ostomy, hernia, heart attack, stroke, death, and other risks were discussed. I noted a good likelihood this will help address the problem. Goals of post-operative recovery were discussed as well. Need for adequate nutrition, daily bowel regimen and healthy physical activity, to optimize recovery was noted as well. We will work to minimize complications. Educational materials were available as well. Questions were answered. The patient expresses understanding & wishes to proceed with surgery.   [name], MD, FACS, MASCRS Esophageal, Gastrointestinal & Colorectal Surgery Robotic and Minimally Invasive Surgery  [facility] [address] [place], [place] [address] [phone] Fax [phone]  CONTACT INFORMATION: Weekday (9AM-5PM): Call [facility] main office at [phone] Weeknight (5PM-9AM) or Weekend/Holiday: Check EPIC "Web Links" tab & use "AMION" (password " [email or web address]") for General Surgery [facility] coverage  Please, DO NOT use SecureChat  (it is not reliable communication to reach operating surgeons & will lead to a delay in care).    Epic staff messaging available for outptient concerns needing 1-2 business day response.      08/30/2023

## 2023-08-30 NOTE — Discharge Instructions (Signed)
 SURGERY: POST OP INSTRUCTIONS (Surgery for small bowel obstruction, colon resection, etc)   ######################################################################  EAT Gradually transition to a high fiber diet with a fiber supplement over the next few days after discharge  WALK Walk an hour a day.  Control your pain to do that.    CONTROL PAIN Control pain so that you can walk, sleep, tolerate sneezing/coughing, go up/down stairs.  HAVE A BOWEL MOVEMENT DAILY Keep your bowels regular to avoid problems.  OK to try a laxative to override constipation.  OK to use an antidairrheal to slow down diarrhea.  Call if not better after 2 tries  CALL IF YOU HAVE PROBLEMS/CONCERNS Call if you are still struggling despite following these instructions. Call if you have concerns not answered by these instructions  ######################################################################   DIET Follow a light diet the first few days at home.  Start with a bland diet such as soups, liquids, starchy foods, low fat foods, etc.  If you feel full, bloated, or constipated, stay on a ful liquid or pureed/blenderized diet for a few days until you feel better and no longer constipated. Be sure to drink plenty of fluids every day to avoid getting dehydrated (feeling dizzy, not urinating, etc.). Gradually add a fiber supplement to your diet over the next week.  Gradually get back to a regular solid diet.  Avoid fast food or heavy meals the first week as you are more likely to get nauseated. It is expected for your digestive tract to need a few months to get back to normal.  It is common for your bowel movements and stools to be irregular.  You will have occasional bloating and cramping that should eventually fade away.  Until you are eating solid food normally, off all pain medications, and back to regular activities; your bowels will not be normal. Focus on eating a low-fat, high fiber diet the rest of your life  (See Getting to Good Bowel Health, below).  CARE of your INCISION or WOUND  It is good for closed incisions and even open wounds to be washed every day.  Shower every day.  Short baths are fine.  Wash the incisions and wounds clean with soap & water.    You may leave closed incisions open to air if it is dry.   You may cover the incision with clean gauze & replace it after your daily shower for comfort.  TEGADERM:  You have clear gauze band-aid dressings over your closed incision(s).  Remove the dressings 2 days after surgery = 3/9.    If you have an open wound with a wound vac, see wound vac care instructions.    ACTIVITIES as tolerated Start light daily activities --- self-care, walking, climbing stairs-- beginning the day after surgery.  Gradually increase activities as tolerated.  Control your pain to be active.  Stop when you are tired.  Ideally, walk several times a day, eventually an hour a day.   Most people are back to most day-to-day activities in a few weeks.  It takes 4-8 weeks to get back to unrestricted, intense activity. If you can walk 30 minutes without difficulty, it is safe to try more intense activity such as jogging, treadmill, bicycling, low-impact aerobics, swimming, etc. Save the most intensive and strenuous activity for last (Usually 4-8 weeks after surgery) such as sit-ups, heavy lifting, contact sports, etc.  Refrain from any intense heavy lifting or straining until you are off narcotics for pain control.  You will have off  days, but things should improve week-by-week. DO NOT PUSH THROUGH PAIN.  Let pain be your guide: If it hurts to do something, don't do it.  Pain is your body warning you to avoid that activity for another week until the pain goes down. You may drive when you are no longer taking narcotic prescription pain medication, you can comfortably wear a seatbelt, and you can safely make sudden turns/stops to protect yourself without hesitating due to  pain. You may have sexual intercourse when it is comfortable. If it hurts to do something, stop.   MEDICATIONS Take your usually prescribed home medications unless otherwise directed.    Blood thinners:  You can restart any strong blood thinners after the second postoperative day  for example: COUMADIN (warfarin), XERELTO (rivaroxaban), ELIQUIS (apixaban), PLAVIX (clopidigrel), BRILINTA (ticagrelor), EFFIENT (prasugrel), PRADAXA (dabigatran), etc  Continue aspirin before & after surgery..     Some oozing/bleeding the first 1-2 weeks is common but should taper down & be small volume.    If you are passing many large clots or having uncontrolling bleeding, call your surgeon    PAIN CONTROL Pain after surgery or related to activity is often due to strain/injury to muscle, tendon, nerves and/or incisions.  This pain is usually short-term and will improve in a few months.  To help speed the process of healing and to get back to regular activity more quickly, DO THE FOLLOWING THINGS TOGETHER: Increase activity gradually.  DO NOT PUSH THROUGH PAIN Use Ice and/or Heat Try Gentle Massage and/or Stretching Take over the counter pain medication Take Narcotic prescription pain medication for more severe pain  Good pain control = faster recovery.  It is better to take more medicine to be more active than to stay in bed all day to avoid medications.  Increase activity gradually Avoid heavy lifting at first, then increase to lifting as tolerated over the next 6 weeks. Do not "push through" the pain.  Listen to your body and avoid positions and maneuvers than reproduce the pain.  Wait a few days before trying something more intense Walking an hour a day is encouraged to help your body recover faster and more safely.  Start slowly and stop when getting sore.  If you can walk 30 minutes without stopping or pain, you can try more intense activity (running, jogging, aerobics, cycling, swimming,  treadmill, sex, sports, weightlifting, etc.) Remember: If it hurts to do it, then don't do it! Use Ice and/or Heat You will have swelling and bruising around the incisions.  This will take several weeks to resolve. Ice packs or heating pads (6-8 times a day, 30-60 minutes at a time) will help sooth soreness & bruising. Some people prefer to use ice alone, heat alone, or alternate between ice & heat.  Experiment and see what works best for you.  Consider trying ice for the first few days to help decrease swelling and bruising; then, switch to heat to help relax sore spots and speed recovery. Shower every day.  Short baths are fine.  It feels good!  Keep the incisions and wounds clean with soap & water.   Try Gentle Massage and/or Stretching Massage at the area of pain many times a day Stop if you feel pain - do not overdo it Take over the counter pain medication This helps the muscle and nerve tissues become less irritable and calm down faster Choose ONE of the following over-the-counter anti-inflammatory medications: Acetaminophen 500mg  tabs (Tylenol) 1-2 pills with every meal  and just before bedtime (avoid if you have liver problems or if you have acetaminophen in you narcotic prescription) Naproxen 220mg  tabs (ex. Aleve, Naprosyn) 1-2 pills twice a day (avoid if you have kidney, stomach, IBD, or bleeding problems) Ibuprofen 200mg  tabs (ex. Advil, Motrin) 3-4 pills with every meal and just before bedtime (avoid if you have kidney, stomach, IBD, or bleeding problems) Take with food/snack several times a day as directed for at least 2 weeks to help keep pain / soreness down & more manageable. Take Narcotic prescription pain medication for more severe pain A prescription for strong pain control is often given to you upon discharge (for example: oxycodone/Percocet, hydrocodone/Norco/Vicodin, or tramadol/Ultram) Take your pain medication as prescribed. Be mindful that most narcotic prescriptions  contain Tylenol (acetaminophen) as well - avoid taking too much Tylenol. If you are having problems/concerns with the prescription medicine (does not control pain, nausea, vomiting, rash, itching, etc.), please call us [phone] to see if we need to switch you to a different pain medicine that will work better for you and/or control your side effects better. If you need a refill on your pain medication, you must call the office before 4 pm and on weekdays only.  By federal law, prescriptions for narcotics cannot be called into a pharmacy.  They must be filled out on paper & picked up from our office by the patient or authorized caretaker.  Prescriptions cannot be filled after 4 pm nor on weekends.    WHEN TO CALL us [phone] Severe uncontrolled or worsening pain  Fever over 101 F (38.5 C) Concerns with the incision: Worsening pain, redness, rash/hives, swelling, bleeding, or drainage Reactions / problems with new medications (itching, rash, hives, nausea, etc.) Nausea and/or vomiting Difficulty urinating Difficulty breathing Worsening fatigue, dizziness, lightheadedness, blurred vision Other concerns If you are not getting better after two weeks or are noticing you are getting worse, contact our office [phone] for further advice.  We may need to adjust your medications, re-evaluate you in the office, send you to the emergency room, or see what other things we can do to help. The clinic staff is available to answer your questions during regular business hours (8:30am-5pm).  Please don't hesitate to call and ask to speak to one of our nurses for clinical concerns.    A surgeon from [facility] is always on call at the hospitals 24 hours/day If you have a medical emergency, go to the nearest emergency room or call 911.  FOLLOW UP in our office One the day of your discharge from the hospital (or the next business weekday), please call [facility] to set up  or confirm an appointment to see your surgeon in the office for a follow-up appointment.  Usually it is 2-3 weeks after your surgery.   If you have skin staples at your incision(s), let the office know so we can set up a time in the office for the nurse to remove them (usually around 10 days after surgery). Make sure that you call for appointments the day of discharge (or the next business weekday) from the hospital to ensure a convenient appointment time. IF YOU HAVE DISABILITY OR FAMILY LEAVE FORMS, BRING THEM TO THE OFFICE FOR PROCESSING.  DO NOT GIVE THEM TO YOUR DOCTOR.  [facility] [address], [address], [place], [place]  [address] ? [phone] - Main [phone] - Toll Free,  [phone] - Fax [email or web address]  GETTING TO GOOD BOWEL HEALTH. It is expected for your digestive tract to need a few months to get back to normal.  It is common for your bowel movements and stools to be irregular.  You will have occasional bloating and cramping that should eventually fade away.  Until you are eating solid food normally, off all pain medications, and back to regular activities; your bowels will not be normal.   Avoiding constipation The goal: ONE SOFT BOWEL MOVEMENT A DAY!    Drink plenty of fluids.  Choose water first. TAKE A FIBER SUPPLEMENT EVERY DAY THE REST OF YOUR LIFE During your first week back home, gradually add back a fiber supplement every day Experiment which form you can tolerate.   There are many forms such as powders, tablets, wafers, gummies, etc Psyllium bran (Metamucil), methylcellulose (Citrucel), Miralax or Glycolax, Benefiber, Flax Seed.  Adjust the dose week-by-week (1/2 dose/day to 6 doses a day) until you are moving your bowels 1-2 times a day.  Cut back the dose or try a different fiber product if it is giving you problems such as diarrhea or bloating. Sometimes a laxative is needed to help jump-start bowels if  constipated until the fiber supplement can help regulate your bowels.  If you are tolerating eating & you are farting, it is okay to try a gentle laxative such as double dose MiraLax, prune juice, or Milk of Magnesia.  Avoid using laxatives too often. Stool softeners can sometimes help counteract the constipating effects of narcotic pain medicines.  It can also cause diarrhea, so avoid using for too long. If you are still constipated despite taking fiber daily, eating solids, and a few doses of laxatives, call our office. Controlling diarrhea Try drinking liquids and eating bland foods for a few days to avoid stressing your intestines further. Avoid dairy products (especially milk & ice cream) for a short time.  The intestines often can lose the ability to digest lactose when stressed. Avoid foods that cause gassiness or bloating.  Typical foods include beans and other legumes, cabbage, broccoli, and dairy foods.  Avoid greasy, spicy, fast foods.  Every person has some sensitivity to other foods, so listen to your body and avoid those foods that trigger problems for you. Probiotics (such as active yogurt, Align, etc) may help repopulate the intestines and colon with normal bacteria and calm down a sensitive digestive tract Adding a fiber supplement gradually can help thicken stools by absorbing excess fluid and retrain the intestines to act more normally.  Slowly increase the dose over a few weeks.  Too much fiber too soon can backfire and cause cramping & bloating. It is okay to try and slow down diarrhea with a few doses of antidiarrheal medicines.   Bismuth subsalicylate (ex. Kayopectate, Pepto Bismol) for a few doses can help control diarrhea.  Avoid if pregnant.   Loperamide (Imodium) can slow down diarrhea.  Start with one tablet (2mg ) first.  Avoid if you are having fevers or severe pain.  ILEOSTOMY PATIENTS WILL HAVE CHRONIC DIARRHEA since their colon is not in use.    Drink plenty of liquids.   You will need to drink even more glasses of water/liquid a day to avoid getting dehydrated. Record output from your ileostomy.  Expect to empty the bag every 3-4 hours at first.  Most people with a permanent ileostomy empty their bag 4-6 times at the least.   Use antidiarrheal medicine (especially Imodium) several times a day to avoid getting dehydrated.  Start with a dose at bedtime & breakfast.  Adjust up or down as needed.  Increase antidiarrheal medications as directed to avoid emptying the bag more than 8 times a day (every 3 hours). Work with your wound ostomy nurse to learn care for your ostomy.  See ostomy care instructions. TROUBLESHOOTING IRREGULAR BOWELS 1) Start with a soft & bland diet. No spicy, greasy, or fried foods.  2) Avoid gluten/wheat or dairy products from diet to see if symptoms improve. 3) Miralax 17gm or flax seed mixed in 8oz. water or juice-daily. May use 2-4 times a day as needed. 4) Gas-X, Phazyme, etc. as needed for gas & bloating.  5) Prilosec (omeprazole) over-the-counter as needed 6)  Consider probiotics (Align, Activa, etc) to help calm the bowels down  Call your doctor if you are getting worse or not getting better.  Sometimes further testing (cultures, endoscopy, X-ray studies, CT scans, bloodwork, etc.) may be needed to help diagnose and treat the cause of the diarrhea. [facility] [address], [address], [place], [place]  [address] [phone] - Main.    [phone]  - Toll Free.   [phone] - Fax [email or web address]

## 2023-08-30 NOTE — H&P (Signed)
 Urology Preoperative H&P   Chief Complaint: Diverticulitis  History of Present Illness: Sheila Lin is a 63 y.o. female with a history of diverticulitis and abscess here for resection with Dr. Michaell Cowing. Urology requested to place firefly in bilateral ureters. She denies prior urologic history. Denies fevers, chills, dysuria.  Past Medical History:  Diagnosis Date   Anemia    Arthritis    Blood dyscrasia    thrombocytosis   Cancer (HCC)    Mohs surgery on Face   Diverticulitis    Dysrhythmia    palps, remote hx, takes BB   GERD (gastroesophageal reflux disease)    History of bulimia    Hypertension    LBP (low back pain)    Palpitations 2011   [facility] card    Past Surgical History:  Procedure Laterality Date   ABDOMINAL HYSTERECTOMY     BIOPSY  05/30/2023   Procedure: BIOPSY;  Surgeon: Lemar Lofty., MD;  Location: [facility];  Service: Gastroenterology;;   COLONOSCOPY     COLONOSCOPY WITH PROPOFOL N/A 05/30/2023   Procedure: COLONOSCOPY WITH PROPOFOL;  Surgeon: Lemar Lofty., MD;  Location: [facility];  Service: Gastroenterology;  Laterality: N/A;   etopic pregnancy     LAPAROSCOPIC BILATERAL SALPINGO OOPHERECTOMY Right 10/06/2018   Procedure: LAPAROSCOPIC  RIGHT  SALPINGO OOPHORECTOMY;  Surgeon: Lavina Hamman, MD;  Location: [facility];  Service: Gynecology;  Laterality: Right;   POLYPECTOMY  05/30/2023   Procedure: POLYPECTOMY;  Surgeon: Mansouraty, [name]., MD;  Location: [facility];  Service: Gastroenterology;;    Allergies:  Allergies  Allergen Reactions   Fish Oil     GERD   Tramadol Itching    Family History  Problem Relation Age of Onset   Thyroid cancer Mother    Cancer Mother 61       sarcoma   Stomach cancer Mother    Colon cancer Neg Hx    Esophageal cancer Neg Hx    Inflammatory bowel disease Neg Hx    Liver disease Neg Hx    Pancreatic cancer Neg Hx    Rectal cancer Neg Hx     Social History:   reports that she has never smoked. She has never used smokeless tobacco. She reports that she does not drink alcohol and does not use drugs.  ROS: A complete review of systems was performed.  All systems are negative except for pertinent findings as noted.  Physical Exam:  Vital signs in last 24 hours: Temp:  [98.1 F (36.7 C)] 98.1 F (36.7 C) (03/07 0856) Pulse Rate:  [66] 66 (03/07 0856) Resp:  [16] 16 (03/07 0856) BP: (120)/(71) 120/71 (03/07 0856) SpO2:  [99 %] 99 % (03/07 0856) Weight:  [72.6 kg] 72.6 kg (03/07 0913) Constitutional:  Alert and oriented, No acute distress Cardiovascular: Regular rate and rhythm Respiratory: Normal respiratory effort, Lungs clear bilaterally GI: Abdomen is soft, nontender, nondistended, no abdominal masses GU: No CVA tenderness Lymphatic: No lymphadenopathy Neurologic: Grossly intact, no focal deficits Psychiatric: Normal mood and affect  Laboratory Data:  No results for input(s): "WBC", "HGB", "HCT", "PLT" in the last 72 hours.  No results for input(s): "NA", "K", "CL", "GLUCOSE", "BUN", "CALCIUM", "CREATININE" in the last 72 hours.  Invalid input(s): "CO3"   No results found for this or any previous visit (from the past 24 hours). No results found for this or any previous visit (from the past 240 hours).  Renal Function: No results for input(s): "CREATININE" in the last  168 hours. Estimated Creatinine Clearance: 71.7 mL/min (by C-G formula based on SCr of 0.67 mg/dL).  Radiologic Imaging: No results found.  I independently reviewed the above imaging studies.  Assessment and Plan Sheila Lin is a 63 y.o. female with diverticulitis here for resection and firefly instillation into bilateral ureters.  -The risks, benefits and alternatives of cystoscopy with firefly instillation was discussed with the patient.  Risks include, but are not limited to: bleeding, urinary tract infection, ureteral injury, ureteral stricture disease,  chronic pain, urinary symptoms, bladder injury, stent migration, the need for nephrostomy tube placement, MI, CVA, DVT, PE and the inherent risks with general anesthesia.  The patient voices understanding and wishes to proceed.       Matt R. Kalana Yust MD 08/30/2023, 9:40 AM  [facility] Pager: [phone]

## 2023-08-30 NOTE — Op Note (Signed)
 Operative Note  Preoperative diagnosis:  1.  Diverticulitis  Postoperative diagnosis: 1.  Diverticulitis  Procedure(s): 1.  Cystoscopy 2. Firefly instillation in bilateral ureters  Surgeon: Jettie Pagan, MD  Assistants:  None  Anesthesia:  General  Complications:  None  EBL:  None for my portion of the case  Specimens: 1. None for my portion of the case  Drains/Catheters: 1.  16 French foley catheter.  Intraoperative findings:   Bladder with a small 1cm area of inflammation on the posterior wall likely consistent with known diverticulitis with abscess. No concern for malignancy. No obvious large fistula.  Indication:  Sheila Lin is a 64 year old female with a history of diverticulitis here for cystoscopy and firefly placement.  Description of procedure: The patient was identified and surgical site verification was performed prior to obtaining consent.  The patient was brought to the operating suite.  Under adequate general anesthesia the patient was positioned in dorsal lithotomy and prepped and draped.  A preoperative Time Out was performed addressing the anticipated surgical site, procedure, and safety precautions.  The 21Fr cystoscope was inserted into the patient's urethral meatus and advanced to the bladder.   The bladder was inspected with the 30 degree lens.  The ureteral orifices were in their normal anatomic positions.  The bladder showed no trabeculation.  There were no bladder tumors noted. As above, there was a small area of inflammation consistent with known diverticulitis on posterior wall of the bladder. Although no large fistula was identified, there is possible she has a small fistulous connection given diverticulitis history.  The right orifice was intubated with a sensor wire and a 5 [place] open ended catheter was placed over the wire into the right ureter and advanced to 25 cm. I then slowly pulled back and injected 7.60ml of the firefly contrast.   Subsequently turned my attention to the patient's left ureteral orifice and performed a similar task, injecting 7.11ml of the firefly solution. in a retrograde fashion in the left ureter.   I then  placed a 16 [place] Foley.  The case was then turned over to Dr. Michaell Cowing' team for the remainder of the procedure.  Matt R. Ladeana Laplant MD [facility]  Pager: [phone]

## 2023-08-31 LAB — CBC
HCT: 33.7 % — ABNORMAL LOW (ref 36.0–46.0)
Hemoglobin: 10.4 g/dL — ABNORMAL LOW (ref 12.0–15.0)
MCH: 28.3 pg (ref 26.0–34.0)
MCHC: 30.9 g/dL (ref 30.0–36.0)
MCV: 91.6 fL (ref 80.0–100.0)
Platelets: 409 10*3/uL — ABNORMAL HIGH (ref 150–400)
RBC: 3.68 MIL/uL — ABNORMAL LOW (ref 3.87–5.11)
RDW: 14.9 % (ref 11.5–15.5)
WBC: 23.2 10*3/uL — ABNORMAL HIGH (ref 4.0–10.5)
nRBC: 0 % (ref 0.0–0.2)

## 2023-08-31 LAB — BASIC METABOLIC PANEL
Anion gap: 10 (ref 5–15)
BUN: 29 mg/dL — ABNORMAL HIGH (ref 8–23)
CO2: 26 mmol/L (ref 22–32)
Calcium: 9.7 mg/dL (ref 8.9–10.3)
Chloride: 101 mmol/L (ref 98–111)
Creatinine, Ser: 0.99 mg/dL (ref 0.44–1.00)
GFR, Estimated: >60 mL/min (ref >60)
Glucose, Bld: 129 mg/dL — ABNORMAL HIGH (ref 70–99)
Potassium: 3.9 mmol/L (ref 3.5–5.1)
Sodium: 137 mmol/L (ref 135–145)

## 2023-08-31 LAB — MAGNESIUM: Magnesium: 1.9 mg/dL (ref 1.7–2.4)

## 2023-08-31 NOTE — Progress Notes (Signed)
 Pharmacy Brief Note - Alvimopan (Entereg)  The standing order set for alvimopan (Entereg) now includes an automatic order to discontinue the drug after the patient has had a bowel movement. The change was approved by the Pharmacy & Therapeutics Committee and the Medical Executive Committee.   This patient has had bowel movements documented by nursing. Therefore, alvimopan has been discontinued. If there are questions, please contact the pharmacy at [phone].   Thank you-  Dorna Leitz, PharmD, BCPS 08/31/2023 11:13 AM

## 2023-08-31 NOTE — Progress Notes (Signed)
 1 Day Post-Op   Subjective/Chief Complaint: Complains of soreness   Objective: Vital signs in last 24 hours: Temp:  [97.6 F (36.4 C)-98.6 F (37 C)] 98.2 F (36.8 C) (03/08 1610) Pulse Rate:  [62-75] 72 (03/08 0614) Resp:  [11-18] 18 (03/08 0614) BP: (97-133)/(58-72) 111/58 (03/08 0614) SpO2:  [95 %-100 %] 99 % (03/08 0614) Weight:  [77.2 kg] 77.2 kg (03/08 0615) Last BM Date : 08/30/23  Intake/Output from previous day: 03/07 0701 - 03/08 0700 In: 2016.1 [P.O.:840; I.V.:1076.1; IV Piggyback:100] Out: 2101 [Urine:2000; Stool:1; Blood:100] Intake/Output this shift: Total I/O In: 480 [P.O.:480] Out: -   General appearance: alert and cooperative Resp: clear to auscultation bilaterally Cardio: regular rate and rhythm GI: soft, mild tenderness. Incisions look good  Lab Results:  Recent Labs    08/31/23 0539  WBC 23.2*  HGB 10.4*  HCT 33.7*  PLT 409*   BMET Recent Labs    08/31/23 0539  NA 137  K 3.9  CL 101  CO2 26  GLUCOSE 129*  BUN 29*  CREATININE 0.99  CALCIUM 9.7   PT/INR No results for input(s): "LABPROT", "INR" in the last 72 hours. ABG No results for input(s): "PHART", "HCO3" in the last 72 hours.  Invalid input(s): "PCO2", "PO2"  Studies/Results: No results found.  Anti-infectives: Anti-infectives (From admission, onward)    Start     Dose/Rate Route Frequency Ordered Stop   08/30/23 2200  cefoTEtan (CEFOTAN) 2 g in sodium chloride 0.9 % 100 mL IVPB        2 g 200 mL/hr over 30 Minutes Intravenous Every 12 hours 08/30/23 1518 08/30/23 2318   08/30/23 1400  neomycin (MYCIFRADIN) tablet 1,000 mg  Status:  Discontinued       Placed in "And" Linked Group   1,000 mg Oral 3 times per day 08/30/23 0837 08/30/23 0840   08/30/23 1400  metroNIDAZOLE (FLAGYL) tablet 1,000 mg  Status:  Discontinued       Placed in "And" Linked Group   1,000 mg Oral 3 times per day 08/30/23 0837 08/30/23 0840   08/30/23 0845  cefoTEtan (CEFOTAN) 2 g in sodium  chloride 0.9 % 100 mL IVPB        2 g 200 mL/hr over 30 Minutes Intravenous On call to O.R. 08/30/23 0837 08/30/23 1149       Assessment/Plan: s/p Procedure(s) with comments: ROBOTIC RESECTION OF COLON RECTOSIGMOID (N/A) - 3 1/2  HOURS TOTAL INCLUDING [facility] TIME LOWER ANTERIOR RESECTION, DRAINAGE OF PELVIC ABSCESS (N/A) SIGMOIDOSCOPY, FLEXIBLE, TAP BLOCK, FIREFLY INJECTION (N/A) CYSTOSCOPY with FIREFLY INJECTION (N/A) Advance diet as tolerated Ambulate POD 1 Lovenox for vte prophylaxis  LOS: 1 day    Sheila Lin 08/31/2023

## 2023-08-31 NOTE — Plan of Care (Signed)

## 2023-09-01 NOTE — Progress Notes (Signed)
 Mobility Specialist - Progress Note   09/01/23 0845  Mobility  Activity Ambulated independently in hallway  Level of Assistance Independent  Assistive Device None  Distance Ambulated (ft) 500 ft  Activity Response Tolerated well  Mobility Referral Yes  Mobility visit 1 Mobility  Mobility Specialist Start Time (ACUTE ONLY) 0836  Mobility Specialist Stop Time (ACUTE ONLY) 0845  Mobility Specialist Time Calculation (min) (ACUTE ONLY) 9 min   Pt received in bed and agreeable to mobility. C/o nausea during session. Pt to bed after session with all needs met.    [facility]

## 2023-09-01 NOTE — Plan of Care (Signed)

## 2023-09-01 NOTE — Progress Notes (Signed)
 2 Days Post-Op   Subjective/Chief Complaint: Doesn't feel as well today. nauseated   Objective: Vital signs in last 24 hours: Temp:  [98.2 F (36.8 C)-98.4 F (36.9 C)] 98.4 F (36.9 C) (03/09 0503) Pulse Rate:  [72-80] 80 (03/09 0503) Resp:  [16-20] 18 (03/09 0503) BP: (105-120)/(56-74) 120/74 (03/09 0503) SpO2:  [98 %-100 %] 100 % (03/09 0503) Last BM Date : 08/30/23  Intake/Output from previous day: 03/08 0701 - 03/09 0700 In: 1320 [P.O.:1320] Out: 900 [Urine:900] Intake/Output this shift: No intake/output data recorded.  General appearance: alert and cooperative Resp: clear to auscultation bilaterally Cardio: regular rate and rhythm GI: soft, minimal tenderness. Incisions look good  Lab Results:  Recent Labs    08/31/23 0539  WBC 23.2*  HGB 10.4*  HCT 33.7*  PLT 409*   BMET Recent Labs    08/31/23 0539  NA 137  K 3.9  CL 101  CO2 26  GLUCOSE 129*  BUN 29*  CREATININE 0.99  CALCIUM 9.7   PT/INR No results for input(s): "LABPROT", "INR" in the last 72 hours. ABG No results for input(s): "PHART", "HCO3" in the last 72 hours.  Invalid input(s): "PCO2", "PO2"  Studies/Results: No results found.  Anti-infectives: Anti-infectives (From admission, onward)    Start     Dose/Rate Route Frequency Ordered Stop   08/30/23 2200  cefoTEtan (CEFOTAN) 2 g in sodium chloride 0.9 % 100 mL IVPB        2 g 200 mL/hr over 30 Minutes Intravenous Every 12 hours 08/30/23 1518 08/30/23 2318   08/30/23 1400  neomycin (MYCIFRADIN) tablet 1,000 mg  Status:  Discontinued       Placed in "And" Linked Group   1,000 mg Oral 3 times per day 08/30/23 0837 08/30/23 0840   08/30/23 1400  metroNIDAZOLE (FLAGYL) tablet 1,000 mg  Status:  Discontinued       Placed in "And" Linked Group   1,000 mg Oral 3 times per day 08/30/23 0837 08/30/23 0840   08/30/23 0845  cefoTEtan (CEFOTAN) 2 g in sodium chloride 0.9 % 100 mL IVPB        2 g 200 mL/hr over 30 Minutes Intravenous On  call to O.R. 08/30/23 0837 08/30/23 1149       Assessment/Plan: s/p Procedure(s) with comments: ROBOTIC RESECTION OF COLON RECTOSIGMOID (N/A) - 3 1/2  HOURS TOTAL INCLUDING [facility] TIME LOWER ANTERIOR RESECTION, DRAINAGE OF PELVIC ABSCESS (N/A) SIGMOIDOSCOPY, FLEXIBLE, TAP BLOCK, FIREFLY INJECTION (N/A) CYSTOSCOPY with FIREFLY INJECTION (N/A) Advance diet as tolerated POD 2 Ambulate lovenox  LOS: 2 days    Sheila Lin 09/01/2023

## 2023-09-01 NOTE — Plan of Care (Signed)
  Problem: Education: Goal: Understanding of discharge needs will improve Outcome: Progressing Goal: Verbalization of understanding of the causes of altered bowel function will improve Outcome: Progressing

## 2023-09-02 ENCOUNTER — Encounter (HOSPITAL_COMMUNITY): Payer: Self-pay | Admitting: Surgery

## 2023-09-02 LAB — SURGICAL PATHOLOGY

## 2023-09-02 MED ORDER — HYDROCODONE-ACETAMINOPHEN 5-325 MG PO TABS: 1.0000 | ORAL_TABLET | ORAL | 0 refills | Status: AC | PRN

## 2023-09-02 MED ORDER — LACTATED RINGERS IV BOLUS: 1000.0000 mL | Freq: Three times a day (TID) | INTRAVENOUS | Status: DC | PRN

## 2023-09-02 NOTE — Discharge Summary (Signed)
 Physician Discharge Summary    Sheila Lin MRN: [id number] DOB/AGE: [birth year] = 63 y.o.  Patient Care Team: [name], [name], MD as PCP - General Meisinger, [name], MD as Consulting Physician (Obstetrics and Gynecology) Mansouraty, [name]., MD as Consulting Physician (Gastroenterology) [name], MD as Consulting Physician (Colon and Rectal Surgery)  Admit date: 08/30/2023  Discharge date: 09/02/2023  Hospital Stay = 3 days    Discharge Diagnoses:  Principal Problem:   Diverticulitis of colon Active Problems:   Sigmoid diverticulitis   3 Days Post-Op  08/30/2023  POST-OPERATIVE DIAGNOSIS:  DIVERTICULITIS WITH ABSCESS   PROCEDURE:   -ROBOTIC LOW ANTERIOR RECTOSIGMOID RESECTION WITH ANASTOMOSIS -ROBOTIC DRAINAGE OF PELVIC ABSCESS -INTRAOPERATIVE ASSESSMENT OF TISSUE VASCULAR PERFUSION USING ICG (indocyanine green) IMMUNOFLUORESCENCE -TRANSVERSUS ABDOMINIS PLANE (TAP) BLOCK - BILATERAL   SURGEON:  [name], MD  OR FINDINGS:  Patient had concrete phlegmon of rectosigmoid colon going on to proximal rectum densely adherent to vaginal cuff and dome of bladder.  No evidence of colovesical or colovesical fistula.  Left lateral chronic abscess cavity - aspirated and abscess wall debrided.  No obvious metastatic disease on visceral parietal peritoneum or liver.   It is a 31mm EEA anastomosis ( distal descending colon  connected to proximal rectum.)  It rests 13 cm from the anal verge by flexible sigmoidoscopy   CASE DATA: Type of patient?: Elective [facility] Private Case Status of Case? Elective Scheduled Infection Present At Time Of Surgery (PATOS)?  ABSCESS & PHLEGMON  Consults: Anesthesia  Hospital Course:   The patient underwent the surgery above.  Postoperatively, the patient gradually mobilized and advanced to a solid diet.  Pain and other symptoms were treated aggressively.    By the time of discharge, the patient was walking well the hallways, eating  food, having flatus.  Pain was well-controlled on an oral medications.  Based on meeting discharge criteria and continuing to recover, I felt it was safe for the patient to be discharged from the hospital to further recover with close followup. Postoperative recommendations were discussed in detail.  They are written as well.  Discharged Condition: good  Discharge Exam: Blood pressure 126/62, pulse 72, temperature 97.6 F (36.4 C), temperature source Oral, resp. rate 18, height 5\' 8"  (1.727 m), weight 77.2 kg, SpO2 99%.  General: Pt awake/alert/oriented x4 in No acute distress Eyes: PERRL, normal EOM.  Sclera clear.  No icterus Neuro: CN II-XII intact w/o focal sensory/motor deficits. Lymph: No head/neck/groin lymphadenopathy Psych:  No delerium/psychosis/paranoia HENT: Normocephalic, Mucus membranes moist.  No thrush Neck: Supple, No tracheal deviation Chest: No chest wall pain w good excursion CV:  Pulses intact.  Regular rhythm MS: Normal AROM mjr joints.  No obvious deformity Abdomen: Soft.  Nondistended.  Mildly tender at incisions only.  No evidence of peritonitis.  No incarcerated hernias. Ext:  SCDs BLE.  No mjr edema.  No cyanosis Skin: No petechiae / purpura   Disposition:    Follow-up Information     [name], MD Follow up on 09/17/2023.   Specialties: General Surgery, Colon and Rectal Surgery Why: To follow up after your operation Contact information: [address] [place] [address] [phone]                 Discharge disposition: 01-Home or Self Care         Allergies as of 09/02/2023       Reactions   Fish Oil    GERD   Tramadol  Itching        Medication List     STOP taking these medications    amoxicillin-clavulanate 875-125 MG tablet Commonly known as: AUGMENTIN       TAKE these medications    ascorbic acid 500 MG tablet Commonly known as: VITAMIN C Take 500 mg by mouth daily.   aspirin EC 81 MG  tablet Take 1 tablet (81 mg total) by mouth daily.   atenolol 50 MG tablet Commonly known as: TENORMIN Take 1 tablet (50 mg total) by mouth daily.   cholecalciferol 25 MCG (1000 UNIT) tablet Commonly known as: VITAMIN D3 Take 1,000 Units by mouth daily.   ferrous sulfate 324 MG Tbec Take 324 mg by mouth daily.   HYDROcodone-acetaminophen 5-325 MG tablet Commonly known as: NORCO/VICODIN Take 1-2 tablets by mouth every 4 (four) hours as needed for moderate pain (pain score 4-6).   MULTIPLE VITAMIN PO Take 1 tablet by mouth daily.   [facility] Colon Health Caps Take 1 capsule by mouth daily.   polyethylene glycol 17 g packet Commonly known as: MIRALAX / GLYCOLAX Take 17 g by mouth 2 (two) times daily.        Significant Diagnostic Studies:  Results for orders placed or performed during the hospital encounter of 08/30/23 (from the past 72 hours)  ABO/Rh     Status: None   Collection Time: 08/30/23  8:55 AM  Result Value Ref Range   ABO/RH(D)      A POS Performed at [facility], [address], [place], [place] [address]   Glucose, capillary     Status: Abnormal   Collection Time: 08/30/23  9:36 PM  Result Value Ref Range   Glucose-Capillary 163 (H) 70 - 99 mg/dL    Comment: Glucose reference range applies only to samples taken after fasting for at least 8 hours.  Basic metabolic panel     Status: Abnormal   Collection Time: 08/31/23  5:39 AM  Result Value Ref Range   Sodium 137 135 - 145 mmol/L   Potassium 3.9 3.5 - 5.1 mmol/L   Chloride 101 98 - 111 mmol/L   CO2 26 22 - 32 mmol/L   Glucose, Bld 129 (H) 70 - 99 mg/dL    Comment: Glucose reference range applies only to samples taken after fasting for at least 8 hours.   BUN 29 (H) 8 - 23 mg/dL   Creatinine, Ser 4.78 0.44 - 1.00 mg/dL   Calcium 9.7 8.9 - 29.5 mg/dL   GFR, Estimated >62 >13 mL/min    Comment: (NOTE) Calculated using the CKD-EPI Creatinine Equation (2021)    Anion gap 10 5 - 15     Comment: Performed at [facility], [address], [place], [place] [address]  CBC     Status: Abnormal   Collection Time: 08/31/23  5:39 AM  Result Value Ref Range   WBC 23.2 (H) 4.0 - 10.5 K/uL   RBC 3.68 (L) 3.87 - 5.11 MIL/uL   Hemoglobin 10.4 (L) 12.0 - 15.0 g/dL   HCT 84.6 (L) 96.2 - 95.2 %   MCV 91.6 80.0 - 100.0 fL   MCH 28.3 26.0 - 34.0 pg   MCHC 30.9 30.0 - 36.0 g/dL   RDW 84.1 32.4 - 40.1 %   Platelets 409 (H) 150 - 400 K/uL   nRBC 0.0 0.0 - 0.2 %    Comment: Performed at [facility], [address], [place], [place] [address]  Magnesium     Status: None   Collection Time: 08/31/23  5:39 AM  Result Value Ref Range   Magnesium 1.9 1.7 - 2.4 mg/dL    Comment: Performed at [facility], [address] [place], [place] [address]    No results found.  Past Medical History:  Diagnosis Date   Anemia    Arthritis    Blood dyscrasia    thrombocytosis   Cancer (HCC)    Mohs surgery on Face   Diverticulitis    Dysrhythmia    palps, remote hx, takes BB   GERD (gastroesophageal reflux disease)    History of bulimia    Hypertension    LBP (low back pain)    Palpitations 2011   [facility] card    Past Surgical History:  Procedure Laterality Date   ABDOMINAL HYSTERECTOMY     BIOPSY  05/30/2023   Procedure: BIOPSY;  Surgeon: Lemar Lofty., MD;  Location: [facility];  Service: Gastroenterology;;   COLONOSCOPY     COLONOSCOPY WITH PROPOFOL N/A 05/30/2023   Procedure: COLONOSCOPY WITH PROPOFOL;  Surgeon: Lemar Lofty., MD;  Location: [facility];  Service: Gastroenterology;  Laterality: N/A;   etopic pregnancy     LAPAROSCOPIC BILATERAL SALPINGO OOPHERECTOMY Right 10/06/2018   Procedure: LAPAROSCOPIC  RIGHT  SALPINGO OOPHORECTOMY;  Surgeon: Lavina Hamman, MD;  Location: [facility];  Service: Gynecology;  Laterality: Right;   POLYPECTOMY  05/30/2023   Procedure: POLYPECTOMY;   Surgeon: Mansouraty, [name]., MD;  Location: [facility];  Service: Gastroenterology;;    Social History   Socioeconomic History   Marital status: Married    Spouse name: Not on file   Number of children: 2   Years of education: Not on file   Highest education level: Not on file  Occupational History   Occupation: retired  Tobacco Use   Smoking status: Never   Smokeless tobacco: Never  Vaping Use   Vaping status: Never Used  Substance and Sexual Activity   Alcohol use: No   Drug use: No   Sexual activity: Yes  Other Topics Concern   Not on file  Social History Narrative   Regular exercise- playing ball   Social Drivers of Health   Financial Resource Strain: Not on file  Food Insecurity: No Food Insecurity (08/30/2023)   Hunger Vital Sign    Worried About Running Out of Food in the Last Year: Never true    Ran Out of Food in the Last Year: Never true  Transportation Needs: No Transportation Needs (08/30/2023)   PRAPARE - Administrator, Civil Service (Medical): No    Lack of Transportation (Non-Medical): No  Physical Activity: Not on file  Stress: Not on file  Social Connections: Moderately Isolated (07/20/2023)   Social Connection and Isolation Panel [NHANES]    Frequency of Communication with Friends and Family: More than three times a week    Frequency of Social Gatherings with Friends and Family: Once a week    Attends Religious Services: Never    Database administrator or Organizations: No    Attends Banker Meetings: Never    Marital Status: Married  Catering manager Violence: Not At Risk (08/30/2023)   Humiliation, Afraid, Rape, and Kick questionnaire    Fear of Current or Ex-Partner: No    Emotionally Abused: No    Physically Abused: No    Sexually Abused: No    Family History  Problem Relation Age  of Onset   Thyroid cancer Mother    Cancer Mother 49       sarcoma   Stomach cancer Mother    Colon cancer Neg Hx    Esophageal  cancer Neg Hx    Inflammatory bowel disease Neg Hx    Liver disease Neg Hx    Pancreatic cancer Neg Hx    Rectal cancer Neg Hx     Current Facility-Administered Medications  Medication Dose Route Frequency Provider Last Rate Last Admin   0.9 %  sodium chloride infusion  250 mL Intravenous PRN [name], MD       acetaminophen (TYLENOL) tablet 500 mg  500 mg Oral Trecia Rogers, MD   500 mg at 09/02/23 0615   alum & mag hydroxide-simeth (MAALOX/MYLANTA) 200-200-20 MG/5ML suspension 30 mL  30 mL Oral Q6H PRN [name], MD       ascorbic acid (VITAMIN C) tablet 500 mg  500 mg Oral Daily [name], MD   500 mg at 09/01/23 0947   aspirin EC tablet 81 mg  81 mg Oral Daily [name], MD   81 mg at 09/01/23 0948   atenolol (TENORMIN) tablet 50 mg  50 mg Oral Daily [name], MD   50 mg at 09/01/23 0948   cholecalciferol (VITAMIN D3) 25 MCG (1000 UNIT) tablet 1,000 Units  1,000 Units Oral Daily [name], MD   1,000 Units at 09/01/23 0948   diphenhydrAMINE (BENADRYL) 12.5 MG/5ML elixir 12.5 mg  12.5 mg Oral Q6H PRN [name], MD       Or   diphenhydrAMINE (BENADRYL) injection 12.5 mg  12.5 mg Intravenous Q6H PRN [name], MD       enoxaparin (LOVENOX) injection 40 mg  40 mg Subcutaneous Q24H [name], MD   40 mg at 09/02/23 0981   feeding supplement (ENSURE SURGERY) liquid 237 mL  237 mL Oral BID BM [name], MD   237 mL at 09/01/23 1410   gabapentin (NEURONTIN) capsule 200 mg  200 mg Oral Laurena Slimmer, MD   200 mg at 09/01/23 2126   hydrALAZINE (APRESOLINE) injection 10 mg  10 mg Intravenous Q2H PRN [name], MD       HYDROcodone-acetaminophen (NORCO/VICODIN) 5-325 MG per tablet 1-2 tablet  1-2 tablet Oral Q4H PRN [name], MD   2 tablet at 09/02/23 0720   HYDROmorphone (DILAUDID) injection 0.5-2 mg  0.5-2 mg Intravenous Q4H PRN [name], MD   1 mg at 08/31/23 0404   lactated ringers bolus 1,000 mL  1,000 mL Intravenous Q8H PRN [name], MD       magic mouthwash  15 mL Oral QID PRN [name], MD       melatonin tablet 3 mg  3 mg Oral QHS PRN [name], MD       menthol-cetylpyridinium (CEPACOL) lozenge 3 mg  1 lozenge Oral PRN [name], MD       methocarbamol (ROBAXIN) injection 1,000 mg  1,000 mg Intravenous Q6H PRN [name], MD       methocarbamol (ROBAXIN) tablet 1,000 mg  1,000 mg Oral Q6H PRN [name], MD   1,000 mg at 08/31/23 0953   metoprolol tartrate (LOPRESSOR) injection 5 mg  5 mg Intravenous Q6H PRN [name], MD       multivitamins with iron tablet 1 tablet  1 tablet Oral Daily [name], MD   1 tablet at 09/01/23 0948   naphazoline-glycerin (CLEAR EYES REDNESS)  ophth solution 1-2 drop  1-2 drop Both Eyes QID PRN [name], MD       ondansetron [facility]) tablet 4 mg  4 mg Oral Q6H PRN [name], MD   4 mg at 09/01/23 0442   Or   ondansetron (ZOFRAN) injection 4 mg  4 mg Intravenous Q6H PRN [name], MD       phenol (CHLORASEPTIC) mouth spray 2 spray  2 spray Mouth/Throat PRN [name], MD       polycarbophil (FIBERCON) tablet 625 mg  625 mg Oral BID [name], MD   625 mg at 09/01/23 2126   prochlorperazine (COMPAZINE) tablet 10 mg  10 mg Oral Q6H PRN [name], MD       Or   prochlorperazine (COMPAZINE) injection 5-10 mg  5-10 mg Intravenous Q6H PRN [name], MD   10 mg at 09/01/23 0981   simethicone (MYLICON) chewable tablet 40 mg  40 mg Oral Q6H PRN [name], MD       sodium chloride (OCEAN) 0.65 % nasal spray 1-2 spray  1-2 spray Each Nare Q6H PRN [name], MD       sodium chloride flush (NS) 0.9 % injection 3 mL  3 mL Intravenous Catha Gosselin, MD   3 mL at 09/01/23 2130   sodium chloride flush (NS) 0.9 % injection 3 mL  3 mL Intravenous PRN [name], MD         Allergies  Allergen Reactions   Fish Oil     GERD   Tramadol Itching    Signed:   [name], MD, FACS, MASCRS Esophageal, Gastrointestinal &  Colorectal Surgery Robotic and Minimally Invasive Surgery  [facility] [address], [address] [place], [place] [address] [phone] Fax [phone] Main  CONTACT INFORMATION: Weekday (9AM-5PM): Call [facility] main office at [phone] Weeknight (5PM-9AM) or Weekend/Holiday: Check EPIC "Web Links" tab & use "AMION" (password " [email or web address]") for General Surgery [facility] coverage  Please, DO NOT use SecureChat  (it is not reliable communication to reach operating surgeons & will lead to a delay in care).   Epic staff messaging available for outptient concerns needing 1-2 business day response.      09/02/2023, 8:20 AM

## 2023-09-02 NOTE — Progress Notes (Signed)
Pt was discharged home today. Instructions were reviewed with patient, and questions were answered. Pt was taken to main entrance via wheelchair by NT.  

## 2023-09-02 NOTE — Plan of Care (Signed)

## 2023-09-02 NOTE — TOC Transition Note (Signed)
 Transition of Care [facility]) - Discharge Note   Patient Details  Name: Sheila Lin MRN: [id number] Date of Birth: Dec 17, 1960  [facility] CM/SW Contact:  Howell Rucks, RN Phone Number: 09/02/2023, 9:20 AM   Clinical Narrative: patient discharged prior to [facility] initial assessment.             Patient Goals and CMS Choice            Discharge Placement                       Discharge Plan and Services Additional resources added to the After Visit Summary for                                       Social Drivers of Health (SDOH) Interventions SDOH Screenings   Food Insecurity: No Food Insecurity (08/30/2023)  Housing: Low Risk  (08/30/2023)  Transportation Needs: No Transportation Needs (08/30/2023)  Utilities: Not At Risk (08/30/2023)  Depression (PHQ2-9): Low Risk  (03/27/2023)  Social Connections: Moderately Isolated (07/20/2023)  Tobacco Use: Low Risk  (08/30/2023)     Readmission Risk Interventions     No data to display

## 2023-09-02 NOTE — Anesthesia Postprocedure Evaluation (Signed)
 Anesthesia Post Note  Patient: Chaya Jan  Procedure(s) Performed: ROBOTIC RESECTION OF COLON RECTOSIGMOID LOWER ANTERIOR RESECTION, DRAINAGE OF PELVIC ABSCESS SIGMOIDOSCOPY, FLEXIBLE, TAP BLOCK, FIREFLY INJECTION CYSTOSCOPY with FIREFLY INJECTION     Patient location during evaluation: PACU Anesthesia Type: General Level of consciousness: awake and alert Pain management: pain level controlled Vital Signs Assessment: post-procedure vital signs reviewed and stable Respiratory status: spontaneous breathing, nonlabored ventilation and respiratory function stable Cardiovascular status: stable and blood pressure returned to baseline Anesthetic complications: no   No notable events documented.  Last Vitals:  Vitals:   09/01/23 2110 09/02/23 0517  BP: 128/65 126/62  Pulse: 68 72  Resp: 20 18  Temp: 36.9 C 36.4 C  SpO2: 98% 99%    Last Pain:  Vitals:   09/02/23 0720  TempSrc:   PainSc: 6                  Beryle Lathe

## 2023-09-02 NOTE — Plan of Care (Signed)
  Problem: Education: Goal: Understanding of discharge needs will improve Outcome: Adequate for Discharge Goal: Verbalization of understanding of the causes of altered bowel function will improve Outcome: Adequate for Discharge   Problem: Activity: Goal: Ability to tolerate increased activity will improve Outcome: Adequate for Discharge   Problem: Bowel/Gastric: Goal: Gastrointestinal status for postoperative course will improve Outcome: Adequate for Discharge   Problem: Health Behavior/Discharge Planning: Goal: Identification of community resources to assist with postoperative recovery needs will improve Outcome: Adequate for Discharge   Problem: Nutritional: Goal: Will attain and maintain optimal nutritional status will improve Outcome: Adequate for Discharge   Problem: Clinical Measurements: Goal: Postoperative complications will be avoided or minimized Outcome: Adequate for Discharge

## 2023-10-24 ENCOUNTER — Other Ambulatory Visit: Payer: Self-pay | Admitting: Internal Medicine

## 2024-01-02 ENCOUNTER — Other Ambulatory Visit (HOSPITAL_COMMUNITY): Payer: Self-pay

## 2024-07-09 ENCOUNTER — Other Ambulatory Visit (HOSPITAL_COMMUNITY): Payer: Self-pay

## 2024-07-09 ENCOUNTER — Other Ambulatory Visit (HOSPITAL_BASED_OUTPATIENT_CLINIC_OR_DEPARTMENT_OTHER): Payer: Self-pay

## 2024-07-23 ENCOUNTER — Encounter: Payer: Self-pay | Admitting: Internal Medicine

## 2024-07-23 ENCOUNTER — Ambulatory Visit: Admitting: Internal Medicine

## 2024-07-23 VITALS — BP 118/70 | HR 61 | Temp 98.3°F | Ht 68.0 in | Wt 173.0 lb

## 2024-07-23 DIAGNOSIS — K5732 Diverticulitis of large intestine without perforation or abscess without bleeding: Secondary | ICD-10-CM

## 2024-07-23 DIAGNOSIS — E559 Vitamin D deficiency, unspecified: Secondary | ICD-10-CM

## 2024-07-23 DIAGNOSIS — Z0000 Encounter for general adult medical examination without abnormal findings: Secondary | ICD-10-CM

## 2024-07-23 LAB — COMPREHENSIVE METABOLIC PANEL WITH GFR
ALT: 14 U/L (ref 3–35)
AST: 14 U/L (ref 5–37)
Albumin: 4.3 g/dL (ref 3.5–5.2)
Alkaline Phosphatase: 72 U/L (ref 39–117)
BUN: 18 mg/dL (ref 6–23)
CO2: 28 meq/L (ref 19–32)
Calcium: 9.9 mg/dL (ref 8.4–10.5)
Chloride: 106 meq/L (ref 96–112)
Creatinine, Ser: 0.67 mg/dL (ref 0.40–1.20)
GFR: 91.96 mL/min
Glucose, Bld: 86 mg/dL (ref 70–99)
Potassium: 3.9 meq/L (ref 3.5–5.1)
Sodium: 139 meq/L (ref 135–145)
Total Bilirubin: 0.5 mg/dL (ref 0.2–1.2)
Total Protein: 6.9 g/dL (ref 6.0–8.3)

## 2024-07-23 LAB — CBC WITH DIFFERENTIAL/PLATELET
Basophils Absolute: 0.1 10*3/uL (ref 0.0–0.1)
Basophils Relative: 1.4 % (ref 0.0–3.0)
Eosinophils Absolute: 0.1 10*3/uL (ref 0.0–0.7)
Eosinophils Relative: 1.6 % (ref 0.0–5.0)
HCT: 34.2 % — ABNORMAL LOW (ref 36.0–46.0)
Hemoglobin: 11.6 g/dL — ABNORMAL LOW (ref 12.0–15.0)
Lymphocytes Relative: 38.8 % (ref 12.0–46.0)
Lymphs Abs: 3.2 10*3/uL (ref 0.7–4.0)
MCHC: 33.8 g/dL (ref 30.0–36.0)
MCV: 90.7 fl (ref 78.0–100.0)
Monocytes Absolute: 0.6 10*3/uL (ref 0.1–1.0)
Monocytes Relative: 7 % (ref 3.0–12.0)
Neutro Abs: 4.2 10*3/uL (ref 1.4–7.7)
Neutrophils Relative %: 51.2 % (ref 43.0–77.0)
Platelets: 375 10*3/uL (ref 150.0–400.0)
RBC: 3.77 Mil/uL — ABNORMAL LOW (ref 3.87–5.11)
RDW: 13.1 % (ref 11.5–15.5)
WBC: 8.3 10*3/uL (ref 4.0–10.5)

## 2024-07-23 LAB — LIPID PANEL
Cholesterol: 183 mg/dL (ref 28–200)
HDL: 52.8 mg/dL
LDL Cholesterol: 113 mg/dL — ABNORMAL HIGH (ref 10–99)
NonHDL: 129.88
Total CHOL/HDL Ratio: 3
Triglycerides: 85 mg/dL (ref 10.0–149.0)
VLDL: 17 mg/dL (ref 0.0–40.0)

## 2024-07-23 LAB — URINALYSIS
Bilirubin Urine: NEGATIVE
Hgb urine dipstick: NEGATIVE
Ketones, ur: NEGATIVE
Leukocytes,Ua: NEGATIVE
Nitrite: NEGATIVE
Specific Gravity, Urine: >=1.03 — AB (ref 1.000–1.030)
Total Protein, Urine: NEGATIVE
Urine Glucose: NEGATIVE
Urobilinogen, UA: 0.2 (ref 0.0–1.0)
pH: 5.5 (ref 5.0–8.0)

## 2024-07-23 LAB — TSH: TSH: 0.9 u[IU]/mL (ref 0.35–5.50)

## 2024-07-23 NOTE — Assessment & Plan Note (Signed)
 S/p ROBOTIC LOW ANTERIOR RECTOSIGMOID RESECTION WITH ANASTOMOSIS -ROBOTIC DRAINAGE OF PELVIC ABSCESS -INTRAOPERATIVE ASSESSMENT OF TISSUE VASCULAR PERFUSION USING ICG (indocyanine green) IMMUNOFLUORESCENCE - 10/2021

## 2024-07-23 NOTE — Progress Notes (Unsigned)
 "  Subjective:  Patient ID: Sheila Lin, female    DOB: [birth year]  Age: 64 y.o. MRN: [id number]  CC: Employment Physical   HPI Sheila Lin presents for a well exam  S/p ROBOTIC LOW ANTERIOR RECTOSIGMOID RESECTION WITH ANASTOMOSIS -ROBOTIC DRAINAGE OF PELVIC ABSCESS -INTRAOPERATIVE ASSESSMENT OF TISSUE VASCULAR PERFUSION USING ICG (indocyanine green) IMMUNOFLUORESCENCE - 10/2021  Outpatient Medications Prior to Visit  Medication Sig Dispense Refill   atenolol  (TENORMIN ) 50 MG tablet TAKE 1 TABLET BY MOUTH DAILY 30 tablet 8   cholecalciferol  (VITAMIN D3) 25 MCG (1000 UNIT) tablet Take 1,000 Units by mouth daily.     MULTIPLE VITAMIN PO Take 1 tablet by mouth daily.     Probiotic Product (Vetrano COLON HEALTH) CAPS Take 1 capsule by mouth daily.     ascorbic acid  (VITAMIN C ) 500 MG tablet Take 500 mg by mouth daily.     ferrous sulfate 324 MG TBEC Take 324 mg by mouth daily.     HYDROcodone -acetaminophen  (NORCO/VICODIN) 5-325 MG tablet Take 1-2 tablets by mouth every 4 (four) hours as needed for moderate pain (pain score 4-6). 30 tablet 0   polyethylene glycol (MIRALAX  / GLYCOLAX ) 17 g packet Take 17 g by mouth 2 (two) times daily. 14 each 0   No facility-administered medications prior to visit.    ROS: Review of Systems  Constitutional:  Negative for activity change, appetite change, chills, fatigue and unexpected weight change.  HENT:  Negative for congestion, mouth sores and sinus pressure.   Eyes:  Negative for visual disturbance.  Respiratory:  Negative for cough and chest tightness.   Gastrointestinal:  Negative for abdominal pain and nausea.  Genitourinary:  Negative for difficulty urinating, frequency and vaginal pain.  Musculoskeletal:  Negative for back pain and gait problem.  Skin:  Negative for pallor and rash.  Neurological:  Negative for dizziness, tremors, weakness, numbness and headaches.  Psychiatric/Behavioral:  Negative for confusion and sleep  disturbance.     Objective:  BP 118/70   Pulse 61   Temp 98.3 F (36.8 C) (Oral)   Ht 5' 8 (1.727 m)   Wt 173 lb (78.5 kg)   SpO2 98%   BMI 26.30 kg/m   BP Readings from Last 3 Encounters:  07/23/24 118/70  09/02/23 126/62  08/21/23 100/62    Wt Readings from Last 3 Encounters:  07/23/24 173 lb (78.5 kg)  08/31/23 170 lb 3.1 oz (77.2 kg)  08/21/23 159 lb (72.1 kg)    Physical Exam Constitutional:      General: She is not in acute distress.    Appearance: She is well-developed.  HENT:     Head: Normocephalic.     Right Ear: External ear normal.     Left Ear: External ear normal.     Nose: Nose normal.  Eyes:     General:        Right eye: No discharge.        Left eye: No discharge.     Conjunctiva/sclera: Conjunctivae normal.     Pupils: Pupils are equal, round, and reactive to light.  Neck:     Thyroid : No thyromegaly.     Vascular: No JVD.     Trachea: No tracheal deviation.  Cardiovascular:     Rate and Rhythm: Normal rate and regular rhythm.     Heart sounds: Normal heart sounds.  Pulmonary:     Effort: No respiratory distress.     Breath sounds: No stridor. No wheezing.  Abdominal:     General: Bowel sounds are normal. There is no distension.     Palpations: Abdomen is soft. There is no mass.     Tenderness: There is no abdominal tenderness. There is no guarding or rebound.  Musculoskeletal:        General: No tenderness.     Cervical back: Normal range of motion and neck supple. No rigidity.  Lymphadenopathy:     Cervical: No cervical adenopathy.  Skin:    Findings: No erythema or rash.  Neurological:     Cranial Nerves: No cranial nerve deficit.     Motor: No abnormal muscle tone.     Coordination: Coordination normal.     Deep Tendon Reflexes: Reflexes normal.  Psychiatric:        Behavior: Behavior normal.        Thought Content: Thought content normal.        Judgment: Judgment normal.     Lab Results  Component Value Date   WBC  23.2 (H) 08/31/2023   HGB 10.4 (L) 08/31/2023   HCT 33.7 (L) 08/31/2023   PLT 409 (H) 08/31/2023   GLUCOSE 129 (H) 08/31/2023   CHOL 186 10/22/2022   TRIG 75.0 10/22/2022   HDL 61.10 10/22/2022   LDLDIRECT 156.2 03/15/2010   LDLCALC 110 (H) 10/22/2022   ALT 17 08/02/2023   AST 18 08/02/2023   NA 137 08/31/2023   K 3.9 08/31/2023   CL 101 08/31/2023   CREATININE 0.99 08/31/2023   BUN 29 (H) 08/31/2023   CO2 26 08/31/2023   TSH 0.35 03/27/2023   HGBA1C 5.4 08/21/2023    No results found.  Assessment & Plan:   Problem List Items Addressed This Visit     Well adult exam   We discussed age appropriate health related issues, including available/recomended screening tests and vaccinations. Labs were ordered to be later reviewed . All questions were answered. We discussed one or more of the following - seat belt use, use of sunscreen/sun exposure exercise, safe sex, fall risk reduction, second hand smoke exposure, firearm use and storage, seat belt use, a need for adhering to healthy diet and exercise. Labs were ordered.  All questions were answered. Colon 2011, last 01/2021 - [facility] calcium  CT offered 2025, 2026 GYN q 2 years Derm - q 12 mo tDAP 2024 S/p ROBOTIC LOW ANTERIOR RECTOSIGMOID RESECTION WITH ANASTOMOSIS -ROBOTIC DRAINAGE OF PELVIC ABSCESS -INTRAOPERATIVE ASSESSMENT OF TISSUE VASCULAR PERFUSION USING ICG (indocyanine green) IMMUNOFLUORESCENCE - 10/2021      Relevant Orders   TSH   Urinalysis   CBC with Differential/Platelet   Lipid panel   Comprehensive metabolic panel with GFR   Vitamin D  deficiency   On Vit D      Sigmoid diverticulitis - Primary   S/p ROBOTIC LOW ANTERIOR RECTOSIGMOID RESECTION WITH ANASTOMOSIS -ROBOTIC DRAINAGE OF PELVIC ABSCESS -INTRAOPERATIVE ASSESSMENT OF TISSUE VASCULAR PERFUSION USING ICG (indocyanine green) IMMUNOFLUORESCENCE - 10/2021         No orders of the defined types were placed in this encounter.     Follow-up:  Return in about 1 year (around 07/23/2025) for a follow-up visit.  [name], MD "

## 2024-07-23 NOTE — Assessment & Plan Note (Signed)
 On Vit D

## 2024-07-23 NOTE — Patient Instructions (Signed)

## 2024-07-23 NOTE — Assessment & Plan Note (Addendum)
 We discussed age appropriate health related issues, including available/recomended screening tests and vaccinations. Labs were ordered to be later reviewed . All questions were answered. We discussed one or more of the following - seat belt use, use of sunscreen/sun exposure exercise, safe sex, fall risk reduction, second hand smoke exposure, firearm use and storage, seat belt use, a need for adhering to healthy diet and exercise. Labs were ordered.  All questions were answered. Colon 2011, last 01/2021 - [facility] calcium  CT offered 2025, 2026 GYN q 2 years Derm - q 12 mo tDAP 2024 S/p ROBOTIC LOW ANTERIOR RECTOSIGMOID RESECTION WITH ANASTOMOSIS -ROBOTIC DRAINAGE OF PELVIC ABSCESS -INTRAOPERATIVE ASSESSMENT OF TISSUE VASCULAR PERFUSION USING ICG (indocyanine green) IMMUNOFLUORESCENCE - 10/2021

## 2024-07-26 ENCOUNTER — Other Ambulatory Visit: Payer: Self-pay | Admitting: Internal Medicine

## 2024-07-27 ENCOUNTER — Ambulatory Visit: Payer: Self-pay | Admitting: Internal Medicine
